# Patient Record
Sex: Female | Born: 1955 | Race: White | Hispanic: No | Marital: Married | Smoking: Never smoker
Health system: Southern US, Community
[De-identification: ages and names within clinical notes are randomized; demographics above are authoritative.]

## PROBLEM LIST (undated history)

## (undated) DIAGNOSIS — Z9109 Other allergy status, other than to drugs and biological substances: Secondary | ICD-10-CM

## (undated) DIAGNOSIS — L853 Xerosis cutis: Secondary | ICD-10-CM

## (undated) DIAGNOSIS — J9621 Acute and chronic respiratory failure with hypoxia: Secondary | ICD-10-CM

## (undated) DIAGNOSIS — R3915 Urgency of urination: Secondary | ICD-10-CM

## (undated) DIAGNOSIS — K219 Gastro-esophageal reflux disease without esophagitis: Secondary | ICD-10-CM

## (undated) DIAGNOSIS — Z9889 Other specified postprocedural states: Secondary | ICD-10-CM

## (undated) DIAGNOSIS — E162 Hypoglycemia, unspecified: Secondary | ICD-10-CM

## (undated) DIAGNOSIS — R000 Tachycardia, unspecified: Secondary | ICD-10-CM

## (undated) DIAGNOSIS — I10 Essential (primary) hypertension: Secondary | ICD-10-CM

## (undated) DIAGNOSIS — M1990 Unspecified osteoarthritis, unspecified site: Secondary | ICD-10-CM

## (undated) DIAGNOSIS — U071 COVID-19: Secondary | ICD-10-CM

## (undated) DIAGNOSIS — J40 Bronchitis, not specified as acute or chronic: Secondary | ICD-10-CM

## (undated) DIAGNOSIS — R0602 Shortness of breath: Secondary | ICD-10-CM

## (undated) DIAGNOSIS — F32A Depression, unspecified: Secondary | ICD-10-CM

## (undated) DIAGNOSIS — Z86711 Personal history of pulmonary embolism: Secondary | ICD-10-CM

## (undated) DIAGNOSIS — E039 Hypothyroidism, unspecified: Secondary | ICD-10-CM

## (undated) DIAGNOSIS — A419 Sepsis, unspecified organism: Secondary | ICD-10-CM

## (undated) DIAGNOSIS — IMO0002 Reserved for concepts with insufficient information to code with codable children: Secondary | ICD-10-CM

## (undated) DIAGNOSIS — F329 Major depressive disorder, single episode, unspecified: Secondary | ICD-10-CM

## (undated) DIAGNOSIS — F410 Panic disorder [episodic paroxysmal anxiety] without agoraphobia: Secondary | ICD-10-CM

## (undated) DIAGNOSIS — J1282 Pneumonia due to coronavirus disease 2019: Secondary | ICD-10-CM

## (undated) DIAGNOSIS — J302 Other seasonal allergic rhinitis: Secondary | ICD-10-CM

## (undated) DIAGNOSIS — R6520 Severe sepsis without septic shock: Secondary | ICD-10-CM

## (undated) DIAGNOSIS — J189 Pneumonia, unspecified organism: Secondary | ICD-10-CM

## (undated) DIAGNOSIS — R112 Nausea with vomiting, unspecified: Secondary | ICD-10-CM

## (undated) HISTORY — DX: Panic disorder (episodic paroxysmal anxiety): F41.0

## (undated) HISTORY — DX: Reserved for concepts with insufficient information to code with codable children: IMO0002

## (undated) HISTORY — DX: Essential (primary) hypertension: I10

## (undated) HISTORY — PX: APPENDECTOMY: SHX54

## (undated) HISTORY — PX: VAGINAL HYSTERECTOMY: SUR661

## (undated) HISTORY — DX: Unspecified osteoarthritis, unspecified site: M19.90

## (undated) HISTORY — PX: HERNIA REPAIR: SHX51

## (undated) HISTORY — PX: CHOLECYSTECTOMY: SHX55

---

## 2007-08-02 ENCOUNTER — Ambulatory Visit (HOSPITAL_COMMUNITY): Admission: RE | Admit: 2007-08-02 | Discharge: 2007-08-02 | Payer: Self-pay | Admitting: General Practice

## 2009-09-03 ENCOUNTER — Ambulatory Visit: Payer: Self-pay | Admitting: Critical Care Medicine

## 2009-09-03 DIAGNOSIS — E785 Hyperlipidemia, unspecified: Secondary | ICD-10-CM | POA: Insufficient documentation

## 2009-09-03 DIAGNOSIS — R918 Other nonspecific abnormal finding of lung field: Secondary | ICD-10-CM | POA: Insufficient documentation

## 2009-09-03 DIAGNOSIS — IMO0002 Reserved for concepts with insufficient information to code with codable children: Secondary | ICD-10-CM | POA: Insufficient documentation

## 2009-09-03 DIAGNOSIS — I10 Essential (primary) hypertension: Secondary | ICD-10-CM | POA: Insufficient documentation

## 2009-09-03 DIAGNOSIS — F410 Panic disorder [episodic paroxysmal anxiety] without agoraphobia: Secondary | ICD-10-CM | POA: Insufficient documentation

## 2009-09-19 ENCOUNTER — Encounter: Payer: Self-pay | Admitting: Critical Care Medicine

## 2009-12-09 ENCOUNTER — Encounter: Admission: RE | Admit: 2009-12-09 | Discharge: 2009-12-09 | Payer: Self-pay | Admitting: Urology

## 2010-10-25 HISTORY — PX: OTHER SURGICAL HISTORY: SHX169

## 2010-12-22 ENCOUNTER — Telehealth (INDEPENDENT_AMBULATORY_CARE_PROVIDER_SITE_OTHER): Payer: Self-pay | Admitting: *Deleted

## 2010-12-31 NOTE — Progress Notes (Signed)
Summary: NEEDS LAST OV NOTE FAXED TO [phone]  Phone Note From Other Clinic   Caller: AMY Call For: WRIGHT Caller: AMY WITH DR Evelene Croon Call For: WRIGHT Summary of Call: AMY WITH DR Evelene Croon PHONED AND WOULD LIKE [facility] LAST OFFICE VISIT NOTES FAXED TO [phone] Initial call taken by: Vedia Coffer,  December 22, 2010 3:22 PM  Follow-up for Phone Call        Faxed note.//Juanita Follow-up by: Darletta Moll,  December 23, 2010 8:25 AM

## 2011-09-30 ENCOUNTER — Other Ambulatory Visit (HOSPITAL_COMMUNITY): Payer: Self-pay | Admitting: Family Medicine

## 2011-09-30 DIAGNOSIS — J984 Other disorders of lung: Secondary | ICD-10-CM

## 2011-10-07 ENCOUNTER — Encounter: Payer: Self-pay | Admitting: Pulmonary Disease

## 2011-10-07 ENCOUNTER — Ambulatory Visit (INDEPENDENT_AMBULATORY_CARE_PROVIDER_SITE_OTHER): Payer: Medicare Other | Admitting: Pulmonary Disease

## 2011-10-07 ENCOUNTER — Telehealth: Payer: Self-pay | Admitting: Critical Care Medicine

## 2011-10-07 VITALS — BP 130/82 | HR 83 | Temp 97.9°F | Ht 67.0 in | Wt 205.0 lb

## 2011-10-07 DIAGNOSIS — J984 Other disorders of lung: Secondary | ICD-10-CM

## 2011-10-07 NOTE — Telephone Encounter (Signed)
Error.Marie Chavez ° °

## 2011-10-07 NOTE — Progress Notes (Deleted)
Subjective:     Patient ID: Marie Chavez, female   DOB: 1956-06-18, 55 y.o.   MRN: [id number]  HPI   Review of Systems  Psychiatric/Behavioral: Positive for dysphoric mood. The patient is nervous/anxious.   Anxiety/Depression     Objective:   Physical Exam     Assessment:     ***    Plan:     ***

## 2011-10-07 NOTE — Patient Instructions (Signed)
Will get disk with PET scan imaging and call with results Will schedule breathing test (PFT) Follow up in 3 months

## 2011-10-07 NOTE — Assessment & Plan Note (Addendum)
She had left upper lobe mass first noted on chest imaging studies in 2008.  She has PET scan from 2008, 2010, and 2012 which are negative for malignant uptake.  There is no significant history of smoking.  I have asked her to get a copy of the disk for her most recent PET scan from earlier this month so that I can compare the images directly with prior studies.  Will also arrange for pulmonary function tests.  Explained that if there is still concern for possible malignancy then she would either need to have bronchoscopy with biopsy versus evaluation by thoracic surgery.  If in comparison to prior studies her most recent PET scan is equivocal regarding potential for malignancy, then she may need to radiographic monitoring.

## 2011-10-07 NOTE — Progress Notes (Signed)
Chief Complaint  Patient presents with  . Advice Only    Pt last seen PW 2010. Pt had CT done last week and her nodule was bigger.     History of Present Illness:  Marie Chavez is a 55 y.o. female for evaluation of abnormal CT chest.  She had an episode of bronchitis recently.  As a result she had a chest xray, and was noted to have a mass in the left upper lung.  She then had a CT chest.  This was compared to CT chest in 2010, and showed slight increase in size raising concern for slow growing malignancy.  As a result she had PET scan on 10/04/11 which is reported to not show malignant uptake with maximum SUV of 1.86 FDG.  The lesion was noted to be 2.5 x 2 cm.  She was evaluated in 2010 by Dr. Shan Chavez for same lesion.  At that time she had fallen and hurt her chest.  She had a chest xray which showed left lung mass.  PET scan from 08/28/09 showed 2.3 cm lobulated mass without malignant uptake.  This was compared to PET scan from 08/02/07.  This was also negative for malignant uptake.  There was concern that lesion could still represent slow growing malignancy, and she was advised to have re-evaluation by pulmonary medicine.  She currently denies any respiratory symptoms of cough, wheeze, sputum, chest pain, or hemoptysis.  She has not had recent fever, sweats, or weight loss.  She denies headaches, change in vision, or change in voice.  She used to work in housekeeping.  She is currently disabled.  There is no history of pneumonia or tuberculosis.  She is from [place], and denies any recent travel history.  She has a Emergency planning/management officer, but no other animal exposures.  She denies any recent sick exposures.  She tried smoking briefly as a teenager.  Past Medical History  Diagnosis Date  . Hypertension   . Panic disorder   . Herniated disc   . DJD (degenerative joint disease)     Past Surgical History  Procedure Date  . Cholecystectomy   . Vaginal hysterectomy   . Hernia repair       No current outpatient prescriptions on file prior to visit.    Allergies  Allergen Reactions  . Celecoxib     REACTION: heart rate increases, itching  . Prednisone     REACTION: Heart rate increases, itching    family history includes Breast cancer in her sister.   reports that she has never smoked. She does not have any smokeless tobacco history on file.  Review of Systems - 12 point ROS negative except above.  Blood pressure 130/82, pulse 83, temperature 97.9 F (36.6 C), temperature source Oral, height 5\' 7"  (1.702 m), weight 205 lb (92.987 kg), SpO2 91.00%. Body mass index is 32.11 kg/(m^2).  Physical Exam:  General - Obese HEENT - PERRLA, EOMI, no sinus tenderness, no oral exudate, no LAN, no thyromegaly Cardiac - s1s2 regular, no murmur Chest - good air entry, normal respiratory excursion, no wheeze/rales Abdomen - soft, non tender, no organomegaly Extremities - no e/c/c Neurologic - normal strength, CN intact Skin - no rashes Psychiatric - normal mood, behavior  Assessment/Plan:    Outpatient Encounter Prescriptions as of 10/07/2011  Medication Sig Dispense Refill  . atenolol (TENORMIN) 50 MG tablet Take 50 mg by mouth daily.        . clonazePAM (KLONOPIN) 1 MG tablet  Take 1 mg by mouth 2 (two) times daily as needed.        Marland Kitchen esomeprazole (NEXIUM) 40 MG capsule Take 40 mg by mouth daily before breakfast.        . estradiol (VIVELLE-DOT) 0.05 MG/24HR Place 1 patch onto the skin once a week.        Marland Kitchen imipramine (TOFRANIL) 50 MG tablet Take 50 mg by mouth 2 (two) times daily.        Marland Kitchen levothyroxine (SYNTHROID, LEVOTHROID) 175 MCG tablet Take 175 mcg by mouth daily.          Marie Chavez Pager:  [phone] 10/07/2011, 3:45 PM

## 2011-10-22 ENCOUNTER — Telehealth: Payer: Self-pay | Admitting: Pulmonary Disease

## 2011-10-22 NOTE — Telephone Encounter (Signed)
PET scan from [facility] reviewed.  No malignant uptake in left upper lobe lesion, and lesion measures about same size compared to PET scan from 2008.  Results d/w patient.  Will continue with plan for repeat CT chest in 3 months, but advised that biopsy is not needed at this time.

## 2012-01-10 ENCOUNTER — Ambulatory Visit: Payer: Medicare Other | Admitting: Pulmonary Disease

## 2012-01-18 ENCOUNTER — Ambulatory Visit: Payer: Medicare Other | Admitting: Pulmonary Disease

## 2012-02-08 ENCOUNTER — Ambulatory Visit: Payer: Medicare Other | Admitting: Pulmonary Disease

## 2013-10-15 ENCOUNTER — Telehealth: Payer: Self-pay | Admitting: Pulmonary Disease

## 2013-10-15 NOTE — Telephone Encounter (Signed)
Pt has been scheduled to see TP on 10/24/13 at 11:15am.

## 2013-10-24 ENCOUNTER — Ambulatory Visit (INDEPENDENT_AMBULATORY_CARE_PROVIDER_SITE_OTHER): Payer: Medicare Other | Admitting: Adult Health

## 2013-10-24 ENCOUNTER — Encounter (INDEPENDENT_AMBULATORY_CARE_PROVIDER_SITE_OTHER): Payer: Self-pay

## 2013-10-24 ENCOUNTER — Encounter: Payer: Self-pay | Admitting: Adult Health

## 2013-10-24 VITALS — BP 122/70 | HR 64 | Temp 97.3°F | Ht 65.0 in | Wt 211.2 lb

## 2013-10-24 DIAGNOSIS — J984 Other disorders of lung: Secondary | ICD-10-CM

## 2013-10-24 NOTE — Patient Instructions (Addendum)
Finish antibiotics.  Mucinex DM Twice daily  As needed  Cough, congestion  Fluids and rest  follow up for  CT chest next week.  follow up Dr. Craige Cotta  After CT  Please contact office for sooner follow up if symptoms do not improve or worsen or seek emergency care

## 2013-10-24 NOTE — Progress Notes (Signed)
Subjective:    Patient ID: Marie Chavez, female    DOB: Aug 25, 1956, 57 y.o.   MRN: [id number]  HPI 57 yo female   10/24/2013 ER follow up  Marie Chavez is a 57 y.o. female for evaluation of abnormal xray  10/24/2013 ER follow up  Was seen in ER  Last week for bronchitis , started on abx . CXR showed enlarging mass. Last CT chest was 06/2012 with slightly increased mass . Pt was seen last in our office 10/06/13 for abn CT scan  CT chest in 2010, and showed slight increase in size raising concern for slow growing malignancy. As a result she had PET scan on 10/04/11 which is reported to not show malignant uptake with maximum SUV of 1.86 FDG. The lesion was noted to be 2.5 x 2 cm.  She was evaluated in 2010 by Dr. Shan Levans for same lesion. At that time she had fallen and hurt her chest. She had a chest xray which showed left lung mass. PET scan from 08/28/09 showed 2.3 cm lobulated mass without malignant uptake. This was compared to PET scan from 08/02/07. This was also negative for malignant uptake.   She has CT set up for next week on 10/29/12 . No weight  Loss, hemopytisis , chest pain or orthopnea.  She is never smoker.     Review of Systems Constitutional:   No  weight loss, night sweats,  Fevers, chills, fatigue, or  lassitude.  HEENT:   No headaches,  Difficulty swallowing,  Tooth/dental problems, or  Sore throat,                No sneezing, itching, ear ache, nasal congestion, post nasal drip,   CV:  No chest pain,  Orthopnea, PND, swelling in lower extremities, anasarca, dizziness, palpitations, syncope.   GI  No heartburn, indigestion, abdominal pain, nausea, vomiting, diarrhea, change in bowel habits, loss of appetite, bloody stools.   Resp: No shortness of breath with exertion or at rest.  No excess mucus, no productive cough,  No non-productive cough,  No coughing up of blood.  No change in color of mucus.  No wheezing.  No chest wall deformity  Skin: no rash or  lesions.  GU: no dysuria, change in color of urine, no urgency or frequency.  No flank pain, no hematuria   MS:  No joint pain or swelling.  No decreased range of motion.  No back pain.  Psych:  No change in mood or affect. No depression or anxiety.  No memory loss.         Objective:   Physical Exam  GEN: A/Ox3; pleasant , NAD, well nourished   HEENT:  Klein/AT,  EACs-clear, TMs-wnl, NOSE-clear, THROAT-clear, no lesions, no postnasal drip or exudate noted.   NECK:  Supple w/ fair ROM; no JVD; normal carotid impulses w/o bruits; no thyromegaly or nodules palpated; no lymphadenopathy.  RESP  Clear  P & A; w/o, wheezes/ rales/ or rhonchi.no accessory muscle use, no dullness to percussion  CARD:  RRR, no m/r/g  , no peripheral edema, pulses intact, no cyanosis or clubbing.  GI:   Soft & nt; nml bowel sounds; no organomegaly or masses detected.  Musco: Warm bil, no deformities or joint swelling noted.   Neuro: alert, no focal deficits noted.    Skin: Warm, no lesions or rashes  06/2012 CT CHEST Central left upper lobe nodule today measures 2.7 x 2.1<BR>cm. Previously this measured 2.6 x 2.0  cm. It has slightly<BR>enlarged. Lungs are otherwise clear.<BR>      Assessment & Plan:

## 2013-10-24 NOTE — Assessment & Plan Note (Signed)
Will follow left lung mass on CT chest planned for next week.  After CT decide on next step with Dr. Craige Cotta    Plan   Finish antibiotics.  Mucinex DM Twice daily  As needed  Cough, congestion  Fluids and rest  follow up for  CT chest next week.  follow up Dr. Craige Cotta  After CT  Please contact office for sooner follow up if symptoms do not improve or worsen or seek emergency care

## 2013-10-28 NOTE — Progress Notes (Signed)
Reviewed and agree with assessment/plan. 

## 2013-10-29 DIAGNOSIS — J479 Bronchiectasis, uncomplicated: Secondary | ICD-10-CM | POA: Diagnosis not present

## 2013-10-29 DIAGNOSIS — R911 Solitary pulmonary nodule: Secondary | ICD-10-CM | POA: Diagnosis not present

## 2013-10-30 ENCOUNTER — Encounter: Payer: Self-pay | Admitting: Pulmonary Disease

## 2013-10-30 ENCOUNTER — Ambulatory Visit (INDEPENDENT_AMBULATORY_CARE_PROVIDER_SITE_OTHER): Payer: Medicare Other | Admitting: Pulmonary Disease

## 2013-10-30 VITALS — BP 122/82 | HR 74 | Temp 98.5°F | Ht 65.0 in | Wt 211.0 lb

## 2013-10-30 DIAGNOSIS — T465X5A Adverse effect of other antihypertensive drugs, initial encounter: Secondary | ICD-10-CM

## 2013-10-30 DIAGNOSIS — R058 Other specified cough: Secondary | ICD-10-CM

## 2013-10-30 DIAGNOSIS — T464X5A Adverse effect of angiotensin-converting-enzyme inhibitors, initial encounter: Secondary | ICD-10-CM

## 2013-10-30 DIAGNOSIS — R918 Other nonspecific abnormal finding of lung field: Secondary | ICD-10-CM

## 2013-10-30 DIAGNOSIS — R059 Cough, unspecified: Secondary | ICD-10-CM

## 2013-10-30 DIAGNOSIS — R05 Cough: Secondary | ICD-10-CM | POA: Diagnosis not present

## 2013-10-30 DIAGNOSIS — R222 Localized swelling, mass and lump, trunk: Secondary | ICD-10-CM | POA: Diagnosis not present

## 2013-10-30 NOTE — Progress Notes (Signed)
Chief Complaint  Patient presents with  . Pulmonary Nodule    Breathing is unchanged. Reports becoming short of breath when she bends over. Cough has been present for 4 weeks, productive of yellow/clear mucus.    History of Present Illness: Marie Chavez is a 58 y.o. female never smoker with lung mass.  I last saw Marie Chavez in December 2012 for lung mass.  She was mostly recently seen by Rexene Edison in December 2014.  This was after she had ER evaluation and CXR showed increased size of lung mass.  She has noticed a cough for the past several weeks.  She sometimes brings up clear to yellow sputum.  She denies fever, wheeze, or hemoptysis.  She feels sore in her ribs from coughing.  She reports that she was started on lisinopril one month ago, and it was shortly after that when she started to develop a cough.  She had CT chest from 10/29/13 which showed some increase in size of Lt perihilar mass.  TESTS: CT chest 06/19/07 >> 1.6 cm Lt upper lobe nodule PET scan 08/02/07 >> no malignant uptake PET scan 08/28/09 >> 2.3 cm Lt perihilar mass w/o malignant uptake PET scan 10/04/11 >> 2.5 x 2 cm Lt perihilar mass w/o malignant uptake CT chest 10/29/13 >> 2.6 cm Lt perihilar smooth mass, LUL BTX with plugging and ATX, 3 mm RLL nodule  Marie Chavez  has a past medical history of Hypertension; Panic disorder; Herniated disc; and DJD (degenerative joint disease).  Marie Chavez  has past surgical history that includes Cholecystectomy; Vaginal hysterectomy; and Hernia repair.  Prior to Admission medications   Medication Sig Start Date End Date Taking? Authorizing Provider  atenolol (TENORMIN) 50 MG tablet Take 50 mg by mouth daily.     Yes Historical Provider, MD  clonazePAM (KLONOPIN) 1 MG tablet Take 1 mg by mouth 2 (two) times daily as needed.     Yes Historical Provider, MD  EPIPEN 2-PAK 0.3 MG/0.3ML SOAJ injection As needed 10/11/13  Yes Historical Provider, MD  escitalopram (LEXAPRO)  10 MG tablet Once daily 10/02/13  Yes Historical Provider, MD  esomeprazole (NEXIUM) 40 MG capsule Take 40 mg by mouth daily before breakfast.     Yes Historical Provider, MD  estradiol (VIVELLE-DOT) 0.05 MG/24HR Place 1 patch onto the skin once a week.     Yes Historical Provider, MD  fluticasone Asencion Islam) 50 MCG/ACT nasal spray As needed 10/13/13  Yes Historical Provider, MD  gabapentin (NEURONTIN) 300 MG capsule One at bedtime 10/02/13  Yes Historical Provider, MD  imipramine (TOFRANIL) 50 MG tablet Take 50 mg by mouth 2 (two) times daily.     Yes Historical Provider, MD  levothyroxine (SYNTHROID, LEVOTHROID) 175 MCG tablet Take 175 mcg by mouth daily.     Yes Historical Provider, MD  lisinopril (PRINIVIL,ZESTRIL) 5 MG tablet Once daily 09/06/13  Yes Historical Provider, MD  nabumetone (RELAFEN) 750 MG tablet As needed 10/10/13  Yes Historical Provider, MD    Allergies  Allergen Reactions  . Celecoxib     REACTION: heart rate increases, itching  . Prednisone     REACTION: Heart rate increases, itching     Physical Exam:  General - No distress ENT - No sinus tenderness, no oral exudate, no LAN Cardiac - s1s2 regular, no murmur Chest - No wheeze/rales/dullness Back - No focal tenderness Abd - Soft, non-tender Ext - No edema Neuro - Normal strength Skin - No rashes Psych - normal  mood, and behavior   Assessment/Plan:  Chesley Mires, MD [place] [facility] Pager:  [phone]

## 2013-10-30 NOTE — Patient Instructions (Signed)
Talk to your primary doctor about stopping lisinopril with concern for cough Will schedule PET scan and call with results Follow up in 6 weeks

## 2013-11-06 DIAGNOSIS — M5030 Other cervical disc degeneration, unspecified cervical region: Secondary | ICD-10-CM | POA: Diagnosis not present

## 2013-11-06 DIAGNOSIS — M9981 Other biomechanical lesions of cervical region: Secondary | ICD-10-CM | POA: Diagnosis not present

## 2013-11-06 DIAGNOSIS — M999 Biomechanical lesion, unspecified: Secondary | ICD-10-CM | POA: Diagnosis not present

## 2013-11-06 DIAGNOSIS — M5137 Other intervertebral disc degeneration, lumbosacral region: Secondary | ICD-10-CM | POA: Diagnosis not present

## 2013-11-07 DIAGNOSIS — R05 Cough: Secondary | ICD-10-CM | POA: Insufficient documentation

## 2013-11-07 DIAGNOSIS — T464X5A Adverse effect of angiotensin-converting-enzyme inhibitors, initial encounter: Secondary | ICD-10-CM

## 2013-11-07 NOTE — Assessment & Plan Note (Signed)
She has developed persistent, dry cough since being started on lisinopril.  I explained to her how ACE inhibitors can be cause of her cough, and advised her to d/w her PCP about alternative anti-hypertensive medications.

## 2013-11-07 NOTE — Assessment & Plan Note (Signed)
She has left perihilar lesion first noticed on CT chest from 2008 >> 1.6 cm at that time.  Most recent CT chest imaging shows increase in size to 2.6 cm.  Will repeat PET scan.  Explained she will need tissue sampling at a minimal, and possible need thoracic surgery evaluation for resection of lesion.  Will eventually need PFT's to further assess lung function prior to thoracic surgery evaluation.  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

## 2013-11-08 ENCOUNTER — Telehealth: Payer: Self-pay | Admitting: Pulmonary Disease

## 2013-11-08 ENCOUNTER — Ambulatory Visit (HOSPITAL_COMMUNITY)
Admission: RE | Admit: 2013-11-08 | Discharge: 2013-11-08 | Disposition: A | Discharge status: Home / Self Care | Payer: Medicare Other | Source: Ambulatory Visit | Attending: Pulmonary Disease | Admitting: Pulmonary Disease

## 2013-11-08 DIAGNOSIS — R222 Localized swelling, mass and lump, trunk: Secondary | ICD-10-CM | POA: Insufficient documentation

## 2013-11-08 DIAGNOSIS — R918 Other nonspecific abnormal finding of lung field: Secondary | ICD-10-CM

## 2013-11-08 DIAGNOSIS — R911 Solitary pulmonary nodule: Secondary | ICD-10-CM | POA: Diagnosis not present

## 2013-11-08 LAB — GLUCOSE, CAPILLARY: Glucose-Capillary: 89 mg/dL (ref 70–99)

## 2013-11-08 MED ORDER — FLUDEOXYGLUCOSE F - 18 (FDG) INJECTION: 17.3000 | Freq: Once | INTRAVENOUS | Status: AC | PRN

## 2013-11-08 NOTE — Telephone Encounter (Signed)
11/08/2013    CLINICAL DATA:  Initial treatment strategy for lung mass.   EXAM: NUCLEAR MEDICINE PET SKULL BASE TO THIGH   FASTING BLOOD GLUCOSE:  Value: 89mg /dl   TECHNIQUE: 17.3 mCi F-18 FDG was injected intravenously. CT data was obtained and used for attenuation correction and anatomic localization only. (This was not acquired as a diagnostic CT examination.) Additional exam technical data entered on technologist worksheet. COMPARISON:  NM PET IMAGE SKULL BASE TO THIGH dated 08/02/2007; CT CHEST W/O CM dated 10/29/2013; CT CHEST W/O CM dated 07/11/2012; NM PET TUM IMG SKULL BASE T - THIGH dated 10/04/2011   FINDINGS:  NECK   No hypermetabolic lymph nodes in the neck.   CHEST   Rounded smoothly marginated left upper lobe nodule demonstrates very low metabolic activity SUV max 2.5. This nodule measures 22 x 20 mm (image 78, series 2) increased from 15 x 15 mm on PET-CT of 08/02/2007. Compared to the most recent CT scans the lesion actually measures smaller which may be due to differing technique. This recent CT scan measured lesion at 26 x 21 mm (11/08/2013). Small peripheral nodule in the left upper lobe measures 6 mm compared to 11 mm on most recent CT scan. This does not have measurable metabolic activity.  There are no hypermetabolic mediastinal lymph nodes.   ABDOMEN/PELVIS   No abnormal hypermetabolic activity within the liver, pancreas, adrenal glands, or spleen. No hypermetabolic lymph nodes in the abdomen or pelvis.   SKELETON   No focal hypermetabolic activity to suggest skeletal metastasis.   IMPRESSION:  1. Very low metabolic activity associated with the enlarging left upper lobe rounded nodule. 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  2. Small peripheral nodule in the left upper lobe has no metabolic activity and is new from the remote PET scan.  3. No mediastinal hypermetabolic lymph nodes or distant metastasis.    Electronically Signed   By: Suzy Bouchard M.D.   On:  11/08/2013 11:47    Results d/w pt over phone.  Will arrange for PFT and referral to thoracic surgery.

## 2013-11-13 DIAGNOSIS — R05 Cough: Secondary | ICD-10-CM | POA: Diagnosis not present

## 2013-11-13 DIAGNOSIS — T8160XA Unspecified acute reaction to foreign substance accidentally left during a procedure, initial encounter: Secondary | ICD-10-CM | POA: Diagnosis not present

## 2013-11-13 DIAGNOSIS — I10 Essential (primary) hypertension: Secondary | ICD-10-CM | POA: Diagnosis not present

## 2013-11-13 DIAGNOSIS — R059 Cough, unspecified: Secondary | ICD-10-CM | POA: Diagnosis not present

## 2013-11-14 ENCOUNTER — Ambulatory Visit (INDEPENDENT_AMBULATORY_CARE_PROVIDER_SITE_OTHER): Payer: Medicare Other | Admitting: Cardiothoracic Surgery

## 2013-11-14 ENCOUNTER — Encounter: Payer: Self-pay | Admitting: Cardiothoracic Surgery

## 2013-11-14 VITALS — BP 139/79 | HR 66 | Resp 18 | Ht 65.0 in | Wt 210.0 lb

## 2013-11-14 DIAGNOSIS — R911 Solitary pulmonary nodule: Secondary | ICD-10-CM

## 2013-11-14 DIAGNOSIS — J984 Other disorders of lung: Secondary | ICD-10-CM

## 2013-11-14 NOTE — Progress Notes (Signed)
[id number]       , [id number]             [phone]                    Amneet M Harkey [place] Medical Record #[id number] Date of Birth: 21-Mar-1956  Referring: Chesley Mires, MD Primary Care: Christ Kick, MD  Chief Complaint:    Chief Complaint  Patient presents with  . Lung Lesion    eval and treat.Marland KitchenMarland KitchenPET 10/29/13    History of Present Illness:    Marie Chavez 58 y.o. female is seen in the office  today for a slowly enlarging left upper lobe central lung mass at the request of Dr. Llana Aliment. The patient notes a recent upper respiratory infection and at the time of evaluation a chest x-ray was done in the left upper lobe lung mass was noted patient was referred for further evaluation. With the upper respiratory infection 3-4 weeks ago she had some blood tinged sputum, she described as more streaks this is since cleared. She's had no weight loss, no fever chills. She's a nonsmoker.  The lesion in question has been present since 2008. At that time she fell in the tub at home suffering chest injury and a 1.5 cm left upper lobe lung mass was noted. She was referred to Dr. Lyda Jester ,  no biopsy was obtained and the patient was lost to follow up.  The repeat CT scans were performed in 2013 and 2012. A PET scan was performed in 2008.      Current Activity/ Functional Status:  Patient is independent with mobility/ambulation, transfers, ADL's, IADL's. patient is disabled because of chronic back pain  Zubrod Score: At the time of surgery this patient's most appropriate activity status/level should be described as: []  Normal activity, no symptoms [x]  Symptoms, fully ambulatory []  Symptoms, in bed less than or equal to 50% of the time []  Symptoms, in bed greater than 50% of the time but less than 100% []  Bedridden []  Moribund   Past Medical History  Diagnosis Date  . Hypertension   . Panic disorder   . Herniated disc   . DJD (degenerative  joint disease)     Past Surgical History  Procedure Laterality Date  . Cholecystectomy    . Vaginal hysterectomy    . Hernia repair      Family History  Problem Relation Age of Onset  . Breast cancer Sister    father died at age 56 with diabetes and renal failure, mother is alive at age 50 and "healthy". Patient has 6 sisters, one with history of breast cancer 2 with thyroid disease one with ovarian cancer one with brain aneurysm  History   Social History  . Marital Status: Married    Spouse Name: N/A    Number of Children: N/A  . Years of Education: N/A   Occupational History  . disabled    Social History Main Topics  . Smoking status: Never Smoker   . Smokeless tobacco: Not on file  . Alcohol Use: Not on file  . Drug Use: Not on file  . Sexual Activity: Not on file      History  Smoking status  . Never Smoker   Smokeless tobacco  . Not on file    History  Alcohol Use:  none      Allergies  Allergen Reactions  . Celecoxib  REACTION: heart rate increases, itching  . Prednisone     REACTION: Heart rate increases, itching    Current Outpatient Prescriptions  Medication Sig Dispense Refill  . atenolol (TENORMIN) 50 MG tablet Take 50 mg by mouth daily.        . clonazePAM (KLONOPIN) 1 MG tablet Take 1 mg by mouth 2 (two) times daily as needed.        Marland Kitchen EPIPEN 2-PAK 0.3 MG/0.3ML SOAJ injection As needed      . escitalopram (LEXAPRO) 10 MG tablet Once daily      . esomeprazole (NEXIUM) 40 MG capsule Take 40 mg by mouth daily before breakfast.        . estradiol (VIVELLE-DOT) 0.05 MG/24HR Place 1 patch onto the skin once a week.        . fluticasone (FLONASE) 50 MCG/ACT nasal spray As needed      . gabapentin (NEURONTIN) 300 MG capsule One at bedtime      . levothyroxine (SYNTHROID, LEVOTHROID) 175 MCG tablet Take 175 mcg by mouth daily.        Marland Kitchen lisinopril (PRINIVIL,ZESTRIL) 5 MG tablet Once daily      . nabumetone (RELAFEN) 750 MG tablet As needed         No current facility-administered medications for this visit.       Review of Systems:     Cardiac Review of Systems: Y or N  Chest Pain [ n   ]  Resting SOB [ n  ] Exertional SOB  [ y ]  Vertell Limber Florencio.Farrier  ]   Pedal Edema [ n  ]    Palpitations [n  ] Syncope  [n  ]   Presyncope [  n ]  General Review of Systems: [Y] = yes [  ]=no Constitional: recent weight change [n  ];  Wt loss over the last 3 months [  0 ] anorexia [  ]; fatigue [  ]; nausea [  ]; night sweats [ n ]; fever [ n ]; or chills [n  ];          Dental: poor dentition[ n ]; Last Dentist visit:   Eye : blurred vision [  ]; diplopia [   ]; vision changes [  ];  Amaurosis fugax[  ]; Resp: cough Blue.Reese  ];  wheezing[y  ];  hemoptysis[ y ]; shortness of breath[  ]; paroxysmal nocturnal dyspnea[  ]; dyspnea on exertion[ y ]; or orthopnea[  ];  GI:  gallstones[  ], vomiting[  ];  dysphagia[  ]; melena[  ];  hematochezia [  ]; heartburn[  ];   Hx of  Colonoscopy[y  ]; GU: kidney stones [  ]; hematuria[  ];   dysuria [  ];  nocturia[  ];  history of     obstruction [  ]; urinary frequency [  ]             Skin: rash, swelling[  ];, hair loss[  ];  peripheral edema[  ];  or itching[  ]; Musculosketetal: myalgias[  ];  joint swelling[ y ];  joint erythema[ y ];  joint pain[  ];  back pain[  ];  Heme/Lymph: bruising[  ];  bleeding[  ];  anemia[  ];  Neuro: TIA[  ];  headaches[  ];  stroke[  ];  vertigo[  ];  seizures[ n ];   paresthesias[n];  difficulty walking[n  ];  Psych:depression[  ]; anxiety[yes  ];  Endocrine:  diabetes[ n ];  thyroid dysfunction[y  ];  Immunizations: Flu up to date [ y ]; Pneumococcal up to date Blue.Reese  ];  Other:  Physical Exam: BP 139/79  Pulse 66  Resp 18  Ht 5\' 5"  (1.651 m)  Wt 210 lb (95.255 kg)  BMI 34.95 kg/m2  SpO2 97%    General appearance: alert, cooperative, appears stated age and no distress Neurologic: intact Heart: regular rate and rhythm, S1, S2 normal, no murmur, click, rub or gallop Lungs:  clear to auscultation bilaterally Abdomen: soft, non-tender; bowel sounds normal; no masses,  no organomegaly Extremities: extremities normal, atraumatic, no cyanosis or edema and Homans sign is negative, no sign of DVT Patient has no cervical or supraclavicular adenopathy, she has no carotid bruits, she has full DP and PT pulses bilaterally   Diagnostic Studies & Laboratory data:     Recent Radiology Findings:  On serial measurements by me CT scan 2008 August showed left upper lobe mass 16 mm in size, 2012 the mass and increased to 26 mm, 2013 was measured at 27.90mm, most recent scan in 2015 27 millimeters The PET scan in 2008 and 2015 show very low metabolic activity. She also has a new lesion peripherally left upper lobe. CT 2015: CT CHEST WITHOUT CONTRAST  TECHNIQUE: Multidetector CT imaging of the chest was performed following the standard protocol without IV contrast.  COMPARISON: DG CHEST 2V dated 10/13/2013; CT CHEST W/O CM dated 07/11/2012; CT CHEST W/ CM dated 09/27/2011; CT CHEST W/ CM dated 06/19/2007; NM PET IMAGE SKULL BASE TO THIGH dated 08/02/2007  FINDINGS: Left smoothly marginated suprahilar pulmonary nodule, 2.1 x 2.6 cm on image 20 of series 4, formerly 2.0 x 2.6 cm on the 2012 examination.  Tubular structure extending distally from this nodule probably represents with a plugged bronchiectatic airway and appears to branch with nodularity in the peripheral lung. The degree of bronchiectasis and the peripheral lobular branching head increased since the prior examination from 07/11/2012, and the posterior peripheral branching terminates in a 1.2 cm nodule (image 14 of series 4).  No pathologic thoracic adenopathy. 3 mm right lower lobe nodule, image 34 of series 4, no change from 09/27/2011, considered benign.  Old healed left rib fractures.  IMPRESSION: 1. Slow growing sharply marginated central left upper lobe pulmonary nodule has increased from 1.6 cm in  diameter in 2008, to 2.6 cm in diameter today. 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 There is worsening bronchiectasis of distal airways with new nodular enlargement of the terminal portions of these bronchiectatic airways. These should typically be resected - assuming histologic consultation by bronchoscopic biopsy, thoracic surgical consultation recommended.   Electronically Signed By: Sherryl Barters M.D. On: 10/29/2013 13:22     Nm Pet Image Initial (pi) Skull Base To Thigh  11/08/2013   CLINICAL DATA:  Initial treatment strategy for lung mass.  EXAM: NUCLEAR MEDICINE PET SKULL BASE TO THIGH  FASTING BLOOD GLUCOSE:  Value: 89mg /dl  TECHNIQUE: 17.3 mCi F-18 FDG was injected intravenously. CT data was obtained and used for attenuation correction and anatomic localization only. (This was not acquired as a diagnostic CT examination.) Additional exam technical data entered on technologist worksheet.  COMPARISON:  NM PET IMAGE SKULL BASE TO THIGH dated 08/02/2007; CT CHEST W/O CM dated 10/29/2013; CT CHEST W/O CM dated 07/11/2012; NM PET TUM IMG SKULL  BASE T - THIGH dated 10/04/2011  FINDINGS: NECK  No hypermetabolic lymph nodes in the neck.  CHEST  Rounded smoothly marginated left upper lobe nodule demonstrates very low metabolic activity SUV max 2.5. This nodule measures 22 x 20 mm (image 78, series 2) increased from 15 x 15 mm on PET-CT of 08/02/2007. Compared to the most recent CT scans the lesion actually measures smaller which may be due to differing technique. This recent CT scan measured lesion at 26 x 21 mm (11/08/2013). Small peripheral nodule in the left upper lobe measures 6 mm compared to 11 mm on most recent CT scan. This does not have measurable metabolic activity.  There are no hypermetabolic mediastinal lymph nodes.   ABDOMEN/PELVIS  No abnormal hypermetabolic activity within the liver, pancreas, adrenal glands, or spleen. No hypermetabolic lymph nodes in the abdomen or pelvis.  SKELETON  No focal hypermetabolic activity to suggest skeletal metastasis.  IMPRESSION: 1. Very low metabolic activity associated with the enlarging left upper lobe rounded nodule. 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 2. Small peripheral nodule in the left upper lobe has no metabolic activity and is new from the remote PET scan. 3. No mediastinal hypermetabolic lymph nodes or distant metastasis.   Electronically Signed   By: Suzy Bouchard M.D.   On: 11/08/2013 11:47    CT 2008: EXAM: Chest CT Date of exam: 06/19/2007 12:06:00 PM Reason for exam: CHEST LUMP, chest x-ray abnormality (not at this institution) Comparison exam: None Contrast enhancement: 100cc Optiray 350 IV.  16 mm soft tissue nodule seen adjacent to the left upper lobe bronchus. No other significant  pulmonary nodules/masses or adenopathy is identified. No acute pulmonary infiltrate. No pleural effusion. No mediastinal adenopathy. Normal cardiac size.  IMPRESSION: 16 mm soft tissue nodule seen in the left perihilar immediately adjacent to the left upper lobe  bronchus. This is of uncertain etiology and may represent prominent lymph node, however other  etiologies cannot be completely ruled out. Two-month follow-up CT recommended if no additional evaluation is clinically warranted. REPORTED/SIGNED BY: Irving Copas MD  SIGN DATE/TIME: 06/19/07 1529  CT 2012 CT CHEST WITH CONTRAST  Technique: Multidetector CT imaging of the chest was performed following the standard protocol during bolus administration of intravenous contrast.  Contrast: 50 ml Isovue 370.  Comparison: 06/19/2007.  Findings: No pathologically enlarged mediastinal, hilar or axillary lymph nodes. Heart size normal. No pericardial effusion.  Left upper lobe nodule has  enlarged, now measuring 2.6 x 2.0 cm (previously 1.7 x 1.3 cm). No new nodules. Lungs are otherwise clear. No pleural fluid. Airway is unremarkable.  Incidental imaging of the upper abdomen shows somewhat decreased attenuation throughout the visualized portion of the liver, with some areas of peripheral sparing. No worrisome lytic or sclerotic lesions.  IMPRESSION:  1. Slowly enlarging left upper lobe nodule, highly worrisome for indolent primary bronchogenic carcinoma. If this is a non-small cell lung cancer, imaging findings favor T1bN0M0 or stage IA disease. These results will be called to the ordering clinician or representative by the Radiologist Assistant, and communication documented in the PACS Dashboard. 2. Question hepatic steatosis.  CT 2013: CT CHEST WITHOUT CONTRAST  Technique: Multidetector CT imaging of the chest was performed following the standard protocol without IV contrast.  Comparison: 09/27/2011  Findings: Central left upper lobe nodule today measures 2.7 x 2.1 cm. Previously this measured 2.6 x 2.0 cm. It has slightly enlarged. Lungs are otherwise clear.  No obvious abnormal adenopathy. No pericardial effusion.  Mild atherosclerotic changes of the aorta.  Post cholecystectomy.  No pneumothorax and no pleural effusion.  Chronic left-sided rib deformities.  IMPRESSION: Left upper lobe nodule has slightly enlarged compared to a CT from December 2012. Slow-growing neoplasm is not excluded. Consider tissue diagnosis.   Original Report Authenticated By: Jamas Lav, M.D.     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 I discussed the left upper lobe lung mass that has been slowly increasing in size since 2008.  It is slow-growing and not hypermetabolic on PET scan. Likely diagnosis of carcinoid but to date no tissue diagnosis has been obtained. I've discussed with the patient the option of surgical resection both for treatment and diagnosis. She has had no formal pulmonary function studies performed, I will order these today and see her back in one to 2 weeks to further discuss the surgical treatment of her left upper lobe lung lesion.     I spent 50 minutes counseling the patient face to face. The total time spent in the appointment was 80 minutes.  Grace Isaac MD      [address] [place] [address] Office [phone]   Beeper [phone]  11/14/2013 5:49 PM

## 2013-11-15 ENCOUNTER — Other Ambulatory Visit: Payer: Self-pay | Admitting: *Deleted

## 2013-11-15 DIAGNOSIS — M999 Biomechanical lesion, unspecified: Secondary | ICD-10-CM | POA: Diagnosis not present

## 2013-11-15 DIAGNOSIS — M5030 Other cervical disc degeneration, unspecified cervical region: Secondary | ICD-10-CM | POA: Diagnosis not present

## 2013-11-15 DIAGNOSIS — M9981 Other biomechanical lesions of cervical region: Secondary | ICD-10-CM | POA: Diagnosis not present

## 2013-11-15 DIAGNOSIS — R918 Other nonspecific abnormal finding of lung field: Secondary | ICD-10-CM

## 2013-11-15 DIAGNOSIS — M5137 Other intervertebral disc degeneration, lumbosacral region: Secondary | ICD-10-CM | POA: Diagnosis not present

## 2013-11-20 ENCOUNTER — Ambulatory Visit (HOSPITAL_COMMUNITY)
Admission: RE | Admit: 2013-11-20 | Discharge: 2013-11-20 | Disposition: A | Discharge status: Home / Self Care | Payer: Medicare Other | Source: Ambulatory Visit | Attending: Pulmonary Disease | Admitting: Pulmonary Disease

## 2013-11-20 DIAGNOSIS — R222 Localized swelling, mass and lump, trunk: Secondary | ICD-10-CM | POA: Diagnosis not present

## 2013-11-20 DIAGNOSIS — R0609 Other forms of dyspnea: Secondary | ICD-10-CM | POA: Diagnosis not present

## 2013-11-20 DIAGNOSIS — R0989 Other specified symptoms and signs involving the circulatory and respiratory systems: Secondary | ICD-10-CM | POA: Diagnosis not present

## 2013-11-20 DIAGNOSIS — R918 Other nonspecific abnormal finding of lung field: Secondary | ICD-10-CM

## 2013-11-20 LAB — PULMONARY FUNCTION TEST
DL/VA % pred: 138 %
DL/VA: 6.83 ml/min/mmHg/L
DLCO cor % pred: 92 %
DLCO cor: 23.77 ml/min/mmHg
DLCO unc % pred: 92 %
DLCO unc: 23.77 ml/min/mmHg
FEF 25-75 Post: 1.69 L/sec
FEF 25-75 Pre: 2.41 L/sec
FEF2575-%Change-Post: -29 %
FEF2575-%Pred-Post: 65 %
FEF2575-%Pred-Pre: 94 %
FEV1-%Change-Post: -7 %
FEV1-%Pred-Post: 67 %
FEV1-%Pred-Pre: 72 %
FEV1-Post: 1.86 L
FEV1-Pre: 2 L
FEV1FVC-%Change-Post: -4 %
FEV1FVC-%Pred-Pre: 107 %
FEV6-%Change-Post: -3 %
FEV6-%Pred-Post: 66 %
FEV6-%Pred-Pre: 69 %
FEV6-Post: 2.28 L
FEV6-Pre: 2.36 L
FEV6FVC-%Pred-Post: 103 %
FEV6FVC-%Pred-Pre: 103 %
FVC-%Change-Post: -3 %
FVC-%Pred-Post: 64 %
FVC-%Pred-Pre: 66 %
FVC-Post: 2.28 L
FVC-Pre: 2.36 L
Post FEV1/FVC ratio: 81 %
Post FEV6/FVC ratio: 100 %
Pre FEV1/FVC ratio: 85 %
Pre FEV6/FVC Ratio: 100 %
RV % pred: 76 %
RV: 1.5 L
TLC % pred: 77 %
TLC: 4.02 L

## 2013-11-20 MED ORDER — ALBUTEROL SULFATE (2.5 MG/3ML) 0.083% IN NEBU
2.5000 mg | INHALATION_SOLUTION | Freq: Once | RESPIRATORY_TRACT | Status: AC
  Administered 2013-11-20: 2.5 mg via RESPIRATORY_TRACT

## 2013-11-22 ENCOUNTER — Encounter (HOSPITAL_COMMUNITY): Payer: Self-pay | Admitting: Pharmacy Technician

## 2013-11-22 ENCOUNTER — Encounter: Payer: Self-pay | Admitting: Cardiothoracic Surgery

## 2013-11-22 ENCOUNTER — Encounter (HOSPITAL_COMMUNITY): Payer: Medicare Other

## 2013-11-22 ENCOUNTER — Other Ambulatory Visit: Payer: Self-pay | Admitting: *Deleted

## 2013-11-22 ENCOUNTER — Ambulatory Visit (INDEPENDENT_AMBULATORY_CARE_PROVIDER_SITE_OTHER): Payer: Medicare Other | Admitting: Cardiothoracic Surgery

## 2013-11-22 VITALS — BP 146/86 | HR 63 | Resp 16 | Ht 65.0 in | Wt 210.0 lb

## 2013-11-22 DIAGNOSIS — R911 Solitary pulmonary nodule: Secondary | ICD-10-CM

## 2013-11-22 DIAGNOSIS — R918 Other nonspecific abnormal finding of lung field: Secondary | ICD-10-CM

## 2013-11-22 DIAGNOSIS — J984 Other disorders of lung: Secondary | ICD-10-CM | POA: Diagnosis not present

## 2013-11-22 NOTE — Progress Notes (Signed)
[address]       [place], [address]             [phone]                        Kaily M Quillin [place] Medical Record #[id number] Date of Birth: 1956/09/16  Referring: Chesley Mires, MD Primary Care: Christ Kick, MD  Chief Complaint:   Left lung mass   History of Present Illness:    Marie Chavez 58 y.o. female is seen in the office  today for a slowly enlarging left upper lobe central lung mass at the request of Dr. Llana Aliment. The patient notes a recent upper respiratory infection and at the time of evaluation a chest x-ray was done in the left upper lobe lung mass was noted patient was referred for further evaluation. With the upper respiratory infection 3-4 weeks ago she had some blood tinged sputum, she described as more streaks this is since cleared. She's had no weight loss, no fever chills. She's a nonsmoker.  The lesion in question has been present since 2008. At that time she fell in the tub at home suffering chest injury and a 1.5 cm left upper lobe lung mass was noted. She was referred to Dr. Lyda Jester ,  no biopsy was obtained and the patient was lost to follow up.  The repeat CT scans were performed in 2013 and 2012. A PET scan was performed in 2008.   Patient returns to the office today to further discuss treatment options and to review her pulmonary function studies which were obtained last week.  Current Activity/ Functional Status:  Patient is independent with mobility/ambulation, transfers, ADL's, IADL's. patient is disabled because of chronic back pain  Zubrod Score: At the time of surgery this patient's most appropriate activity status/level should be described as: []  Normal activity, no symptoms [x]  Symptoms, fully ambulatory []  Symptoms, in bed less than or equal to 50% of the time []  Symptoms, in bed greater than 50% of the time but less than 100% []  Bedridden []  Moribund   Past Medical History  Diagnosis Date    . Hypertension   . Panic disorder   . Herniated disc   . DJD (degenerative joint disease)     Past Surgical History  Procedure Laterality Date  . Cholecystectomy    . Vaginal hysterectomy    . Hernia repair      Family History  Problem Relation Age of Onset  . Breast cancer Sister    father died at age 24 with diabetes and renal failure, mother is alive at age 40 and "healthy". Patient has 6 sisters, one with history of breast cancer 2 with thyroid disease one with ovarian cancer one with brain aneurysm  History   Social History  . Marital Status: Married    Spouse Name: N/A    Number of Children: N/A  . Years of Education: N/A   Occupational History  . disabled    Social History Main Topics  . Smoking status: Never Smoker   . Smokeless tobacco: Not on file  . Alcohol Use: Not on file  . Drug Use: Not on file  . Sexual Activity: Not on file      History  Smoking status  . Never Smoker   Smokeless tobacco  . Not on file    History  Alcohol Use:  none  Allergies  Allergen Reactions  . Celebrex [Celecoxib]     REACTION: heart rate increases, itching  . Mobic [Meloxicam] Itching    HEART RACES  . Prednisone     REACTION: Heart rate increases, itching    Current Outpatient Prescriptions  Medication Sig Dispense Refill  . atenolol (TENORMIN) 50 MG tablet Take 50 mg by mouth daily.        . clonazePAM (KLONOPIN) 1 MG tablet Take 1 mg by mouth 2 (two) times daily as needed.        Marland Kitchen EPIPEN 2-PAK 0.3 MG/0.3ML SOAJ injection As needed      . escitalopram (LEXAPRO) 10 MG tablet Once daily      . esomeprazole (NEXIUM) 40 MG capsule Take 40 mg by mouth daily before breakfast.        . estradiol (VIVELLE-DOT) 0.05 MG/24HR Place 1 patch onto the skin once a week.        . fluticasone (FLONASE) 50 MCG/ACT nasal spray As needed      . gabapentin (NEURONTIN) 300 MG capsule One at bedtime      . levothyroxine (SYNTHROID, LEVOTHROID) 175 MCG tablet Take 175 mcg  by mouth daily.        Marland Kitchen lisinopril (PRINIVIL,ZESTRIL) 5 MG tablet Once daily      . nabumetone (RELAFEN) 750 MG tablet As needed       No current facility-administered medications for this visit.       Review of Systems:     Cardiac Review of Systems: Y or N  Chest Pain [ n   ]  Resting SOB [ n  ] Exertional SOB  [ y ]  Vertell Limber Florencio.Farrier  ]   Pedal Edema [ n  ]    Palpitations [n  ] Syncope  [n  ]   Presyncope [  n ]  General Review of Systems: [Y] = yes [  ]=no Constitional: recent weight change [n  ];  Wt loss over the last 3 months [  0 ] anorexia [  ]; fatigue [  ]; nausea [  ]; night sweats [ n ]; fever [ n ]; or chills [n  ];          Dental: poor dentition[ n ]; Last Dentist visit:   Eye : blurred vision [  ]; diplopia [   ]; vision changes [  ];  Amaurosis fugax[  ]; Resp: cough Blue.Reese  ];  wheezing[y  ];  hemoptysis[ y ]; shortness of breath[  ]; paroxysmal nocturnal dyspnea[  ]; dyspnea on exertion[ y ]; or orthopnea[  ];  GI:  gallstones[  ], vomiting[  ];  dysphagia[  ]; melena[  ];  hematochezia [  ]; heartburn[  ];   Hx of  Colonoscopy[y  ]; GU: kidney stones [  ]; hematuria[  ];   dysuria [  ];  nocturia[  ];  history of     obstruction [  ]; urinary frequency [  ]             Skin: rash, swelling[  ];, hair loss[  ];  peripheral edema[  ];  or itching[  ]; Musculosketetal: myalgias[  ];  joint swelling[ y ];  joint erythema[ y ];  joint pain[  ];  back pain[  ];  Heme/Lymph: bruising[  ];  bleeding[  ];  anemia[  ];  Neuro: TIA[  ];  headaches[  ];  stroke[  ];  vertigo[  ];  seizures[ n ];   paresthesias[n];  difficulty walking[n  ];  Psych:depression[  ]; anxiety[yes  ];  Endocrine: diabetes[ n ];  thyroid dysfunction[y  ];  Immunizations: Flu up to date [ y ]; Pneumococcal up to date Blue.Reese  ];  Other:  Physical Exam: BP 146/86  Pulse 63  Resp 16  Ht 5\' 5"  (1.651 m)  Wt 210 lb (95.255 kg)  BMI 34.95 kg/m2  SpO2 97%    General appearance: alert, cooperative, appears  stated age and no distress Neurologic: intact Heart: regular rate and rhythm, S1, S2 normal, no murmur, click, rub or gallop Lungs: clear to auscultation bilaterally Abdomen: soft, non-tender; bowel sounds normal; no masses,  no organomegaly Extremities: extremities normal, atraumatic, no cyanosis or edema and Homans sign is negative, no sign of DVT Patient has no cervical or supraclavicular adenopathy, she has no carotid bruits, she has full DP and PT pulses bilaterally   Diagnostic Studies & Laboratory data:     Recent Radiology Findings:  On serial measurements by me CT scan 2008 August showed left upper lobe mass 16 mm in size, 2012 the mass and increased to 26 mm, 2013 was measured at 27.48mm, most recent scan in 2015 27 millimeters The PET scan in 2008 and 2015 show very low metabolic activity. She also has a new lesion peripherally left upper lobe. CT 2015: CT CHEST WITHOUT CONTRAST  TECHNIQUE: Multidetector CT imaging of the chest was performed following the standard protocol without IV contrast.  COMPARISON: DG CHEST 2V dated 10/13/2013; CT CHEST W/O CM dated 07/11/2012; CT CHEST W/ CM dated 09/27/2011; CT CHEST W/ CM dated 06/19/2007; NM PET IMAGE SKULL BASE TO THIGH dated 08/02/2007  FINDINGS: Left smoothly marginated suprahilar pulmonary nodule, 2.1 x 2.6 cm on image 20 of series 4, formerly 2.0 x 2.6 cm on the 2012 examination.  Tubular structure extending distally from this nodule probably represents with a plugged bronchiectatic airway and appears to branch with nodularity in the peripheral lung. The degree of bronchiectasis and the peripheral lobular branching head increased since the prior examination from 07/11/2012, and the posterior peripheral branching terminates in a 1.2 cm nodule (image 14 of series 4).  No pathologic thoracic adenopathy. 3 mm right lower lobe nodule, image 34 of series 4, no change from 09/27/2011, considered benign.  Old healed left rib  fractures.  IMPRESSION: 1. Slow growing sharply marginated central left upper lobe pulmonary nodule has increased from 1.6 cm in diameter in 2008, to 2.6 cm in diameter today. 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 There is worsening bronchiectasis of distal airways with new nodular enlargement of the terminal portions of these bronchiectatic airways. These should typically be resected - assuming histologic consultation by bronchoscopic biopsy, thoracic surgical consultation recommended.   Electronically Signed By: Sherryl Barters M.D. On: 10/29/2013 13:22     Nm Pet Image Initial (pi) Skull Base To Thigh  11/08/2013   CLINICAL DATA:  Initial treatment strategy for lung mass.  EXAM: NUCLEAR MEDICINE PET SKULL BASE TO THIGH  FASTING BLOOD GLUCOSE:  Value: 89mg /dl  TECHNIQUE: 17.3 mCi F-18 FDG was injected intravenously. CT data was obtained and used for attenuation correction and anatomic localization only. (This was not acquired as a diagnostic CT examination.) Additional exam technical data entered on technologist worksheet.  COMPARISON:  NM PET IMAGE SKULL BASE TO  THIGH dated 08/02/2007; CT CHEST W/O CM dated 10/29/2013; CT CHEST W/O CM dated 07/11/2012; NM PET TUM IMG SKULL BASE T - THIGH dated 10/04/2011  FINDINGS: NECK  No hypermetabolic lymph nodes in the neck.  CHEST  Rounded smoothly marginated left upper lobe nodule demonstrates very low metabolic activity SUV max 2.5. This nodule measures 22 x 20 mm (image 78, series 2) increased from 15 x 15 mm on PET-CT of 08/02/2007. Compared to the most recent CT scans the lesion actually measures smaller which may be due to differing technique. This recent CT scan measured lesion at 26 x 21 mm (11/08/2013). Small peripheral nodule in the left upper lobe measures 6 mm compared to 11  mm on most recent CT scan. This does not have measurable metabolic activity.  There are no hypermetabolic mediastinal lymph nodes.  ABDOMEN/PELVIS  No abnormal hypermetabolic activity within the liver, pancreas, adrenal glands, or spleen. No hypermetabolic lymph nodes in the abdomen or pelvis.  SKELETON  No focal hypermetabolic activity to suggest skeletal metastasis.  IMPRESSION: 1. Very low metabolic activity associated with the enlarging left upper lobe rounded nodule. 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 2. Small peripheral nodule in the left upper lobe has no metabolic activity and is new from the remote PET scan. 3. No mediastinal hypermetabolic lymph nodes or distant metastasis.   Electronically Signed   By: Suzy Bouchard M.D.   On: 11/08/2013 11:47    CT 2008: EXAM: Chest CT Date of exam: 06/19/2007 12:06:00 PM Reason for exam: CHEST LUMP, chest x-ray abnormality (not at this institution) Comparison exam: None Contrast enhancement: 100cc Optiray 350 IV.  16 mm soft tissue nodule seen adjacent to the left upper lobe bronchus. No other significant  pulmonary nodules/masses or adenopathy is identified. No acute pulmonary infiltrate. No pleural effusion. No mediastinal adenopathy. Normal cardiac size.  IMPRESSION: 16 mm soft tissue nodule seen in the left perihilar immediately adjacent to the left upper lobe  bronchus. This is of uncertain etiology and may represent prominent lymph node, however other  etiologies cannot be completely ruled out. Two-month follow-up CT recommended if no additional evaluation is clinically warranted. REPORTED/SIGNED BY: Irving Copas MD  SIGN DATE/TIME: 06/19/07 1529  CT 2012 CT CHEST WITH CONTRAST  Technique: Multidetector CT imaging of the chest was performed following the standard protocol during bolus administration of intravenous contrast.  Contrast: 50 ml Isovue 370.  Comparison: 06/19/2007.  Findings: No pathologically  enlarged mediastinal, hilar or axillary lymph nodes. Heart size normal. No pericardial effusion.  Left upper lobe nodule has enlarged, now measuring 2.6 x 2.0 cm (previously 1.7 x 1.3 cm). No new nodules. Lungs are otherwise clear. No pleural fluid. Airway is unremarkable.  Incidental imaging of the upper abdomen shows somewhat decreased attenuation throughout the visualized portion of the liver, with some areas of peripheral sparing. No worrisome lytic or sclerotic lesions.  IMPRESSION:  1. Slowly enlarging left upper lobe nodule, highly worrisome for indolent primary bronchogenic carcinoma. If this is a non-small cell lung cancer, imaging findings favor T1bN0M0 or stage IA disease. These results will be called to the ordering clinician or representative by the Radiologist Assistant, and communication documented in the PACS Dashboard. 2. Question hepatic steatosis.  CT 2013: CT CHEST WITHOUT CONTRAST  Technique: Multidetector CT imaging of the chest was performed following the standard protocol without IV contrast.  Comparison: 09/27/2011  Findings: Central left upper lobe nodule today measures 2.7 x 2.1 cm. Previously this measured  2.6 x 2.0 cm. It has slightly enlarged. Lungs are otherwise clear.  No obvious abnormal adenopathy. No pericardial effusion.  Mild atherosclerotic changes of the aorta.  Post cholecystectomy.  No pneumothorax and no pleural effusion.  Chronic left-sided rib deformities.  IMPRESSION: Left upper lobe nodule has slightly enlarged compared to a CT from December 2012. Slow-growing neoplasm is not excluded. Consider tissue diagnosis.   Original Report Authenticated By: Jamas Lav, M.D.     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 I discussed the left upper lobe lung mass that has been slowly increasing in size since 2008. It is slow-growing and not hypermetabolic on PET scan. Likely diagnosis of carcinoid but to date no tissue diagnosis has been obtained. The patient has adequate pulmonary function studies for resection. I recommended to her today that we proceed with bronchoscopy left video assisted thoracoscopy with lung resection probable lobectomy for a slowly enlarging left lung mass. Risks and options of surgery were discussed in detail. She is willing to proceed. We'll tentatively plan for Wednesday, February 4  Grace Isaac MD      [address] [place] ,[place] [address] Office [phone]   Beeper [phone]  11/22/2013 3:11 PM

## 2013-11-22 NOTE — Patient Instructions (Signed)
No lisinopril for 36 hours preop Lung Resection A lung resection is surgery to remove a lung. When an entire lung is removed, the procedure is called a pneumonectomy. When only part of a lung is removed, the procedure is called a lobectomy. A lung resection is typically done to get rid of a tumor or cancer. This surgery can help relieve some or all of your symptoms. The surgery can also help keep the problem from getting worse. It may provide the best chance for curing your disease. However, surgery may not necessarily cure lung cancer, if that is the problem. Most people need to stay in the hospital for several days after this procedure.  LET YOUR CAREGIVER KNOW ABOUT:  Allergies to food or medicine.  Medicines taken, including vitamins, herbs, eyedrops, over-the-counter medicines, and creams.  Use of steroids (by mouth or creams).  Previous problems with anesthetics or numbing medicines.  History of bleeding problems or blood clots.  Previous surgery.  Other health problems, including diabetes and kidney problems.  Possibility of pregnancy, if this applies. RISKS AND COMPLICATIONS  Lung resections have been done for many years with good results and few complications. However, all surgery is associated with possible risks. Some of these risks are:  Excessive bleeding.  Infection.  Inability to breath without a ventilator.  Persistent shortness of breath.  Heart problems, including abnormal rhythms and a risk of heart attack or heart failure.  Blood clots.  Injury to a blood vessel.  Injury to a nerve.  Failure to heal properly.  Stroke.  Bronchopleural fistula. This is a small hole between one of the main breathing tubes and the lining of the lungs. BEFORE THE PROCEDURE  In order to prepare for surgery, your caregiver may ask for several tests to be done. These may include:  Blood tests.  Urine tests.  X-rays.  Imaging tests, such as CT scans, MRI scans, and PET  scans. These tests are done to find the exact size and location of the tumor that will be removed.  Pulmonary function tests (PFTs). These are breathing tests to assess the function of your lungs before surgery and to decide how to best help your breathing after surgery.  Heart testing. This is done to make sure your heart is strong enough for the procedure.  Bronchoscopy. This is a technique that allows your caregiver to look at the inside of your airways. This is done using a soft, flexible tube (bronchoscope). Along with imaging tests, this can help your caregiver know the exact location and size of the area that will be removed during surgery.  Lymph node sampling. This may need to be done to see if the tumor has spread. It may be done as a separate surgery or right before your lung resection procedure. PROCEDURE  An intravenous line (IV) will be placed in your arm. You will be given medicine that makes you sleep (general anesthetic).  Once you are asleep, a breathing tube is placed into your windpipe. You may also get pain medicine through a thin, flexible tube (catheter) in your back. The catheter is put through your skin and next to your spinal cord, where it releases anesthetic medicine.  Next, you will be turned onto your side. This makes it easier for your surgeon to reach the area of your ribcage where the surgical cut (incision) will be made. This area is washed with a disinfectant solution and might also be shaved. A catheter will be put into your bladder to  collect urine. Another tube will be carefully passed through your throat and into your stomach.  The surgeon will make an incision on your side, which will start between two of your ribs and go around to your back. Your ribs will be spread and held open. Part of one rib may be removed to make it easier for the surgeon to reach your lung.  Your surgeon will carefully cut the veins, arteries, and bronchus leading to the lung. After  being cut, each of these pieces will be sewn or stapled closed. Then, the lung or part of the lung will be removed.  Your surgeon will check inside your chest to make sure there is no bleeding in or around the lungs. Lymph nodes near the lung may also be removed for later tests. This is done to check if your problems have spread to the lymph nodes.  Depending on your situation, your surgeon may put tubes into your chest to drain extra fluid and air from the chest cavity after surgery. After the tubes are in, your ribcage will be closed with stitches. The stitches help your ribcage heal and keep it from moving. After this, the layers of tissue under the skin are closed with more stitches, which will dissolve inside your body over time. Finally, your skin is closed with stitches or staples and covered with a bandage. AFTER THE PROCEDURE   After surgery, you will be taken to the recovery area where a nurse will monitor your progress. You may still have a breathing tube, spinal catheter, bladder catheter, stomach tube, and possibly chest tubes inside your body. These will be removed during your recovery. You may be put on a respirator following surgery if some assistance is needed to help your breathing. When you are awake, stable, and without complications, you will likely continue recovery in the intensive care unit (ICU).  As you wake up, you might feel some aches and pains in your chest and throat. Sometimes during recovery, patients may shiver or feel nauseous. Both of these symptoms are temporary and may be caused by the anesthesia. Your caregivers can give you medicine to help these problems go away.  The breathing tube will be taken out as soon as your caregivers feel you can breathe on your own. For most people, this happens on the same day as the surgery.  If your surgery and time in the ICU go well, most of the tubes and equipment will be taken out within the first 1 to 2 days after surgery. This  is about how long most people stay in the ICU. You may need to stay longer, depending on how you are doing.  You should also start respiratory therapy in the ICU. This therapy uses breathing exercises to help your other lung stay healthy and get stronger.  As you improve, you will be moved to a regular hospital room for continued respiratory therapy, help with your bladder and bowels, and to continue medicines. Most people stay in the hospital for 5 to 7 days. However, your stay may be longer, depending on how your surgery went and how well you are doing.  After your lung or part of your lung is taken out, there will be a space inside your chest. This space will often fill up with fluid over time. The amount of time this takes is different for each person. Because your chest needs to fill with fluid, your surgeon may or may not put a drainage tube in your  chest. If there is a chest tube, it will most likely be removed within 24 hours after the surgery.  You will receive care until you are doing well and your caregiver feels it is safe for you to go home or to transfer to an extended care facility. Document Released: 01/01/2003 Document Revised: 01/03/2012 Document Reviewed: 06/10/2011 [facility] Patient Information 2014 [place], [place]. Pulmonary Nodule A pulmonary nodule is a small, round growth of tissue in the lung. Pulmonary nodules can range in size from less than 1/5 inch (4 mm) to a little bigger than an inch (25 mm). Most pulmonary nodules are detected when imaging tests of the lung are being performed for a different problem. Pulmonary nodules are usually not cancerous (benign). However, some pulmonary nodules are cancerous (malignant). Follow-up treatment or testing is based on the size of the pulmonary nodule and your risk of getting lung cancer.  CAUSES Benign pulmonary nodules can be caused by various things. Some of the causes include:   Bacterial, fungal, or viral infections. This is  usually an old infection that is no longer active, but it can sometimes be a current, active infection.  A benign mass of tissue.  Inflammation from conditions such as rheumatoid arthritis.   Abnormal blood vessels in the lungs. Malignant pulmonary nodules can result from lung cancer or from cancers that spread to the lung from other places in the body. SIGNS AND SYMPTOMS Pulmonary nodules usually do not cause symptoms. DIAGNOSIS Most often, pulmonary nodules are found incidentally when an X-ray or CT scan is performed to look for some other problem in the lung area. To help determine whether a pulmonary nodule is benign or malignant, your health care provider will take a medical history and order a variety of tests. Tests done may include:   Blood tests.  A skin test called a tuberculin test. This test is used to determine if you have been exposed to the germ that causes tuberculosis.   Chest X-rays. If possible, a new X-ray may be compared with X-rays you have had in the past.   CT scan. This test shows smaller pulmonary nodules more clearly than an X-ray.   Positron emission tomography (PET) scan. In this test, a safe amount of a radioactive substance is injected into the bloodstream. Then, the scan takes a picture of the pulmonary nodule. The radioactive substance is eliminated from your body in your urine.   Biopsy. A tiny piece of the pulmonary nodule is removed so it can be checked under a microscope. TREATMENT  Pulmonary nodules that are benign normally do not require any treatment because they usually do not cause symptoms or breathing problems. Your health care provider may want to monitor the pulmonary nodule through follow-up CT scans. The frequency of these CT scans will vary based on the size of the nodule and the risk factors for lung cancer. For example, CT scans will need to be done more frequently if the pulmonary nodule is larger and if you have a history of smoking  and a family history of cancer. Further testing or biopsies may be done if any follow-up CT scan shows that the size of the pulmonary nodule has increased. HOME CARE INSTRUCTIONS  Only take over-the-counter or prescription medicines as directed by your health care provider.  Keep all follow-up appointments with your health care provider. SEEK MEDICAL CARE IF:  You have trouble breathing when you are active.   You feel sick or unusually tired.   You do  not feel like eating.   You lose weight without trying to.   You develop chills or night sweats.  SEEK IMMEDIATE MEDICAL CARE IF:  You cannot catch your breath, or you begin wheezing.   You cannot stop coughing.   You cough up blood.   You become dizzy or feel like you are going to pass out.   You have sudden chest pain.   You have a fever or persistent symptoms for more than 2 3 days.   You have a fever and your symptoms suddenly get worse. MAKE SURE YOU:  Understand these instructions.  Will watch your condition.  Will get help right away if you are not doing well or get worse. Document Released: 08/08/2009 Document Revised: 06/13/2013 Document Reviewed: 04/02/2013 [facility] Patient Information 2014 [place].

## 2013-11-27 ENCOUNTER — Ambulatory Visit (HOSPITAL_COMMUNITY)
Admission: RE | Admit: 2013-11-27 | Discharge: 2013-11-27 | Disposition: A | Discharge status: Home / Self Care | Payer: Medicare Other | Source: Ambulatory Visit | Attending: Cardiothoracic Surgery | Admitting: Cardiothoracic Surgery

## 2013-11-27 ENCOUNTER — Encounter (HOSPITAL_COMMUNITY): Payer: Self-pay

## 2013-11-27 ENCOUNTER — Encounter (HOSPITAL_COMMUNITY)
Admission: RE | Admit: 2013-11-27 | Discharge: 2013-11-27 | Disposition: A | Discharge status: Home / Self Care | Payer: Medicare Other | Source: Ambulatory Visit | Attending: Cardiothoracic Surgery | Admitting: Cardiothoracic Surgery

## 2013-11-27 VITALS — BP 150/52 | HR 77 | Temp 98.0°F | Resp 16 | Ht 65.0 in | Wt 212.3 lb

## 2013-11-27 DIAGNOSIS — R911 Solitary pulmonary nodule: Secondary | ICD-10-CM | POA: Diagnosis not present

## 2013-11-27 DIAGNOSIS — F410 Panic disorder [episodic paroxysmal anxiety] without agoraphobia: Secondary | ICD-10-CM | POA: Diagnosis not present

## 2013-11-27 DIAGNOSIS — D3A090 Benign carcinoid tumor of the bronchus and lung: Secondary | ICD-10-CM | POA: Diagnosis not present

## 2013-11-27 DIAGNOSIS — R222 Localized swelling, mass and lump, trunk: Secondary | ICD-10-CM | POA: Diagnosis not present

## 2013-11-27 DIAGNOSIS — R918 Other nonspecific abnormal finding of lung field: Secondary | ICD-10-CM

## 2013-11-27 DIAGNOSIS — Z01811 Encounter for preprocedural respiratory examination: Secondary | ICD-10-CM | POA: Diagnosis not present

## 2013-11-27 DIAGNOSIS — E039 Hypothyroidism, unspecified: Secondary | ICD-10-CM | POA: Diagnosis not present

## 2013-11-27 HISTORY — DX: Hypothyroidism, unspecified: E03.9

## 2013-11-27 HISTORY — DX: Pneumonia, unspecified organism: J18.9

## 2013-11-27 HISTORY — DX: Urgency of urination: R39.15

## 2013-11-27 HISTORY — DX: Gastro-esophageal reflux disease without esophagitis: K21.9

## 2013-11-27 HISTORY — DX: Shortness of breath: R06.02

## 2013-11-27 HISTORY — DX: Bronchitis, not specified as acute or chronic: J40

## 2013-11-27 HISTORY — DX: Other specified postprocedural states: Z98.890

## 2013-11-27 HISTORY — DX: Other specified postprocedural states: R11.2

## 2013-11-27 HISTORY — DX: Other seasonal allergic rhinitis: J30.2

## 2013-11-27 HISTORY — DX: Depression, unspecified: F32.A

## 2013-11-27 HISTORY — DX: Other allergy status, other than to drugs and biological substances: Z91.09

## 2013-11-27 HISTORY — DX: Major depressive disorder, single episode, unspecified: F32.9

## 2013-11-27 HISTORY — DX: Xerosis cutis: L85.3

## 2013-11-27 HISTORY — DX: Hypoglycemia, unspecified: E16.2

## 2013-11-27 HISTORY — DX: Tachycardia, unspecified: R00.0

## 2013-11-27 LAB — BLOOD GAS, ARTERIAL
Acid-Base Excess: 0.2 mmol/L (ref 0.0–2.0)
Bicarbonate: 24 mEq/L (ref 20.0–24.0)
Drawn by: 206361
FIO2: 0.21 %
O2 Saturation: 98.3 %
Patient temperature: 98.6
TCO2: 25.1 mmol/L (ref 0–100)
pCO2 arterial: 36.5 mmHg (ref 35.0–45.0)
pH, Arterial: 7.433 (ref 7.350–7.450)
pO2, Arterial: 97.8 mmHg (ref 80.0–100.0)

## 2013-11-27 LAB — CBC
HCT: 38.3 % (ref 36.0–46.0)
Hemoglobin: 13.6 g/dL (ref 12.0–15.0)
MCH: 32.9 pg (ref 26.0–34.0)
MCHC: 35.5 g/dL (ref 30.0–36.0)
MCV: 92.5 fL (ref 78.0–100.0)
Platelets: 198 10*3/uL (ref 150–400)
RBC: 4.14 MIL/uL (ref 3.87–5.11)
RDW: 14.3 % (ref 11.5–15.5)
WBC: 7.2 10*3/uL (ref 4.0–10.5)

## 2013-11-27 LAB — COMPREHENSIVE METABOLIC PANEL
ALT: 36 U/L — ABNORMAL HIGH (ref 0–35)
AST: 29 U/L (ref 0–37)
Albumin: 4.1 g/dL (ref 3.5–5.2)
Alkaline Phosphatase: 69 U/L (ref 39–117)
BUN: 13 mg/dL (ref 6–23)
CO2: 22 mEq/L (ref 19–32)
Calcium: 9.7 mg/dL (ref 8.4–10.5)
Chloride: 102 mEq/L (ref 96–112)
Creatinine, Ser: 0.6 mg/dL (ref 0.50–1.10)
GFR calc Af Amer: >90 mL/min (ref >90)
GFR calc non Af Amer: >90 mL/min (ref >90)
Glucose, Bld: 82 mg/dL (ref 70–99)
Potassium: 3.9 mEq/L (ref 3.7–5.3)
Sodium: 141 mEq/L (ref 137–147)
Total Bilirubin: 0.3 mg/dL (ref 0.3–1.2)
Total Protein: 7.8 g/dL (ref 6.0–8.3)

## 2013-11-27 LAB — URINALYSIS, ROUTINE W REFLEX MICROSCOPIC
Bilirubin Urine: NEGATIVE
Glucose, UA: NEGATIVE mg/dL
Hgb urine dipstick: NEGATIVE
Ketones, ur: NEGATIVE mg/dL
Leukocytes, UA: NEGATIVE
Nitrite: NEGATIVE
Protein, ur: NEGATIVE mg/dL
Specific Gravity, Urine: 1.01 (ref 1.005–1.030)
Urobilinogen, UA: 0.2 mg/dL (ref 0.0–1.0)
pH: 6.5 (ref 5.0–8.0)

## 2013-11-27 LAB — PROTIME-INR
INR: 0.98 (ref 0.00–1.49)
Prothrombin Time: 12.8 seconds (ref 11.6–15.2)

## 2013-11-27 LAB — ABO/RH: ABO/RH(D): A POS

## 2013-11-27 LAB — TYPE AND SCREEN
ABO/RH(D): A POS
Antibody Screen: NEGATIVE

## 2013-11-27 LAB — SURGICAL PCR SCREEN
MRSA, PCR: NEGATIVE
Staphylococcus aureus: NEGATIVE

## 2013-11-27 LAB — APTT: aPTT: 29 seconds (ref 24–37)

## 2013-11-27 MED ORDER — DEXTROSE 5 % IV SOLN
1.5000 g | INTRAVENOUS | Status: AC
  Administered 2013-11-28: 1.5 g via INTRAVENOUS
  Filled 2013-11-27: qty 1.5

## 2013-11-27 NOTE — Progress Notes (Signed)
11/27/13 1340  OBSTRUCTIVE SLEEP APNEA  Have you ever been diagnosed with sleep apnea through a sleep study? No  Do you snore loudly (loud enough to be heard through closed doors)?  1  Do you often feel tired, fatigued, or sleepy during the daytime? 0  Has anyone observed you stop breathing during your sleep? 1  Do you have, or are you being treated for high blood pressure? 1  BMI more than 35 kg/m2? 1  Age over 57 years old? 1  Neck circumference greater than 40 cm/18 inches? 0  Gender: 0  Obstructive Sleep Apnea Score 5  Score 4 or greater  Results sent to PCP   This patient has screened at risk for sleep apnea using the STOP Bang tool during a pre-surgical visit. A score of 4 or greater is at risk for sleep apnea.

## 2013-11-27 NOTE — Progress Notes (Signed)
PCP is Dr. Toy Care in [place] was this year. Patient informed Nurse that she had a stress test > 5 years ago, but denied having a cardiac cath or sleep study. Sleep apnea Results sent to PCP.

## 2013-11-28 ENCOUNTER — Inpatient Hospital Stay (HOSPITAL_COMMUNITY): Payer: Medicare Other | Admitting: Anesthesiology

## 2013-11-28 ENCOUNTER — Encounter (HOSPITAL_COMMUNITY): Payer: Self-pay | Admitting: *Deleted

## 2013-11-28 ENCOUNTER — Encounter (HOSPITAL_COMMUNITY)
Admission: RE | Disposition: A | Discharge status: Home / Self Care | Payer: Self-pay | Source: Ambulatory Visit | Attending: Cardiothoracic Surgery

## 2013-11-28 ENCOUNTER — Inpatient Hospital Stay (HOSPITAL_COMMUNITY): Payer: Medicare Other

## 2013-11-28 ENCOUNTER — Inpatient Hospital Stay (HOSPITAL_COMMUNITY)
Admission: RE | Admit: 2013-11-28 | Discharge: 2013-12-03 | DRG: 165 | Disposition: A | Discharge status: Home / Self Care | Payer: Medicare Other | Source: Ambulatory Visit | Attending: Cardiothoracic Surgery | Admitting: Cardiothoracic Surgery

## 2013-11-28 ENCOUNTER — Encounter (HOSPITAL_COMMUNITY): Payer: Medicare Other | Admitting: Anesthesiology

## 2013-11-28 DIAGNOSIS — F329 Major depressive disorder, single episode, unspecified: Secondary | ICD-10-CM | POA: Diagnosis present

## 2013-11-28 DIAGNOSIS — I10 Essential (primary) hypertension: Secondary | ICD-10-CM | POA: Diagnosis present

## 2013-11-28 DIAGNOSIS — Z833 Family history of diabetes mellitus: Secondary | ICD-10-CM | POA: Diagnosis not present

## 2013-11-28 DIAGNOSIS — F3289 Other specified depressive episodes: Secondary | ICD-10-CM | POA: Diagnosis present

## 2013-11-28 DIAGNOSIS — C3490 Malignant neoplasm of unspecified part of unspecified bronchus or lung: Secondary | ICD-10-CM | POA: Diagnosis present

## 2013-11-28 DIAGNOSIS — Z4682 Encounter for fitting and adjustment of non-vascular catheter: Secondary | ICD-10-CM | POA: Diagnosis not present

## 2013-11-28 DIAGNOSIS — D3A090 Benign carcinoid tumor of the bronchus and lung: Principal | ICD-10-CM | POA: Diagnosis present

## 2013-11-28 DIAGNOSIS — Z8041 Family history of malignant neoplasm of ovary: Secondary | ICD-10-CM

## 2013-11-28 DIAGNOSIS — F410 Panic disorder [episodic paroxysmal anxiety] without agoraphobia: Secondary | ICD-10-CM | POA: Diagnosis not present

## 2013-11-28 DIAGNOSIS — Z803 Family history of malignant neoplasm of breast: Secondary | ICD-10-CM

## 2013-11-28 DIAGNOSIS — E039 Hypothyroidism, unspecified: Secondary | ICD-10-CM | POA: Diagnosis not present

## 2013-11-28 DIAGNOSIS — J9819 Other pulmonary collapse: Secondary | ICD-10-CM | POA: Diagnosis not present

## 2013-11-28 DIAGNOSIS — J9383 Other pneumothorax: Secondary | ICD-10-CM | POA: Diagnosis not present

## 2013-11-28 DIAGNOSIS — W19XXXA Unspecified fall, initial encounter: Secondary | ICD-10-CM | POA: Diagnosis present

## 2013-11-28 DIAGNOSIS — R222 Localized swelling, mass and lump, trunk: Secondary | ICD-10-CM | POA: Diagnosis present

## 2013-11-28 DIAGNOSIS — S298XXA Other specified injuries of thorax, initial encounter: Secondary | ICD-10-CM | POA: Diagnosis present

## 2013-11-28 DIAGNOSIS — M1990 Unspecified osteoarthritis, unspecified site: Secondary | ICD-10-CM | POA: Diagnosis present

## 2013-11-28 DIAGNOSIS — Y92009 Unspecified place in unspecified non-institutional (private) residence as the place of occurrence of the external cause: Secondary | ICD-10-CM

## 2013-11-28 DIAGNOSIS — R918 Other nonspecific abnormal finding of lung field: Secondary | ICD-10-CM | POA: Diagnosis present

## 2013-11-28 DIAGNOSIS — C3410 Malignant neoplasm of upper lobe, unspecified bronchus or lung: Secondary | ICD-10-CM

## 2013-11-28 DIAGNOSIS — K219 Gastro-esophageal reflux disease without esophagitis: Secondary | ICD-10-CM | POA: Diagnosis present

## 2013-11-28 DIAGNOSIS — D1430 Benign neoplasm of unspecified bronchus and lung: Secondary | ICD-10-CM | POA: Diagnosis not present

## 2013-11-28 HISTORY — PX: VIDEO ASSISTED THORACOSCOPY (VATS)/WEDGE RESECTION: SHX6174

## 2013-11-28 HISTORY — PX: VIDEO BRONCHOSCOPY: SHX5072

## 2013-11-28 SURGERY — BRONCHOSCOPY, VIDEO-ASSISTED
Anesthesia: General | Site: Chest

## 2013-11-28 MED ORDER — FENTANYL CITRATE 0.05 MG/ML IJ SOLN
INTRAMUSCULAR | Status: AC
  Filled 2013-11-28: qty 5

## 2013-11-28 MED ORDER — STERILE WATER FOR INJECTION IJ SOLN
INTRAMUSCULAR | Status: AC
  Filled 2013-11-28: qty 10

## 2013-11-28 MED ORDER — TRAMADOL HCL 50 MG PO TABS
50.0000 mg | ORAL_TABLET | Freq: Four times a day (QID) | ORAL | Status: DC | PRN
  Administered 2013-11-29 – 2013-12-02 (×4): 100 mg via ORAL
  Filled 2013-11-28 (×4): qty 2

## 2013-11-28 MED ORDER — ONDANSETRON HCL 4 MG/2ML IJ SOLN
4.0000 mg | Freq: Four times a day (QID) | INTRAMUSCULAR | Status: DC | PRN
  Administered 2013-11-29 – 2013-12-01 (×4): 4 mg via INTRAVENOUS
  Filled 2013-11-28 (×3): qty 2

## 2013-11-28 MED ORDER — OXYCODONE-ACETAMINOPHEN 5-325 MG PO TABS
1.0000 | ORAL_TABLET | ORAL | Status: DC | PRN
  Administered 2013-11-28: 1 via ORAL
  Administered 2013-11-29 – 2013-12-01 (×6): 2 via ORAL
  Filled 2013-11-28 (×4): qty 2
  Filled 2013-11-28: qty 1
  Filled 2013-11-28 (×3): qty 2

## 2013-11-28 MED ORDER — LACTATED RINGERS IV SOLN
INTRAVENOUS | Status: DC | PRN
  Administered 2013-11-28 (×2): via INTRAVENOUS

## 2013-11-28 MED ORDER — ROCURONIUM BROMIDE 50 MG/5ML IV SOLN
INTRAVENOUS | Status: AC
  Filled 2013-11-28: qty 1

## 2013-11-28 MED ORDER — SODIUM CHLORIDE 0.9 % IJ SOLN: 9.0000 mL | INTRAMUSCULAR | Status: DC | PRN

## 2013-11-28 MED ORDER — MIDAZOLAM HCL 2 MG/2ML IJ SOLN
INTRAMUSCULAR | Status: AC
  Filled 2013-11-28: qty 2

## 2013-11-28 MED ORDER — SENNOSIDES-DOCUSATE SODIUM 8.6-50 MG PO TABS
1.0000 | ORAL_TABLET | Freq: Every evening | ORAL | Status: DC | PRN
  Administered 2013-11-30: 1 via ORAL
  Filled 2013-11-28 (×2): qty 1

## 2013-11-28 MED ORDER — BUPIVACAINE 0.5 % ON-Q PUMP SINGLE CATH 400 ML
INJECTION | Status: DC | PRN
  Administered 2013-11-28: 400 mL

## 2013-11-28 MED ORDER — ATENOLOL 50 MG PO TABS
50.0000 mg | ORAL_TABLET | Freq: Every day | ORAL | Status: DC
Start: 2013-11-29 — End: 2013-12-03
  Administered 2013-11-29 – 2013-12-03 (×5): 50 mg via ORAL
  Filled 2013-11-28 (×5): qty 1

## 2013-11-28 MED ORDER — KCL IN DEXTROSE-NACL 20-5-0.45 MEQ/L-%-% IV SOLN
INTRAVENOUS | Status: DC
  Administered 2013-11-29 – 2013-11-30 (×2): via INTRAVENOUS
  Filled 2013-11-28 (×9): qty 1000

## 2013-11-28 MED ORDER — LIDOCAINE HCL (CARDIAC) 20 MG/ML IV SOLN
INTRAVENOUS | Status: AC
  Filled 2013-11-28: qty 5

## 2013-11-28 MED ORDER — CLONAZEPAM 1 MG PO TABS
1.0000 mg | ORAL_TABLET | Freq: Two times a day (BID) | ORAL | Status: DC | PRN
  Administered 2013-11-28 – 2013-12-02 (×4): 1 mg via ORAL
  Filled 2013-11-28 (×4): qty 1

## 2013-11-28 MED ORDER — BISACODYL 5 MG PO TBEC
10.0000 mg | DELAYED_RELEASE_TABLET | Freq: Every day | ORAL | Status: DC
  Administered 2013-11-29 – 2013-12-03 (×5): 10 mg via ORAL
  Filled 2013-11-28 (×5): qty 2

## 2013-11-28 MED ORDER — MORPHINE SULFATE 10 MG/ML IJ SOLN
INTRAMUSCULAR | Status: AC
  Filled 2013-11-28: qty 1

## 2013-11-28 MED ORDER — NALOXONE HCL 0.4 MG/ML IJ SOLN
0.4000 mg | INTRAMUSCULAR | Status: DC | PRN
  Filled 2013-11-28: qty 1

## 2013-11-28 MED ORDER — PANTOPRAZOLE SODIUM 40 MG PO TBEC
80.0000 mg | DELAYED_RELEASE_TABLET | Freq: Every day | ORAL | Status: DC
  Administered 2013-11-29 – 2013-12-03 (×5): 80 mg via ORAL
  Filled 2013-11-28 (×5): qty 2

## 2013-11-28 MED ORDER — VECURONIUM BROMIDE 10 MG IV SOLR
INTRAVENOUS | Status: AC
  Filled 2013-11-28: qty 10

## 2013-11-28 MED ORDER — ACETAMINOPHEN 500 MG PO TABS
1000.0000 mg | ORAL_TABLET | Freq: Four times a day (QID) | ORAL | Status: AC
  Administered 2013-11-28 – 2013-11-29 (×4): 1000 mg via ORAL
  Filled 2013-11-28 (×4): qty 2

## 2013-11-28 MED ORDER — OXYCODONE HCL 5 MG PO TABS: 5.0000 mg | ORAL_TABLET | Freq: Once | ORAL | Status: DC | PRN

## 2013-11-28 MED ORDER — PROPOFOL 10 MG/ML IV BOLUS
INTRAVENOUS | Status: AC
  Filled 2013-11-28: qty 20

## 2013-11-28 MED ORDER — FENTANYL 10 MCG/ML IV SOLN
INTRAVENOUS | Status: DC
  Administered 2013-11-28: 40 ug via INTRAVENOUS
  Administered 2013-11-28: 14:00:00 via INTRAVENOUS
  Administered 2013-11-29: 130 ug via INTRAVENOUS
  Administered 2013-11-29: 190 ug via INTRAVENOUS
  Filled 2013-11-28 (×2): qty 50

## 2013-11-28 MED ORDER — ONDANSETRON HCL 4 MG/2ML IJ SOLN
4.0000 mg | Freq: Four times a day (QID) | INTRAMUSCULAR | Status: DC | PRN
  Administered 2013-11-29: 4 mg via INTRAVENOUS
  Filled 2013-11-28 (×3): qty 2

## 2013-11-28 MED ORDER — POTASSIUM CHLORIDE 10 MEQ/50ML IV SOLN: 10.0000 meq | Freq: Every day | INTRAVENOUS | Status: DC | PRN

## 2013-11-28 MED ORDER — VECURONIUM BROMIDE 10 MG IV SOLR
INTRAVENOUS | Status: DC | PRN
  Administered 2013-11-28 (×2): 2 mg via INTRAVENOUS
  Administered 2013-11-28: 3 mg via INTRAVENOUS
  Administered 2013-11-28: 5 mg via INTRAVENOUS

## 2013-11-28 MED ORDER — HYDROMORPHONE HCL PF 1 MG/ML IJ SOLN
INTRAMUSCULAR | Status: AC
  Filled 2013-11-28: qty 1

## 2013-11-28 MED ORDER — OXYCODONE HCL 5 MG/5ML PO SOLN: 5.0000 mg | Freq: Once | ORAL | Status: DC | PRN

## 2013-11-28 MED ORDER — SODIUM CHLORIDE 0.9 % IJ SOLN
INTRAMUSCULAR | Status: AC
  Filled 2013-11-28: qty 10

## 2013-11-28 MED ORDER — ONDANSETRON HCL 4 MG/2ML IJ SOLN
INTRAMUSCULAR | Status: DC | PRN
  Administered 2013-11-28: 4 mg via INTRAVENOUS

## 2013-11-28 MED ORDER — PROPOFOL 10 MG/ML IV BOLUS
INTRAVENOUS | Status: DC | PRN
  Administered 2013-11-28: 200 mg via INTRAVENOUS
  Administered 2013-11-28: 30 mg via INTRAVENOUS
  Administered 2013-11-28: 20 mg via INTRAVENOUS
  Administered 2013-11-28: 50 mg via INTRAVENOUS
  Administered 2013-11-28: 30 mg via INTRAVENOUS
  Administered 2013-11-28: 50 mg via INTRAVENOUS

## 2013-11-28 MED ORDER — 0.9 % SODIUM CHLORIDE (POUR BTL) OPTIME
TOPICAL | Status: DC | PRN
  Administered 2013-11-28: 2000 mL

## 2013-11-28 MED ORDER — HYDROMORPHONE HCL PF 1 MG/ML IJ SOLN
0.2500 mg | INTRAMUSCULAR | Status: DC | PRN
  Administered 2013-11-28: 0.5 mg via INTRAVENOUS
  Administered 2013-11-28: 1 mg via INTRAVENOUS

## 2013-11-28 MED ORDER — OXYCODONE HCL 5 MG PO TABS
5.0000 mg | ORAL_TABLET | ORAL | Status: AC | PRN
  Administered 2013-11-29: 10 mg via ORAL
  Administered 2013-11-29: 5 mg via ORAL
  Filled 2013-11-28: qty 2
  Filled 2013-11-28 (×2): qty 1

## 2013-11-28 MED ORDER — ROCURONIUM BROMIDE 100 MG/10ML IV SOLN
INTRAVENOUS | Status: DC | PRN
  Administered 2013-11-28: 50 mg via INTRAVENOUS

## 2013-11-28 MED ORDER — LEVOTHYROXINE SODIUM 150 MCG PO TABS
150.0000 ug | ORAL_TABLET | Freq: Every day | ORAL | Status: DC
  Administered 2013-11-29 – 2013-12-03 (×5): 150 ug via ORAL
  Filled 2013-11-28 (×7): qty 1

## 2013-11-28 MED ORDER — BUPIVACAINE HCL (PF) 0.5 % IJ SOLN
INTRAMUSCULAR | Status: AC
  Filled 2013-11-28: qty 10

## 2013-11-28 MED ORDER — ALBUTEROL SULFATE (2.5 MG/3ML) 0.083% IN NEBU
2.5000 mg | INHALATION_SOLUTION | RESPIRATORY_TRACT | Status: DC
  Administered 2013-11-28 (×2): 2.5 mg via RESPIRATORY_TRACT
  Filled 2013-11-28 (×2): qty 3

## 2013-11-28 MED ORDER — PROMETHAZINE HCL 25 MG/ML IJ SOLN: 6.2500 mg | INTRAMUSCULAR | Status: DC | PRN

## 2013-11-28 MED ORDER — DIPHENHYDRAMINE HCL 12.5 MG/5ML PO ELIX
12.5000 mg | ORAL_SOLUTION | Freq: Four times a day (QID) | ORAL | Status: DC | PRN
  Filled 2013-11-28: qty 5

## 2013-11-28 MED ORDER — EPHEDRINE SULFATE 50 MG/ML IJ SOLN
INTRAMUSCULAR | Status: AC
  Filled 2013-11-28: qty 1

## 2013-11-28 MED ORDER — GLYCOPYRROLATE 0.2 MG/ML IJ SOLN
INTRAMUSCULAR | Status: AC
  Filled 2013-11-28: qty 2

## 2013-11-28 MED ORDER — HEMOSTATIC AGENTS (NO CHARGE) OPTIME
TOPICAL | Status: DC | PRN
  Administered 2013-11-28: 1 via TOPICAL

## 2013-11-28 MED ORDER — FENTANYL CITRATE 0.05 MG/ML IJ SOLN
INTRAMUSCULAR | Status: DC | PRN
  Administered 2013-11-28: 100 ug via INTRAVENOUS
  Administered 2013-11-28 (×5): 50 ug via INTRAVENOUS
  Administered 2013-11-28: 100 ug via INTRAVENOUS
  Administered 2013-11-28: 50 ug via INTRAVENOUS
  Administered 2013-11-28: 100 ug via INTRAVENOUS
  Administered 2013-11-28: 50 ug via INTRAVENOUS
  Administered 2013-11-28: 100 ug via INTRAVENOUS

## 2013-11-28 MED ORDER — MORPHINE SULFATE 10 MG/ML IJ SOLN
INTRAMUSCULAR | Status: DC | PRN
  Administered 2013-11-28 (×2): 5 mg via INTRAVENOUS

## 2013-11-28 MED ORDER — KCL IN DEXTROSE-NACL 20-5-0.45 MEQ/L-%-% IV SOLN
INTRAVENOUS | Status: AC
  Administered 2013-11-28: 1000 mL
  Filled 2013-11-28: qty 1000

## 2013-11-28 MED ORDER — EPHEDRINE SULFATE 50 MG/ML IJ SOLN
INTRAMUSCULAR | Status: DC | PRN
Start: 2013-11-28 — End: 2013-11-28
  Administered 2013-11-28 (×2): 5 mg via INTRAVENOUS

## 2013-11-28 MED ORDER — DIPHENHYDRAMINE HCL 50 MG/ML IJ SOLN
12.5000 mg | Freq: Four times a day (QID) | INTRAMUSCULAR | Status: DC | PRN
  Filled 2013-11-28: qty 0.25

## 2013-11-28 MED ORDER — MIDAZOLAM HCL 5 MG/5ML IJ SOLN
INTRAMUSCULAR | Status: DC | PRN
  Administered 2013-11-28 (×2): 2 mg via INTRAVENOUS

## 2013-11-28 MED ORDER — PHENYLEPHRINE HCL 10 MG/ML IJ SOLN
INTRAMUSCULAR | Status: DC | PRN
  Administered 2013-11-28: 80 ug via INTRAVENOUS
  Administered 2013-11-28: 40 ug via INTRAVENOUS
  Administered 2013-11-28: 80 ug via INTRAVENOUS

## 2013-11-28 MED ORDER — LIDOCAINE HCL (CARDIAC) 20 MG/ML IV SOLN
INTRAVENOUS | Status: DC | PRN
  Administered 2013-11-28: 80 mg via INTRAVENOUS

## 2013-11-28 MED ORDER — ACETAMINOPHEN 160 MG/5ML PO SOLN: 1000.0000 mg | Freq: Four times a day (QID) | ORAL | Status: AC

## 2013-11-28 MED ORDER — NEOSTIGMINE METHYLSULFATE 1 MG/ML IJ SOLN
INTRAMUSCULAR | Status: DC | PRN
  Administered 2013-11-28: 3 mg via INTRAVENOUS

## 2013-11-28 MED ORDER — BUPIVACAINE 0.5 % ON-Q PUMP SINGLE CATH 400 ML
400.0000 mL | INJECTION | Status: DC
  Filled 2013-11-28: qty 400

## 2013-11-28 MED ORDER — GLYCOPYRROLATE 0.2 MG/ML IJ SOLN
INTRAMUSCULAR | Status: DC | PRN
  Administered 2013-11-28: 0.4 mg via INTRAVENOUS

## 2013-11-28 MED ORDER — ONDANSETRON HCL 4 MG/2ML IJ SOLN
INTRAMUSCULAR | Status: AC
  Filled 2013-11-28: qty 2

## 2013-11-28 MED ORDER — SUCCINYLCHOLINE CHLORIDE 20 MG/ML IJ SOLN
INTRAMUSCULAR | Status: AC
  Filled 2013-11-28: qty 1

## 2013-11-28 MED ORDER — NEOSTIGMINE METHYLSULFATE 1 MG/ML IJ SOLN
INTRAMUSCULAR | Status: AC
  Filled 2013-11-28: qty 10

## 2013-11-28 MED ORDER — PHENYLEPHRINE 40 MCG/ML (10ML) SYRINGE FOR IV PUSH (FOR BLOOD PRESSURE SUPPORT)
PREFILLED_SYRINGE | INTRAVENOUS | Status: AC
  Filled 2013-11-28: qty 10

## 2013-11-28 MED ORDER — SODIUM CHLORIDE 0.9 % IJ SOLN
INTRAMUSCULAR | Status: AC
  Filled 2013-11-28: qty 20

## 2013-11-28 MED ORDER — ESCITALOPRAM OXALATE 10 MG PO TABS
10.0000 mg | ORAL_TABLET | Freq: Every day | ORAL | Status: DC
  Administered 2013-11-28 – 2013-12-02 (×5): 10 mg via ORAL
  Filled 2013-11-28 (×7): qty 1

## 2013-11-28 SURGICAL SUPPLY — 95 items
APPLICATOR TIP COSEAL (VASCULAR PRODUCTS) IMPLANT
APPLICATOR TIP EXT COSEAL (VASCULAR PRODUCTS) ×3 IMPLANT
BLADE SURG 11 STRL SS (BLADE) ×3 IMPLANT
BRUSH CYTOL CELLEBRITY 1.5X140 (MISCELLANEOUS) IMPLANT
CANISTER SUCTION 2500CC (MISCELLANEOUS) ×3 IMPLANT
CATH KIT ON Q 5IN SLV (PAIN MANAGEMENT) IMPLANT
CATH KIT ON Q 7.5IN SLV (PAIN MANAGEMENT) ×3 IMPLANT
CATH THORACIC 28FR (CATHETERS) ×3 IMPLANT
CATH THORACIC 36FR (CATHETERS) IMPLANT
CATH THORACIC 36FR RT ANG (CATHETERS) IMPLANT
CLIP TI MEDIUM 6 (CLIP) ×3 IMPLANT
CLOSURE STERI-STRIP 1/4X4 (GAUZE/BANDAGES/DRESSINGS) ×3 IMPLANT
CONN ST 1/4X3/8  BEN (MISCELLANEOUS) ×1
CONN ST 1/4X3/8 BEN (MISCELLANEOUS) ×2 IMPLANT
CONN Y 3/8X3/8X3/8  BEN (MISCELLANEOUS) ×1
CONN Y 3/8X3/8X3/8 BEN (MISCELLANEOUS) ×2 IMPLANT
CONT SPEC 4OZ CLIKSEAL STRL BL (MISCELLANEOUS) ×6 IMPLANT
COVER TABLE BACK 60X90 (DRAPES) ×3 IMPLANT
DRAIN CHANNEL 28F RND 3/8 FF (WOUND CARE) ×3 IMPLANT
DRAIN CHANNEL 32F RND 10.7 FF (WOUND CARE) IMPLANT
DRAPE LAPAROSCOPIC ABDOMINAL (DRAPES) ×3 IMPLANT
DRAPE WARM FLUID 44X44 (DRAPE) ×3 IMPLANT
DRILL BIT 7/64X5 (BIT) IMPLANT
DRSG AQUACEL AG ADV 3.5X10 (GAUZE/BANDAGES/DRESSINGS) ×3 IMPLANT
DRSG TEGADERM 4X4.75 (GAUZE/BANDAGES/DRESSINGS) ×3 IMPLANT
ELECT BLADE 4.0 EZ CLEAN MEGAD (MISCELLANEOUS) ×6
ELECT REM PT RETURN 9FT ADLT (ELECTROSURGICAL) ×3
ELECTRODE BLDE 4.0 EZ CLN MEGD (MISCELLANEOUS) ×4 IMPLANT
ELECTRODE REM PT RTRN 9FT ADLT (ELECTROSURGICAL) ×2 IMPLANT
FORCEPS BIOP RJ4 1.8 (CUTTING FORCEPS) ×3 IMPLANT
GEL ULTRASOUND 20GR AQUASONIC (MISCELLANEOUS) ×3 IMPLANT
GLOVE BIO SURGEON STRL SZ 6 (GLOVE) ×3 IMPLANT
GLOVE BIO SURGEON STRL SZ 6.5 (GLOVE) ×9 IMPLANT
GLOVE BIOGEL PI IND STRL 6 (GLOVE) ×8 IMPLANT
GLOVE BIOGEL PI IND STRL 7.0 (GLOVE) ×2 IMPLANT
GLOVE BIOGEL PI INDICATOR 6 (GLOVE) ×4
GLOVE BIOGEL PI INDICATOR 7.0 (GLOVE) ×1
GOWN STRL NON-REIN LRG LVL3 (GOWN DISPOSABLE) ×18 IMPLANT
HANDLE STAPLE ENDO GIA SHORT (STAPLE) ×2
KIT BASIN OR (CUSTOM PROCEDURE TRAY) ×3 IMPLANT
KIT ROOM TURNOVER OR (KITS) ×3 IMPLANT
KIT SUCTION CATH 14FR (SUCTIONS) ×3 IMPLANT
MARKER SKIN DUAL TIP RULER LAB (MISCELLANEOUS) ×3 IMPLANT
NEEDLE BIOPSY TRANSBRONCH 21G (NEEDLE) IMPLANT
NS IRRIG 1000ML POUR BTL (IV SOLUTION) ×9 IMPLANT
OIL SILICONE PENTAX (PARTS (SERVICE/REPAIRS)) ×3 IMPLANT
PACK CHEST (CUSTOM PROCEDURE TRAY) ×3 IMPLANT
PAD ARMBOARD 7.5X6 YLW CONV (MISCELLANEOUS) ×9 IMPLANT
PASSER SUT SWANSON 36MM LOOP (INSTRUMENTS) ×3 IMPLANT
RELOAD EGIA 45 TAN VASC (STAPLE) ×3 IMPLANT
RELOAD EGIA BLACK ROTIC 45MM (STAPLE) ×3 IMPLANT
RELOAD EGIA TRIS TAN 45 CVD (STAPLE) ×12 IMPLANT
SCISSORS LAP 5X35 DISP (ENDOMECHANICALS) IMPLANT
SEALANT PROGEL (MISCELLANEOUS) IMPLANT
SEALANT SURG COSEAL 4ML (VASCULAR PRODUCTS) IMPLANT
SEALANT SURG COSEAL 8ML (VASCULAR PRODUCTS) ×3 IMPLANT
SOLUTION ANTI FOG 6CC (MISCELLANEOUS) ×3 IMPLANT
SPONGE GAUZE 4X4 12PLY (GAUZE/BANDAGES/DRESSINGS) ×3 IMPLANT
SPONGE GAUZE 4X4 12PLY STER LF (GAUZE/BANDAGES/DRESSINGS) ×3 IMPLANT
SPONGE INTESTINAL PEANUT (DISPOSABLE) ×3 IMPLANT
STAPLER ENDO GIA 12MM SHORT (STAPLE) ×4 IMPLANT
SUT PROLENE 3 0 SH DA (SUTURE) IMPLANT
SUT PROLENE 4 0 RB 1 (SUTURE)
SUT PROLENE 4-0 RB1 .5 CRCL 36 (SUTURE) IMPLANT
SUT SILK  1 MH (SUTURE) ×2
SUT SILK 1 MH (SUTURE) ×4 IMPLANT
SUT SILK 2 0 SH (SUTURE) IMPLANT
SUT SILK 2 0SH CR/8 30 (SUTURE) IMPLANT
SUT SILK 3 0 SH CR/8 (SUTURE) ×3 IMPLANT
SUT SILK 3 0SH CR/8 30 (SUTURE) IMPLANT
SUT STEEL 1 (SUTURE) IMPLANT
SUT VIC AB 1 CTX 18 (SUTURE) IMPLANT
SUT VIC AB 1 CTX 36 (SUTURE)
SUT VIC AB 1 CTX36XBRD ANBCTR (SUTURE) IMPLANT
SUT VIC AB 2-0 CTX 36 (SUTURE) IMPLANT
SUT VIC AB 2-0 UR6 27 (SUTURE) IMPLANT
SUT VIC AB 3-0 SH 8-18 (SUTURE) IMPLANT
SUT VIC AB 3-0 X1 27 (SUTURE) IMPLANT
SUT VICRYL 2 TP 1 (SUTURE) IMPLANT
SWAB COLLECTION DEVICE MRSA (MISCELLANEOUS) IMPLANT
SYR 20ML ECCENTRIC (SYRINGE) ×3 IMPLANT
SYSTEM SAHARA CHEST DRAIN ATS (WOUND CARE) ×3 IMPLANT
TAPE CLOTH SURG 4X10 WHT LF (GAUZE/BANDAGES/DRESSINGS) ×3 IMPLANT
TAPE UMBILICAL COTTON 1/8X30 (MISCELLANEOUS) ×3 IMPLANT
TIP APPLICATOR SPRAY EXTEND 16 (VASCULAR PRODUCTS) IMPLANT
TOWEL OR 17X24 6PK STRL BLUE (TOWEL DISPOSABLE) ×6 IMPLANT
TOWEL OR 17X26 10 PK STRL BLUE (TOWEL DISPOSABLE) ×6 IMPLANT
TRAP SPECIMEN MUCOUS 40CC (MISCELLANEOUS) ×3 IMPLANT
TRAY FOLEY CATH 14FRSI W/METER (CATHETERS) ×3 IMPLANT
TROCAR XCEL 12X100 BLDLESS (ENDOMECHANICALS) ×3 IMPLANT
TROCAR XCEL NON-BLD 11X100MML (ENDOMECHANICALS) ×3 IMPLANT
TUBE ANAEROBIC SPECIMEN COL (MISCELLANEOUS) IMPLANT
TUBE CONNECTING 12X1/4 (SUCTIONS) ×6 IMPLANT
TUNNELER SHEATH ON-Q 11GX8 DSP (PAIN MANAGEMENT) ×3 IMPLANT
WATER STERILE IRR 1000ML POUR (IV SOLUTION) ×6 IMPLANT

## 2013-11-28 NOTE — Anesthesia Procedure Notes (Addendum)
Procedure Name: Intubation Date/Time: 11/28/2013 8:52 AM Performed by: Manuela Schwartz B Grade View: Grade I Endobronchial tube: Left, Double lumen EBT, EBT position confirmed by auscultation and EBT position confirmed by fiberoptic bronchoscope and 37 Fr Airway Equipment and Method: Video-laryngoscopy and Rigid stylet (ETT exchanged for DLT with glidescope over Cool Catheter without difficulty) Placement Confirmation: ETT inserted through vocal cords under direct vision,  positive ETCO2 and breath sounds checked- equal and bilateral Tube secured with: Tape Dental Injury: Teeth and Oropharynx as per pre-operative assessment     Procedure Name: Intubation Date/Time: 11/28/2013 8:31 AM Performed by: Manuela Schwartz B Pre-anesthesia Checklist: Patient identified, Emergency Drugs available, Suction available, Patient being monitored and Timeout performed Patient Re-evaluated:Patient Re-evaluated prior to inductionOxygen Delivery Method: Circle system utilized Preoxygenation: Pre-oxygenation with 100% oxygen Intubation Type: IV induction Ventilation: Mask ventilation without difficulty Laryngoscope Size: Mac and 3 Grade View: Grade III Tube type: Oral Tube size: 8.5 mm Number of attempts: 1 Airway Equipment and Method: Stylet Placement Confirmation: ETT inserted through vocal cords under direct vision,  positive ETCO2 and breath sounds checked- equal and bilateral Secured at: 21 cm Tube secured with: Tape Dental Injury: Teeth and Oropharynx as per pre-operative assessment     The patient was identified and consent obtained.  TO was performed, and full barrier precautions were used.  The skin was anesthetized with lidocaine.  Once the vein was located with the 22 ga., the wire was inserted into the vein. The insertion site was dilated and the CVL was carefully inserted and sutured in place. The patient tolerated the procedure well.  CXR was ordered for PACU.Ultrasound guidance: relevant anatomy  identified, needle position confirmed, vessel patent, wire seen inside the vessel.  Images printed for the medical record.  Start: 0805 End: 0815 J. Tedra Senegal, MD

## 2013-11-28 NOTE — Anesthesia Preprocedure Evaluation (Addendum)
Anesthesia Evaluation  Patient identified by MRN, date of birth, ID band Patient awake    Reviewed: Allergy & Precautions, H&P , Patient's Chart, lab work & pertinent test results, reviewed documented beta blocker date and time   History of Anesthesia Complications (+) PONV and history of anesthetic complications  Airway Mallampati: I TM Distance: >3 FB     Dental  (+) Teeth Intact and Dental Advisory Given   Pulmonary    Pulmonary exam normal       Cardiovascular hypertension, Pt. on medications and Pt. on home beta blockers     Neuro/Psych PSYCHIATRIC DISORDERS Anxiety Depression negative neurological ROS     GI/Hepatic Neg liver ROS, GERD-  Medicated,  Endo/Other  Hypothyroidism   Renal/GU negative Renal ROS     Musculoskeletal   Abdominal   Peds  Hematology   Anesthesia Other Findings   Reproductive/Obstetrics                         Anesthesia Physical Anesthesia Plan  ASA: III  Anesthesia Plan: General   Post-op Pain Management:    Induction: Intravenous  Airway Management Planned: Double Lumen EBT  Additional Equipment: Arterial line and CVP  Intra-op Plan:   Post-operative Plan: Possible Post-op intubation/ventilation  Informed Consent: I have reviewed the patients History and Physical, chart, labs and discussed the procedure including the risks, benefits and alternatives for the proposed anesthesia with the patient or authorized representative who has indicated his/her understanding and acceptance.   Dental advisory given  Plan Discussed with: Surgeon, Anesthesiologist and CRNA  Anesthesia Plan Comments:        Anesthesia Quick Evaluation

## 2013-11-28 NOTE — Brief Op Note (Addendum)
      [address]       [place] [address]             [phone]      11/28/2013  1:03 PM  PATIENT:  Marie Chavez  58 y.o. female  PRE-OPERATIVE DIAGNOSIS:  LEFT LUNG MASS  POST-OPERATIVE DIAGNOSIS:  Neuroendocrine tumor.   , LEFT UPPER LOBE  PROCEDURE:   VIDEO BRONCHOSCOPY LEFT VIDEO ASSISTED THORACOSCOPY/MINI-THORACOTOMY, LEFT UPPER LOBECTOMY, LYMPH NODE DISSECTION  SURGEON:  Surgeon(s): Grace Isaac, MD  ASSISTANT: Suzzanne Cloud, PA-C  ANESTHESIA:   general  SPECIMEN:  Source of Specimen:  Left upper lobe  DISPOSITION OF SPECIMEN:  Pathology  DRAINS: 28 Fr CT, 28 Blake drain  PATIENT CONDITION:  PACU - hemodynamically stable.

## 2013-11-28 NOTE — Progress Notes (Signed)
S/p left upper lobectomy  Extubated Lethargic but does respond to voice  BP 133/78  Pulse 85  Temp(Src) 97.4 F (36.3 C) (Oral)  Resp 13  SpO2 97%   Intake/Output Summary (Last 24 hours) at 11/28/13 1807 Last data filed at 11/28/13 1700  Gross per 24 hour  Intake   1970 ml  Output    792 ml  Net   1178 ml    EtCO2 high- observe closely  Pulmonary hygiene

## 2013-11-28 NOTE — Transfer of Care (Signed)
Immediate Anesthesia Transfer of Care Note  Patient: Marie Chavez  Procedure(s) Performed: Procedure(s): VIDEO BRONCHOSCOPY (N/A) VIDEO ASSISTED THORACOSCOPY (VATS)  Lobectomy (Left)  Patient Location: PACU  Anesthesia Type:General  Level of Consciousness: responds to stimulation  Airway & Oxygen Therapy: Patient Spontanous Breathing and Patient connected to face mask oxygen  Post-op Assessment: Report given to PACU RN and Post -op Vital signs reviewed and stable  Post vital signs: Reviewed and stable  Complications: No apparent anesthesia complications

## 2013-11-28 NOTE — H&P (Signed)
[address]       [place] [address]             [phone]                         Lajoyce M [facility] Medical Record #[id number] Date of Birth: May 07, 1956  Referring: Chesley Mires, MD Primary Care: Christ Kick, MD  Chief Complaint:   Left lung mass   History of Present Illness:    Marie Chavez 58 y.o. female is seen in the office  today for a slowly enlarging left upper lobe central lung mass at the request of Dr. Llana Aliment. The patient notes a recent upper respiratory infection and at the time of evaluation a chest x-ray was done in the left upper lobe lung mass was noted patient was referred for further evaluation. With the upper respiratory infection 3-4 weeks ago she had some blood tinged sputum, she described as more streaks this is since cleared. She's had no weight loss, no fever chills. She's a nonsmoker.  The lesion in question has been present since 2008. At that time she fell in the tub at home suffering chest injury and a 1.5 cm left upper lobe lung mass was noted. She was referred to Dr. Lyda Jester ,  no biopsy was obtained and the patient was lost to follow up.  The repeat CT scans were performed in 2013 and 2012. A PET scan was performed in 2008.   Patient returns to the office today to further discuss treatment options and to review her pulmonary function studies which were obtained last week.  Current Activity/ Functional Status:  Patient is independent with mobility/ambulation, transfers, ADL's, IADL's. patient is disabled because of chronic back pain  Zubrod Score: At the time of surgery this patient's most appropriate activity status/level should be described as: []  Normal activity, no symptoms [x]  Symptoms, fully ambulatory []  Symptoms, in bed less than or equal to 50% of the time []  Symptoms, in bed greater than 50% of the time but less than 100% []  Bedridden []  Moribund   Past Medical History  Diagnosis Date  .  Hypertension   . Panic disorder   . Herniated disc   . DJD (degenerative joint disease)   . PONV (postoperative nausea and vomiting)     Had a panic attack upon awakening from Hysterectomy   . Tachycardia     Takes Atenolol  . Bronchitis     Hx of  . Environmental allergies   . Seasonal allergies   . Pneumonia     Hx of "walking Pneumonia"   . Shortness of breath     when bending over or just sitting thinks it comes from lung mass  . Depression   . GERD (gastroesophageal reflux disease)   . Hypothyroidism   . Hypoglycemia   . Urgency of urination   . Dry skin     Past Surgical History  Procedure Laterality Date  . Cholecystectomy    . Vaginal hysterectomy    . Hernia repair      X 3  . Appendectomy    . Vaginal sling  2012    Family History  Problem Relation Age of Onset  . Breast cancer Sister    father died at age 22 with diabetes and renal failure, mother is alive at age 36 and "healthy". Patient has 6 sisters, one with history of breast cancer  2 with thyroid disease one with ovarian cancer one with brain aneurysm. One son died in house fire  History   Social History  . Marital Status: Married    Spouse Name: N/A    Number of Children: N/A  . Years of Education: N/A   Occupational History  . disabled    Social History Main Topics  . Smoking status: Never Smoker   . Smokeless tobacco: Not on file  . Alcohol Use: Not on file  . Drug Use: Not on file  . Sexual Activity: Not on file      History  Smoking status  . Never Smoker   Smokeless tobacco  . Not on file    History  Alcohol Use:  none      Allergies  Allergen Reactions  . Bee Venom Anaphylaxis and Other (See Comments)    Developed "blue rings" all over body after being stung  . Celebrex [Celecoxib]     REACTION: heart rate increases, itching  . Mobic [Meloxicam] Itching    HEART RACES  . Prednisone     REACTION: Heart rate increases, itching    Current Facility-Administered  Medications  Medication Dose Route Frequency Provider Last Rate Last Dose  . cefUROXime (ZINACEF) 1.5 g in dextrose 5 % 50 mL IVPB  1.5 g Intravenous 60 min Pre-Op Grace Isaac, MD           Review of Systems:     Cardiac Review of Systems: Y or N  Chest Pain [ n   ]  Resting SOB [ n  ] Exertional SOB  [ y ]  Vertell Limber Florencio.Farrier  ]   Pedal Edema [ n  ]    Palpitations [n  ] Syncope  Florencio.Farrier  ]   Presyncope [  n ]  General Review of Systems: [Y] = yes [  ]=no Constitional: recent weight change [n  ];  Wt loss over the last 3 months [  0 ] anorexia [  ]; fatigue [  ]; nausea [  ]; night sweats [ n ]; fever [ n ]; or chills [n  ];          Dental: poor dentition[ n ]; Last Dentist visit:   Eye : blurred vision [  ]; diplopia [   ]; vision changes [  ];  Amaurosis fugax[  ]; Resp: cough Blue.Reese  ];  wheezing[y  ];  hemoptysis[ y ]; shortness of breath[  ]; paroxysmal nocturnal dyspnea[  ]; dyspnea on exertion[ y ]; or orthopnea[  ];  GI:  gallstones[  ], vomiting[  ];  dysphagia[  ]; melena[  ];  hematochezia [  ]; heartburn[  ];   Hx of  Colonoscopy[y  ]; GU: kidney stones [  ]; hematuria[  ];   dysuria [  ];  nocturia[  ];  history of     obstruction [  ]; urinary frequency [  ]             Skin: rash, swelling[  ];, hair loss[  ];  peripheral edema[  ];  or itching[  ]; Musculosketetal: myalgias[  ];  joint swelling[ y ];  joint erythema[ y ];  joint pain[  ];  back pain[  ];  Heme/Lymph: bruising[  ];  bleeding[  ];  anemia[  ];  Neuro: TIA[  ];  headaches[  ];  stroke[  ];  vertigo[  ];  seizures[ n ];   paresthesias[n];  difficulty walking[n  ];  Psych:depression[  ]; anxiety[yes  ];  Endocrine: diabetes[ n ];  thyroid dysfunction[y  ];  Immunizations: Flu up to date [ y ]; Pneumococcal up to date Blue.Reese  ];  Other:  Physical Exam: BP 156/79  Pulse 67  Temp(Src) 97.7 F (36.5 C) (Oral)  Resp 16  SpO2 98%    General appearance: alert, cooperative, appears stated age and no  distress Neurologic: intact Heart: regular rate and rhythm, S1, S2 normal, no murmur, click, rub or gallop Lungs: clear to auscultation bilaterally Abdomen: soft, non-tender; bowel sounds normal; no masses,  no organomegaly Extremities: extremities normal, atraumatic, no cyanosis or edema and Homans sign is negative, no sign of DVT Patient has no cervical or supraclavicular adenopathy, she has no carotid bruits, she has full DP and PT pulses bilaterally   Diagnostic Studies & Laboratory data:     Recent Radiology Findings:  On serial measurements by me CT scan 2008 August showed left upper lobe mass 16 mm in size, 2012 the mass and increased to 26 mm, 2013 was measured at 27.78mm, most recent scan in 2015 27 millimeters The PET scan in 2008 and 2015 show very low metabolic activity. She also has a new lesion peripherally left upper lobe. CT 2015: CT CHEST WITHOUT CONTRAST  TECHNIQUE: Multidetector CT imaging of the chest was performed following the standard protocol without IV contrast.  COMPARISON: DG CHEST 2V dated 10/13/2013; CT CHEST W/O CM dated 07/11/2012; CT CHEST W/ CM dated 09/27/2011; CT CHEST W/ CM dated 06/19/2007; NM PET IMAGE SKULL BASE TO THIGH dated 08/02/2007  FINDINGS: Left smoothly marginated suprahilar pulmonary nodule, 2.1 x 2.6 cm on image 20 of series 4, formerly 2.0 x 2.6 cm on the 2012 examination.  Tubular structure extending distally from this nodule probably represents with a plugged bronchiectatic airway and appears to branch with nodularity in the peripheral lung. The degree of bronchiectasis and the peripheral lobular branching head increased since the prior examination from 07/11/2012, and the posterior peripheral branching terminates in a 1.2 cm nodule (image 14 of series 4).  No pathologic thoracic adenopathy. 3 mm right lower lobe nodule, image 34 of series 4, no change from 09/27/2011, considered benign.  Old healed left rib  fractures.  IMPRESSION: 1. Slow growing sharply marginated central left upper lobe pulmonary nodule has increased from 1.6 cm in diameter in 2008, to 2.6 cm in diameter today. 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 There is worsening bronchiectasis of distal airways with new nodular enlargement of the terminal portions of these bronchiectatic airways. These should typically be resected - assuming histologic consultation by bronchoscopic biopsy, thoracic surgical consultation recommended.   Electronically Signed By: Sherryl Barters M.D. On: 10/29/2013 13:22     Nm Pet Image Initial (pi) Skull Base To Thigh  11/08/2013   CLINICAL DATA:  Initial treatment strategy for lung mass.  EXAM: NUCLEAR MEDICINE PET SKULL BASE TO THIGH  FASTING BLOOD GLUCOSE:  Value: 89mg /dl  TECHNIQUE: 17.3 mCi F-18 FDG was injected intravenously. CT data was obtained and used for attenuation correction and anatomic localization only. (This was not acquired as a diagnostic CT examination.) Additional exam technical data entered on technologist worksheet.  COMPARISON:  NM PET IMAGE SKULL BASE TO THIGH dated 08/02/2007; CT CHEST W/O CM dated 10/29/2013; CT CHEST W/O CM dated 07/11/2012; NM  PET TUM IMG SKULL BASE T - THIGH dated 10/04/2011  FINDINGS: NECK  No hypermetabolic lymph nodes in the neck.  CHEST  Rounded smoothly marginated left upper lobe nodule demonstrates very low metabolic activity SUV max 2.5. This nodule measures 22 x 20 mm (image 78, series 2) increased from 15 x 15 mm on PET-CT of 08/02/2007. Compared to the most recent CT scans the lesion actually measures smaller which may be due to differing technique. This recent CT scan measured lesion at 26 x 21 mm (11/08/2013). Small peripheral nodule in the left upper lobe measures 6 mm compared to 11  mm on most recent CT scan. This does not have measurable metabolic activity.  There are no hypermetabolic mediastinal lymph nodes.  ABDOMEN/PELVIS  No abnormal hypermetabolic activity within the liver, pancreas, adrenal glands, or spleen. No hypermetabolic lymph nodes in the abdomen or pelvis.  SKELETON  No focal hypermetabolic activity to suggest skeletal metastasis.  IMPRESSION: 1. Very low metabolic activity associated with the enlarging left upper lobe rounded nodule. 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 2. Small peripheral nodule in the left upper lobe has no metabolic activity and is new from the remote PET scan. 3. No mediastinal hypermetabolic lymph nodes or distant metastasis.   Electronically Signed   By: Suzy Bouchard M.D.   On: 11/08/2013 11:47    CT 2008: EXAM: Chest CT Date of exam: 06/19/2007 12:06:00 PM Reason for exam: CHEST LUMP, chest x-ray abnormality (not at this institution) Comparison exam: None Contrast enhancement: 100cc Optiray 350 IV.  16 mm soft tissue nodule seen adjacent to the left upper lobe bronchus. No other significant  pulmonary nodules/masses or adenopathy is identified. No acute pulmonary infiltrate. No pleural effusion. No mediastinal adenopathy. Normal cardiac size.  IMPRESSION: 16 mm soft tissue nodule seen in the left perihilar immediately adjacent to the left upper lobe  bronchus. This is of uncertain etiology and may represent prominent lymph node, however other  etiologies cannot be completely ruled out. Two-month follow-up CT recommended if no additional evaluation is clinically warranted. REPORTED/SIGNED BY: Irving Copas MD  SIGN DATE/TIME: 06/19/07 1529  CT 2012 CT CHEST WITH CONTRAST  Technique: Multidetector CT imaging of the chest was performed following the standard protocol during bolus administration of intravenous contrast.  Contrast: 50 ml Isovue 370.  Comparison: 06/19/2007.  Findings: No pathologically  enlarged mediastinal, hilar or axillary lymph nodes. Heart size normal. No pericardial effusion.  Left upper lobe nodule has enlarged, now measuring 2.6 x 2.0 cm (previously 1.7 x 1.3 cm). No new nodules. Lungs are otherwise clear. No pleural fluid. Airway is unremarkable.  Incidental imaging of the upper abdomen shows somewhat decreased attenuation throughout the visualized portion of the liver, with some areas of peripheral sparing. No worrisome lytic or sclerotic lesions.  IMPRESSION:  1. Slowly enlarging left upper lobe nodule, highly worrisome for indolent primary bronchogenic carcinoma. If this is a non-small cell lung cancer, imaging findings favor T1bN0M0 or stage IA disease. These results will be called to the ordering clinician or representative by the Radiologist Assistant, and communication documented in the PACS Dashboard. 2. Question hepatic steatosis.  CT 2013: CT CHEST WITHOUT CONTRAST  Technique: Multidetector CT imaging of the chest was performed following the standard protocol without IV contrast.  Comparison: 09/27/2011  Findings: Central left upper lobe nodule today measures 2.7 x 2.1 cm. Previously this measured 2.6 x 2.0 cm. It has slightly enlarged. Lungs are otherwise clear.  No obvious abnormal  adenopathy. No pericardial effusion.  Mild atherosclerotic changes of the aorta.  Post cholecystectomy.  No pneumothorax and no pleural effusion.  Chronic left-sided rib deformities.  IMPRESSION: Left upper lobe nodule has slightly enlarged compared to a CT from December 2012. Slow-growing neoplasm is not excluded. Consider tissue diagnosis.   Original Report Authenticated By: Jamas Lav, M.D.     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 I discussed the left upper lobe lung mass that has been slowly increasing in size since 2008. It is slow-growing and not hypermetabolic on PET scan. Likely diagnosis of carcinoid but to date no tissue diagnosis has been obtained. The patient has adequate pulmonary function studies for resection. I recommended to her today that we proceed with bronchoscopy left video assisted thoracoscopy with lung resection probable lobectomy for a slowly enlarging left lung mass. Risks and options of surgery were discussed in detail. She is willing to proceed.  The goals risks and alternatives of the planned surgical procedure  Bronchoscopy & Left VATS Lung Resection  have been discussed with the patient in detail. The risks of the procedure including death, infection, stroke, myocardial infarction, bleeding, blood transfusion have all been discussed specifically.  I have quoted Marie Chavez a 3 % of perioperative mortality and a complication rate as high as 20 %. The patient's questions have been answered.Marie Chavez is willing  to proceed with the planned procedure.  Grace Isaac MD      [address] [place] [address] Office [phone]   Beeper [phone]  11/28/2013 8:16 AM

## 2013-11-29 ENCOUNTER — Inpatient Hospital Stay (HOSPITAL_COMMUNITY): Payer: Medicare Other

## 2013-11-29 DIAGNOSIS — J9819 Other pulmonary collapse: Secondary | ICD-10-CM | POA: Diagnosis not present

## 2013-11-29 LAB — CBC
HEMATOCRIT: 30.5 % — AB (ref 36.0–46.0)
Hemoglobin: 10 g/dL — ABNORMAL LOW (ref 12.0–15.0)
MCH: 31.9 pg (ref 26.0–34.0)
MCHC: 32.8 g/dL (ref 30.0–36.0)
MCV: 97.4 fL (ref 78.0–100.0)
Platelets: 155 10*3/uL (ref 150–400)
RBC: 3.13 MIL/uL — AB (ref 3.87–5.11)
RDW: 15.1 % (ref 11.5–15.5)
WBC: 9 10*3/uL (ref 4.0–10.5)

## 2013-11-29 LAB — POCT I-STAT 3, ART BLOOD GAS (G3+)
Acid-Base Excess: 3 mmol/L — ABNORMAL HIGH (ref 0.0–2.0)
Bicarbonate: 28.1 mEq/L — ABNORMAL HIGH (ref 20.0–24.0)
Bicarbonate: 29.9 mEq/L — ABNORMAL HIGH (ref 20.0–24.0)
O2 Saturation: 96 %
O2 Saturation: 99 %
Patient temperature: 98.6
Patient temperature: 98.5
TCO2: 30 mmol/L (ref 0–100)
TCO2: 32 mmol/L (ref 0–100)
pCO2 arterial: 63.4 mmHg (ref 35.0–45.0)
pCO2 arterial: 55 mmHg — ABNORMAL HIGH (ref 35.0–45.0)
pH, Arterial: 7.255 — ABNORMAL LOW (ref 7.350–7.450)
pH, Arterial: 7.344 — ABNORMAL LOW (ref 7.350–7.450)
pO2, Arterial: 97 mmHg (ref 80.0–100.0)
pO2, Arterial: 126 mmHg — ABNORMAL HIGH (ref 80.0–100.0)

## 2013-11-29 LAB — URINE MICROSCOPIC-ADD ON

## 2013-11-29 LAB — BASIC METABOLIC PANEL
BUN: 9 mg/dL (ref 6–23)
CO2: 23 meq/L (ref 19–32)
Calcium: 7.2 mg/dL — ABNORMAL LOW (ref 8.4–10.5)
Chloride: 105 mEq/L (ref 96–112)
Creatinine, Ser: 0.47 mg/dL — ABNORMAL LOW (ref 0.50–1.10)
GFR calc Af Amer: >90 mL/min (ref >90)
GFR calc non Af Amer: >90 mL/min (ref >90)
GLUCOSE: 122 mg/dL — AB (ref 70–99)
POTASSIUM: 3.8 meq/L (ref 3.7–5.3)
SODIUM: 139 meq/L (ref 137–147)

## 2013-11-29 LAB — URINALYSIS, ROUTINE W REFLEX MICROSCOPIC
Bilirubin Urine: NEGATIVE
Glucose, UA: NEGATIVE mg/dL
Ketones, ur: NEGATIVE mg/dL
Nitrite: NEGATIVE
Protein, ur: NEGATIVE mg/dL
Specific Gravity, Urine: 1.008 (ref 1.005–1.030)
Urobilinogen, UA: 0.2 mg/dL (ref 0.0–1.0)
pH: 6 (ref 5.0–8.0)

## 2013-11-29 LAB — GLUCOSE, CAPILLARY: Glucose-Capillary: 126 mg/dL — ABNORMAL HIGH (ref 70–99)

## 2013-11-29 MED ORDER — LEVALBUTEROL HCL 0.63 MG/3ML IN NEBU
0.6300 mg | INHALATION_SOLUTION | Freq: Three times a day (TID) | RESPIRATORY_TRACT | Status: DC
  Administered 2013-11-29 – 2013-12-03 (×11): 0.63 mg via RESPIRATORY_TRACT
  Filled 2013-11-29 (×17): qty 3

## 2013-11-29 MED ORDER — ALBUTEROL SULFATE (2.5 MG/3ML) 0.083% IN NEBU
2.5000 mg | INHALATION_SOLUTION | Freq: Four times a day (QID) | RESPIRATORY_TRACT | Status: DC
  Administered 2013-11-29 (×2): 2.5 mg via RESPIRATORY_TRACT
  Filled 2013-11-29 (×2): qty 3

## 2013-11-29 MED ORDER — FUROSEMIDE 10 MG/ML IJ SOLN
40.0000 mg | Freq: Once | INTRAMUSCULAR | Status: AC
  Administered 2013-11-29: 40 mg via INTRAVENOUS

## 2013-11-29 MED ORDER — ENOXAPARIN SODIUM 30 MG/0.3ML ~~LOC~~ SOLN
30.0000 mg | SUBCUTANEOUS | Status: DC
  Administered 2013-11-29 – 2013-12-02 (×4): 30 mg via SUBCUTANEOUS
  Filled 2013-11-29 (×6): qty 0.3

## 2013-11-29 NOTE — Progress Notes (Signed)
Patient ID: Marie Chavez, female   DOB: 1956/03/02, 59 y.o.   MRN: [id number]  SICU Evening Rounds:  Hemodynamically stable  Sats 100%  Excellent diuresis today    Up in chair.

## 2013-11-29 NOTE — Progress Notes (Signed)
Remaining 5 cc of Fentanyl PCA wasted in sink. Verified by Lilyan Gilford, RN and Everlene Other, RN.

## 2013-11-29 NOTE — Op Note (Signed)
Marie Chavez, Marie Chavez               ACCOUNT NO.:  [id number]  MEDICAL RECORD NO.:  [id number]  LOCATION:  2S02C                        FACILITY:  [facility]  PHYSICIAN:  Lanelle Bal, MD    DATE OF BIRTH:  05-18-1956  DATE OF PROCEDURE:  11/28/2013 DATE OF DISCHARGE:                              OPERATIVE REPORT   PREOPERATIVE DIAGNOSIS:  Slowly enlarging left upper lobe lung mass, question carcinoid.  POSTOPERATIVE DIAGNOSIS:  Slowly enlarging left upper lobe lung mass, question carcinoid.  SURGICAL PROCEDURE:  Bronchoscopy, left video-assisted thoracoscopy, minithoracotomy, left upper lobectomy with placement of On-Q device.  SURGEON:  Lanelle Bal, MD  FIRST ASSISTANT:  Suzzanne Cloud, P.A.  BRIEF HISTORY:  The patient is a 58 year old female who was initially seen by the Pulmonary Division in 2008, with a left upper lobe lung mass.  The mass was not hypermetabolic.  No further on PET scan.  No further workup was done and the patient appeared to have been lost to follow up.  She had fell several months ago in her tub at home and was seen in the emergency room.  A chest x-ray and subsequently a followup CT scan precipitated a referral back to Pulmonology.  She was then seen by Dr. Halford Chessman.  On serial CT scans over several ear period, the left upper lobe mass that was approximately 2-2.2 cm in size, had slowly enlarged. Again it was a followup PET scan, it was not hypermetabolic.  The patient had no hemoptysis, but because of the slowly enlarging size, a pulmonary neoplasm was concern specifically possible or low-grade malignancy.  Possibly carcinoid was discussed with the patient.  Because of the enlarging size and its positioned that any further enlargement would likely precipitate need for pneumonectomy rather than lobectomy to remove.  It was recommended to the patient that we proceed with left upper lobectomy.  Pulmonary function studies were adequate.  The patient agreed  and signed informed consent.  DESCRIPTION OF PROCEDURE:  After appropriate time-out, the patient underwent general endotracheal anesthesia without incident.  Through single-lumen fiberoptic bronchoscope, a 2-mm scope was passed.  The right tracheobronchial tree appeared clear of any lesions.  Careful examination of the left was carried out.  In the anterior segment of the left upper lobe bronchus, there appeared to be a smooth obstructing lesion and no attempt was made to biopsy this, but the appearance was that of a carcinoid.  There appeared to be plenty of bronchus to obtain an adequate resection.  The scope was then removed.  The patient was turned in lateral decubitus position with the left side up.  A small port incision was made in the fourth intercostal space midaxillary line and the scope was introduced.  The fissure was well developed.  Through the scope, mass itself could not be obviously seen.  A small thoracotomy incision was made through the port site.  In the small incision, we proceeded in dissecting out the hilum.  The superior pulmonary vein was encircled and divided with a gold tip vascular stapler.  We then dissected along the main pulmonary artery dividing the lingular branches of the pulmonary artery with vascular stapler and the superior branches, four total.  Upper lobe was easily identified.  A black stapler was placed across the bronchus.  Testing for filling of the lower lobe showed brisk refill of air in the lower lobe.  The bronchus was then divided.  The specimen was submitted to Pathology.  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  The bronchial stump was tested for without air leak.  A posterior Blake drain and anterior chest tube were placed through the port sites.  The incision was then closed with interrupted 0 Vicryl, running 3-0 Vicryl, running 2-0 Vicryl in the subcutaneous tissue, and  a 3-0 subcuticular stitch in the skin edges.  Dry dressings were applied. The patient tolerated the procedure without obvious complication. Sponge and needle count was reported as correct.  The patient was awakened and extubated in the operating room and transferred to the recovery room for postoperative observation.  Estimated blood loss was less than 100 mL.     Lanelle Bal, MD     EG/MEDQ  D:  11/28/2013  T:  11/29/2013  Job:  [id number]

## 2013-11-29 NOTE — Care Management Note (Addendum)
    Page 1 of 2   12/03/2013     12:12:43 PM   CARE MANAGEMENT NOTE 12/03/2013  Patient:  LAJOYCE, TAMURA   Account Number:  [id number]  Date Initiated:  11/29/2013  Documentation initiated by:  Elissa Hefty  Subjective/Objective Assessment:   adm w thoracotomy     Action/Plan:   lives w husband, pcp dr Jola Schmidt   Anticipated DC Date:  12/03/2013   Anticipated DC Plan:  [place]  CM consult      PAC Choice  DURABLE MEDICAL EQUIPMENT   Choice offered to / List presented to:  C-1 Patient   DME arranged  Vassie Moselle      DME agency  [place].        Status of service:  Completed, signed off Medicare Important Message given?  YES (If response is "NO", the following Medicare IM given date fields will be blank) Date Medicare IM given:  11/27/2013 Date Additional Medicare IM given:    Discharge Disposition:  HOME/SELF CARE  Per UR Regulation:  Reviewed for med. necessity/level of care/duration of stay  If discussed at [place] of Stay Meetings, dates discussed:    Comments:  12/03/13- 61- Marvetta Gibbons RN, BSN [phone] Referral received for home needs assessment- in to speak with pt at bedside- per conversation pt states that she lives with spouse and 15 yr old grandson- spouse will be able to assist at discharge- pt reports that she has been walking around the unit with the aide of a w/c- states that she sometimes feels unsteady- discussed using either a cane or a rw at home- pt feels like maybe a RW might be helpful- MD came in room to assess for d/c and agreed with RW for home- verbal order given.  Call made to [place] with [facility] for DME need - RW to be delivered to room prior to discharge. Pt reports that she will have a ride home and has been able to move about room unassisted- and no other d/c needs identified.

## 2013-11-29 NOTE — Anesthesia Postprocedure Evaluation (Signed)
Anesthesia Post Note  Patient: Marie Chavez  Procedure(s) Performed: Procedure(s) (LRB): VIDEO BRONCHOSCOPY (N/A) VIDEO ASSISTED THORACOSCOPY (VATS)  Lobectomy (Left)  Anesthesia type: general  Patient location: PACU  Post pain: Pain level controlled  Post assessment: Patient's Cardiovascular Status Stable  Post vital signs: Reviewed and stable  Level of consciousness: sedated  Complications: No apparent anesthesia complications

## 2013-11-29 NOTE — Progress Notes (Signed)
Pt c/o being acutely SOB- O2 sats are 100% on 4L. Auscultation revealed fine crackles in upper and lower lobes on the left. MD Servando Snare made aware. Orders for lasix received. This flagged an allergy warning in Epic- however upon a phone call to pharmacy- this was not accurate.   Orders for Xopenex also received  Lorre Munroe

## 2013-11-29 NOTE — Progress Notes (Signed)
ABG results as follows: pH 7.255, pCO2 63.4, pO2 97, HCO3 28.1, TCO2 30, O2 96. MD notified of pt's AM ABG results. Received orders to D/C PCA. We then stimulated the pt and got her up to the chair. Pt alert and oriented. Will continue to monitor.

## 2013-11-29 NOTE — Progress Notes (Signed)
Patient ID: Marie Chavez, female   DOB: 08-Jun-1956, 58 y.o.   MRN: [id number] TCTS DAILY ICU PROGRESS NOTE                   [address]            [place] [address]          [phone]   1 Day Post-Op Procedure(s) (LRB): VIDEO BRONCHOSCOPY (N/A) VIDEO ASSISTED THORACOSCOPY (VATS)  Lobectomy (Left)  Total Length of Stay:  LOS: 1 day   Subjective: Walked in unit , nausea last am  Objective: Vital signs in last 24 hours: Temp:  [97.4 F (36.3 C)-98.6 F (37 C)] 98.6 F (37 C) (02/05 0352) Pulse Rate:  [71-87] 76 (02/05 0700) Cardiac Rhythm:  [-] Normal sinus rhythm (02/05 0600) Resp:  [7-22] 10 (02/05 0700) BP: (96-133)/(57-88) 122/62 mmHg (02/05 0700) SpO2:  [92 %-100 %] 99 % (02/05 0700) Arterial Line BP: (115-135)/(56-65) 115/56 mmHg (02/04 1500) FiO2 (%):  [35 %-50 %] 35 % (02/04 2022) Weight:  [216 lb 0.8 oz (98 kg)] 216 lb 0.8 oz (98 kg) (02/05 0500)  Filed Weights   11/29/13 0500  Weight: 216 lb 0.8 oz (98 kg)    Weight change:    Hemodynamic parameters for last 24 hours:    Intake/Output from previous day: 02/04 0701 - 02/05 0700 In: 3306 [I.V.:3306] Out: 1570 [Urine:1060; Blood:100; Chest Tube:410]  Intake/Output this shift:    Current Meds: Scheduled Meds: . acetaminophen  1,000 mg Oral Q6H   Or  . acetaminophen (TYLENOL) oral liquid 160 mg/5 mL  1,000 mg Oral Q6H  . albuterol  2.5 mg Nebulization QID  . atenolol  50 mg Oral Daily  . bisacodyl  10 mg Oral Daily  . escitalopram  10 mg Oral QHS  . levothyroxine  150 mcg Oral QAC breakfast  . pantoprazole  80 mg Oral Q1200   Continuous Infusions: . dextrose 5 % and 0.45 % NaCl with KCl 20 mEq/L 100 mL/hr at 11/29/13 0600   PRN Meds:.clonazePAM, ondansetron (ZOFRAN) IV, oxyCODONE, oxyCODONE-acetaminophen, potassium chloride, senna-docusate, traMADol  General appearance: alert and no distress Neurologic: intact Heart: regular rate and rhythm, S1, S2 normal, no murmur,  click, rub or gallop Lungs: diminished breath sounds bibasilar Abdomen: soft, non-tender; bowel sounds normal; no masses,  no organomegaly Extremities: extremities normal, atraumatic, no cyanosis or edema and Homans sign is negative, no sign of DVT Wound: small air leak with cough  Lab Results: CBC: Recent Labs  11/27/13 1357 11/29/13 0415  WBC 7.2 9.0  HGB 13.6 10.0*  HCT 38.3 30.5*  PLT 198 155   BMET:  Recent Labs  11/27/13 1357 11/29/13 0415  NA 141 139  K 3.9 3.8  CL 102 105  CO2 22 23  GLUCOSE 82 122*  BUN 13 9  CREATININE 0.60 0.47*  CALCIUM 9.7 7.2*    PT/INR:  Recent Labs  11/27/13 1357  LABPROT 12.8  INR 0.98   Radiology: Dg Chest 2 View  11/27/2013   CLINICAL DATA:  Pre-admit for video Skopic bronchoscopy, cough  EXAM: CHEST  2 VIEW  COMPARISON:  NM PET IMAGE INITIAL (PI) SKULL BASE TO THIGH dated 11/08/2013; CT CHEST W/O CM dated 10/29/2013  FINDINGS: There is a 2 cm left upper lobe pulmonary nodule. There is no focal parenchymal opacity, pleural effusion, or pneumothorax. The heart and mediastinal contours are unremarkable.  The osseous structures are unremarkable.  IMPRESSION: No active cardiopulmonary  disease.  2 cm left upper lobe pulmonary nodule better characterized on recent PET-CT dated 11/08/2013.   Electronically Signed   By: Kathreen Devoid   On: 11/27/2013 15:53   Dg Chest Port 1 View  11/29/2013   CLINICAL DATA:  Lung mass post VAD TIS  EXAM: PORTABLE CHEST - 1 VIEW  COMPARISON:  Portable exam 1610 hr compared to 11/28/2013  Correlation: PET-CT 11/08/2013  FINDINGS: Right jugular central venous catheter with tip projecting over SVC.  Left thoracostomy tubes remain.  Enlargement of cardiac silhouette.  Prominence of the mediastinum unchanged.  Slight narrowing of the trachea with displacement right to left, though no a right thyroid mass or adenopathy identified on recent PET-CT, question related to slight head rotation.  Minimal right base atelectasis.  Left  lung grossly clear.  No pleural effusion or pneumothorax.  Improved pulmonary infiltrates since previous exam.  IMPRESSION: Improved pulmonary infiltrates.  Minimal right base atelectasis.  Enlargement of cardiac silhouette.   Electronically Signed   By: Lavonia Dana M.D.   On: 11/29/2013 08:11   Dg Chest Portable 1 View  11/28/2013   CLINICAL DATA:  Status post thoracotomy  EXAM: PORTABLE CHEST - 1 VIEW  COMPARISON:  11/27/2013  FINDINGS: Left-sided chest tubes are now seen. Postsurgical changes are noted on the left. No pneumothorax is seen. A right central venous line is noted at the cavoatrial junction. The cardiac shadow is accentuated due to the portable technique. Poor inspiratory effort is noted with crowding of the vascular markings in the right lung.  IMPRESSION: Postsurgical change on the left.  No acute abnormality is noted.   Electronically Signed   By: Inez Catalina M.D.   On: 11/28/2013 14:46     Assessment/Plan: S/P Procedure(s) (LRB): VIDEO BRONCHOSCOPY (N/A) VIDEO ASSISTED THORACOSCOPY (VATS)  Lobectomy (Left) Mobilize Diuresis Continue foley due to strict I&O     Marie Chavez B 11/29/2013 8:29 AM

## 2013-11-30 ENCOUNTER — Inpatient Hospital Stay (HOSPITAL_COMMUNITY): Payer: Medicare Other

## 2013-11-30 DIAGNOSIS — R918 Other nonspecific abnormal finding of lung field: Secondary | ICD-10-CM | POA: Diagnosis not present

## 2013-11-30 LAB — URINE CULTURE
Colony Count: NO GROWTH
Culture: NO GROWTH

## 2013-11-30 LAB — COMPREHENSIVE METABOLIC PANEL
ALBUMIN: 3.2 g/dL — AB (ref 3.5–5.2)
ALT: 31 U/L (ref 0–35)
AST: 21 U/L (ref 0–37)
Alkaline Phosphatase: 61 U/L (ref 39–117)
BUN: 6 mg/dL (ref 6–23)
CO2: 30 mEq/L (ref 19–32)
Calcium: 8.8 mg/dL (ref 8.4–10.5)
Chloride: 100 mEq/L (ref 96–112)
Creatinine, Ser: 0.58 mg/dL (ref 0.50–1.10)
GFR calc Af Amer: >90 mL/min (ref >90)
GFR calc non Af Amer: >90 mL/min (ref >90)
Glucose, Bld: 112 mg/dL — ABNORMAL HIGH (ref 70–99)
Potassium: 4.3 mEq/L (ref 3.7–5.3)
Sodium: 139 mEq/L (ref 137–147)
Total Bilirubin: 0.6 mg/dL (ref 0.3–1.2)
Total Protein: 6.6 g/dL (ref 6.0–8.3)

## 2013-11-30 LAB — CBC
HCT: 34 % — ABNORMAL LOW (ref 36.0–46.0)
Hemoglobin: 11.3 g/dL — ABNORMAL LOW (ref 12.0–15.0)
MCH: 32.6 pg (ref 26.0–34.0)
MCHC: 33.2 g/dL (ref 30.0–36.0)
MCV: 98 fL (ref 78.0–100.0)
Platelets: 157 10*3/uL (ref 150–400)
RBC: 3.47 MIL/uL — ABNORMAL LOW (ref 3.87–5.11)
RDW: 15 % (ref 11.5–15.5)
WBC: 9.5 10*3/uL (ref 4.0–10.5)

## 2013-11-30 MED ORDER — DIPHENHYDRAMINE HCL 25 MG PO CAPS
25.0000 mg | ORAL_CAPSULE | Freq: Three times a day (TID) | ORAL | Status: DC | PRN
  Administered 2013-11-30 – 2013-12-02 (×5): 25 mg via ORAL
  Filled 2013-11-30 (×5): qty 1

## 2013-12-01 ENCOUNTER — Inpatient Hospital Stay (HOSPITAL_COMMUNITY): Payer: Medicare Other

## 2013-12-01 DIAGNOSIS — R918 Other nonspecific abnormal finding of lung field: Secondary | ICD-10-CM | POA: Diagnosis not present

## 2013-12-01 DIAGNOSIS — Z4682 Encounter for fitting and adjustment of non-vascular catheter: Secondary | ICD-10-CM | POA: Diagnosis not present

## 2013-12-01 LAB — CBC
HCT: 33.4 % — ABNORMAL LOW (ref 36.0–46.0)
Hemoglobin: 11.1 g/dL — ABNORMAL LOW (ref 12.0–15.0)
MCH: 32.6 pg (ref 26.0–34.0)
MCHC: 33.2 g/dL (ref 30.0–36.0)
MCV: 97.9 fL (ref 78.0–100.0)
Platelets: 167 10*3/uL (ref 150–400)
RBC: 3.41 MIL/uL — ABNORMAL LOW (ref 3.87–5.11)
RDW: 14.6 % (ref 11.5–15.5)
WBC: 7 10*3/uL (ref 4.0–10.5)

## 2013-12-01 LAB — BASIC METABOLIC PANEL
BUN: 7 mg/dL (ref 6–23)
CO2: 31 mEq/L (ref 19–32)
Calcium: 8.7 mg/dL (ref 8.4–10.5)
Chloride: 99 mEq/L (ref 96–112)
Creatinine, Ser: 0.61 mg/dL (ref 0.50–1.10)
GFR calc Af Amer: >90 mL/min (ref >90)
GFR calc non Af Amer: >90 mL/min (ref >90)
Glucose, Bld: 109 mg/dL — ABNORMAL HIGH (ref 70–99)
Potassium: 4.1 mEq/L (ref 3.7–5.3)
Sodium: 140 mEq/L (ref 137–147)

## 2013-12-01 MED ORDER — HYDROCODONE-ACETAMINOPHEN 5-325 MG PO TABS
1.0000 | ORAL_TABLET | ORAL | Status: DC | PRN
  Administered 2013-12-01: 2 via ORAL
  Administered 2013-12-01: 1 via ORAL
  Administered 2013-12-02 – 2013-12-03 (×3): 2 via ORAL
  Filled 2013-12-01 (×3): qty 2
  Filled 2013-12-01: qty 1
  Filled 2013-12-01: qty 2

## 2013-12-01 NOTE — Progress Notes (Addendum)
[address]       [place] [address]             [phone]          3 Days Post-Op Procedure(s) (LRB): VIDEO BRONCHOSCOPY (N/A) VIDEO ASSISTED THORACOSCOPY (VATS)  Lobectomy (Left)  Subjective: Sore at CT site, otherwise feels well.   Objective: Vital signs in last 24 hours: Patient Vitals for the past 24 hrs:  BP Temp Temp src Pulse Resp SpO2  12/01/13 0807 - - - 87 12 96 %  12/01/13 0732 125/64 mmHg - - 72 15 90 %  12/01/13 0700 - 98.7 F (37.1 C) Oral - - -  12/01/13 0332 117/67 mmHg 97.5 F (36.4 C) Oral 73 14 94 %  11/30/13 2316 111/96 mmHg 98.2 F (36.8 C) Oral 69 13 94 %  11/30/13 2100 - - - - - 100 %  11/30/13 2020 145/70 mmHg - - 72 19 99 %  11/30/13 1940 145/70 mmHg 97.7 F (36.5 C) Oral 72 11 97 %  11/30/13 1845 - - - 74 19 96 %  11/30/13 1825 122/68 mmHg - - 73 12 98 %  11/30/13 1820 - - - 73 12 -  11/30/13 1800 - - - 73 19 98 %  11/30/13 1700 - - - 42 - 100 %  11/30/13 1605 110/56 mmHg - - 74 13 96 %  11/30/13 1600 - - - 72 10 96 %  11/30/13 1500 116/73 mmHg 98.7 F (37.1 C) Oral 72 15 98 %  11/30/13 1430 - - - 70 16 98 %  11/30/13 1400 123/80 mmHg - - 146 17 94 %  11/30/13 1300 119/73 mmHg - - 67 16 96 %  11/30/13 1200 - - - 66 11 97 %  11/30/13 1130 109/71 mmHg - - 68 8 -  11/30/13 1115 119/64 mmHg - - 70 20 -  11/30/13 1100 118/67 mmHg - - 70 16 99 %  11/30/13 1045 116/70 mmHg - - 73 13 98 %  11/30/13 1030 123/78 mmHg - - - 8 -  11/30/13 1025 126/70 mmHg - - - 12 -  11/30/13 1000 - - - 84 18 95 %  11/30/13 0900 118/62 mmHg - - 83 12 99 %   Current Weight  11/30/13 213 lb 3 oz (96.7 kg)     Intake/Output from previous day: 02/06 0701 - 02/07 0700 In: 1760 [P.O.:560; I.V.:1200] Out: 2835 [Urine:2605; Chest Tube:230]    PHYSICAL EXAM:  Heart: RRR Lungs: Diminished BS in L base Wound: Dressed and dry Chest tube: No air leak    Lab Results: CBC: Recent Labs  11/30/13 0410 12/01/13 0603  WBC 9.5  7.0  HGB 11.3* 11.1*  HCT 34.0* 33.4*  PLT 157 167   BMET:  Recent Labs  11/30/13 0410 12/01/13 0603  NA 139 140  K 4.3 4.1  CL 100 99  CO2 30 31  GLUCOSE 112* 109*  BUN 6 7  CREATININE 0.58 0.61  CALCIUM 8.8 8.7    PT/INR: No results found for this basename: LABPROT, INR,  in the last 72 hours  CXR: FINDINGS:  There is been interval removal of 1 of the left thoracostomy  catheters. The residual catheter is within normal limits. No  pneumothorax is seen. A right-sided central venous line is again noted. The right lung remains clear. The cardiac shadow is stable.  IMPRESSION:  No pneumothorax following removal of a left thoracostomy  catheter.   Assessment/Plan: S/P Procedure(s) (LRB): VIDEO BRONCHOSCOPY (N/A) VIDEO ASSISTED THORACOSCOPY (VATS)  Lobectomy (Left) CT output decreasing, no air leak.  CXR stable.  Possibly can d/c remaining CT soon. D/c On-Q, IV to saline lock. Ambulate, continue pulm toilet.   LOS: 3 days    COLLINS,GINA H 12/01/2013  I have seen and examined the patient and agree with the assessment and plan as outlined.  Possibly d/c last tube in am tomorrow if serous output decreased.  Concepcion Gillott H 12/01/2013 11:36 AM

## 2013-12-02 ENCOUNTER — Inpatient Hospital Stay (HOSPITAL_COMMUNITY): Payer: Medicare Other

## 2013-12-02 DIAGNOSIS — J9383 Other pneumothorax: Secondary | ICD-10-CM | POA: Diagnosis not present

## 2013-12-02 DIAGNOSIS — C3490 Malignant neoplasm of unspecified part of unspecified bronchus or lung: Secondary | ICD-10-CM | POA: Diagnosis not present

## 2013-12-02 NOTE — Discharge Summary (Signed)
[address]       [place] [address]             [phone]              Discharge Summary  Name: Marie Chavez DOB: [birth year] 58 y.o. 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 Hypertension   . Panic disorder   . Herniated disc   . DJD (degenerative joint disease)   . PONV (postoperative nausea and vomiting)     Had a panic attack upon awakening from Hysterectomy   . Tachycardia     Takes Atenolol  . Bronchitis     Hx of  . Environmental allergies   . Seasonal allergies   . Pneumonia     Hx of "walking Pneumonia"   . Shortness of breath     when bending over or just sitting thinks it comes from lung mass  . Depression   . GERD (gastroesophageal reflux disease)   . Hypothyroidism   . Hypoglycemia   . Urgency of urination   . Dry skin      Procedures: VIDEO BRONCHOSCOPY LEFT VIDEO ASSISTED THORACOSCOPY/MINI-THORACOTOMY, LEFT UPPER LOBECTOMY - 11/28/2013    HPI:  The patient is a 58 y.o. female who was referred to Dr. Servando Snare for evaluation of a left upper lobe mass.  The lesion in question has been present since 2008. At that time, she fell in the tub at home suffering a chest injury, and a 1.5 cm left upper lobe lung mass was noted on CT scan. She was referred to Dr. Lyda Jester, but at that time, a PET scan showed no metabolic activity, so no further workup was done and she was lost to follow up. She fell again several months ago, and a chest x-ray and subsequent CT scan in the ER again revealed a left upper lobe mass.  She was referred to Dr. Halford Chessman, and has had serial CTs since then, showing slow enlargement of the area.  She had a recent upper respiratory infection, and at the time of evaluation a chest x-ray was done, which showed  increase in the left upper lobe lung mass.  There was concern that this represented a neoplasm, and it was felt that she should undergo surgical resection at this time. All risks, benefits and alternatives of surgery were explained in detail, and the patient agreed to proceed.    Hospital Course:  The patient was admitted to [facility] on 11/28/2013. The patient was taken to the operating room and underwent the above procedure.    The postoperative course has generally been uneventful.  Her chest tubes have been removed in the standard fashion, and chest x-rays have been stable. Incisions are healing well.  She is ambulating in the halls and tolerating a diet.  Pathology revealed a well differentiated neuroendocrine tumor (Carcinoid).  She has remained afebrile and vital signs are stable.  She has been evaluated on today's date and is medically stable for discharge home.    Recent vital signs:  Filed Vitals:   12/02/13 0726  BP:   Pulse:   Temp: 97.4 F (36.3 C)  Resp:     Recent laboratory studies:  CBC: Recent Labs  11/30/13 0410 12/01/13 0603  WBC 9.5  7.0  HGB 11.3* 11.1*  HCT 34.0* 33.4*  PLT 157 167   BMET:  Recent Labs  11/30/13 0410 12/01/13 0603  NA 139 140  K 4.3 4.1  CL 100 99  CO2 30 31  GLUCOSE 112* 109*  BUN 6 7  CREATININE 0.58 0.61  CALCIUM 8.8 8.7    PT/INR: No results found for this basename: LABPROT, INR,  in the last 72 hours   Discharge Medications:     Medication List         atenolol 50 MG tablet  Commonly known as:  TENORMIN  Take 50 mg by mouth daily.     clonazePAM 1 MG tablet  Commonly known as:  KLONOPIN  Take 1 mg by mouth 2 (two) times daily as needed for anxiety.     EPIPEN 2-PAK 0.3 mg/0.3 mL Soaj injection  Generic drug:  EPINEPHrine  Inject 0.3 mg as directed once. As needed     escitalopram 10 MG tablet  Commonly known as:  LEXAPRO  Take 10 mg by mouth at bedtime.     esomeprazole 40 MG capsule  Commonly known as:   NEXIUM  Take 40 mg by mouth daily before breakfast.     estradiol 0.05 MG/24HR patch  Commonly known as:  VIVELLE-DOT  Place 1 patch onto the skin once a week. On Saturday     fluticasone 50 MCG/ACT nasal spray  Commonly known as:  FLONASE  Place 2 sprays into both nostrils daily as needed for allergies.     gabapentin 300 MG capsule  Commonly known as:  NEURONTIN  Take 300 mg by mouth at bedtime as needed.     HYDROcodone-acetaminophen 5-325 MG per tablet  Commonly known as:  NORCO/VICODIN  Take 1-2 tablets by mouth every 4 (four) hours as needed for moderate pain.     levothyroxine 150 MCG tablet  Commonly known as:  SYNTHROID, LEVOTHROID  Take 150 mcg by mouth daily before breakfast.     lisinopril 5 MG tablet  Commonly known as:  PRINIVIL,ZESTRIL  Take 5 mg by mouth daily.     mometasone 50 MCG/ACT nasal spray  Commonly known as:  NASONEX  Place 2 sprays into both nostrils daily.     nabumetone 750 MG tablet  Commonly known as:  RELAFEN  Take 750 mg by mouth at bedtime as needed for mild pain.     promethazine 25 MG tablet  Commonly known as:  PHENERGAN  Take 25 mg by mouth every 6 (six) hours as needed for nausea or vomiting.         Discharge Instructions:  The patient is to refrain from driving, heavy lifting or strenuous activity.  May shower daily and clean incisions with soap and water.  May resume regular diet.   Follow Up:       Future Appointments Provider Department Dept Phone   12/11/2013 9:30 AM Chesley Mires, MD [facility] [phone]     Follow-up Information   Follow up with GERHARDT,EDWARD B, MD In 2 weeks. (Office will contact you with an appointment)    Specialty:  Cardiothoracic Surgery   Contact information:   [address] [place] [address] [phone]       Follow up with Chesley Mires, MD. (As directed)    Specialty:  Pulmonary Disease   Contact information:   [address] [place]  [address] [phone]          COLLINS,GINA H  12/02/2013, 10:56 AM

## 2013-12-02 NOTE — Progress Notes (Addendum)
       [address]       [place] [address]             [phone]          4 Days Post-Op Procedure(s) (LRB): VIDEO BRONCHOSCOPY (N/A) VIDEO ASSISTED THORACOSCOPY (VATS)  Lobectomy (Left)  Subjective: 'I had a terrible night."  Slept poorly, had a lot of pain despite multiple pain meds.  Also c/o itching.   Objective: Vital signs in last 24 hours: Patient Vitals for the past 24 hrs:  BP Temp Temp src Pulse Resp SpO2  12/02/13 0726 - 97.4 F (36.3 C) Oral - - 91 %  12/02/13 0300 126/75 mmHg - - 75 20 94 %  12/02/13 0258 126/75 mmHg 97.9 F (36.6 C) Oral 74 18 93 %  12/02/13 0013 153/93 mmHg 98.1 F (36.7 C) Oral 72 14 100 %  12/01/13 1946 - - - - - 100 %  12/01/13 1923 111/66 mmHg 97.8 F (36.6 C) Oral 71 18 99 %  12/01/13 1700 114/61 mmHg 97.9 F (36.6 C) Oral 68 11 95 %  12/01/13 1359 - - - - - 96 %  12/01/13 1114 120/82 mmHg - - 73 14 91 %  12/01/13 1100 - 97.9 F (36.6 C) Oral - - -  12/01/13 1004 - - - - - 93 %   Current Weight  11/30/13 213 lb 3 oz (96.7 kg)     Intake/Output from previous day: 02/07 0701 - 02/08 0700 In: 720 [P.O.:720] Out: 2155 [Urine:1975; Chest Tube:180]    PHYSICAL EXAM:  Heart: RRR Lungs: Clear, slightly decreased in bases Wound: Dressed and dry Chest tube: No air leak    Lab Results: CBC: Recent Labs  11/30/13 0410 12/01/13 0603  WBC 9.5 7.0  HGB 11.3* 11.1*  HCT 34.0* 33.4*  PLT 157 167   BMET:  Recent Labs  11/30/13 0410 12/01/13 0603  NA 139 140  K 4.3 4.1  CL 100 99  CO2 30 31  GLUCOSE 112* 109*  BUN 6 7  CREATININE 0.58 0.61  CALCIUM 8.8 8.7    PT/INR: No results found for this basename: LABPROT, INR,  in the last 72 hours  CXR: FINDINGS:  A right-sided central venous line is again noted in the distal  superior vena cava. A left chest tube is again seen. A tiny left  apical pneumothorax is seen. Mild left basilar atelectasis is noted.  IMPRESSION:  Tiny left apical  pneumothorax.   Assessment/Plan: S/P Procedure(s) (LRB): VIDEO BRONCHOSCOPY (N/A) VIDEO ASSISTED THORACOSCOPY (VATS)  Lobectomy (Left) CT output 160, 20 ml over past 2 shifts.  CXR stable.  Hopefully can d/c CT today. Continue pulm toilet, ambulation. Pain control has been an issue, now she is back on Vicodin, which she takes at home.  Hopefully if her CT can come out, this will help.  Continue to monitor.   LOS: 4 days    COLLINS,GINA H 12/02/2013  I have seen and examined the patient and agree with the assessment and plan as outlined.  Will d/c chest tube.  Safire Gordin H 12/02/2013 11:25 AM

## 2013-12-02 NOTE — Progress Notes (Signed)
Pt CT d/c per MD order, at 1205, pt tol well, pt educated to call nursing if any changes from baseline ie SOB,CP, nursing will cont to follow

## 2013-12-03 ENCOUNTER — Encounter (HOSPITAL_COMMUNITY): Payer: Self-pay | Admitting: Cardiothoracic Surgery

## 2013-12-03 ENCOUNTER — Inpatient Hospital Stay (HOSPITAL_COMMUNITY): Payer: Medicare Other

## 2013-12-03 DIAGNOSIS — C3490 Malignant neoplasm of unspecified part of unspecified bronchus or lung: Secondary | ICD-10-CM | POA: Diagnosis not present

## 2013-12-03 MED ORDER — HYDROCODONE-ACETAMINOPHEN 5-325 MG PO TABS: 1.0000 | ORAL_TABLET | ORAL | Status: DC | PRN

## 2013-12-03 NOTE — Progress Notes (Addendum)
TCTS DAILY ICU PROGRESS NOTE                   [address]            [place] [address]          [phone]   5 Days Post-Op Procedure(s) (LRB): VIDEO BRONCHOSCOPY (N/A) VIDEO ASSISTED THORACOSCOPY (VATS)  Lobectomy (Left)  Total Length of Stay:  LOS: 5 days   Subjective: conts to feel better/stronger  Objective: Vital signs in last 24 hours: Temp:  [97.5 F (36.4 C)-98.6 F (37 C)] 98.4 F (36.9 C) (02/09 0754) Pulse Rate:  [63-69] 69 (02/09 0332) Cardiac Rhythm:  [-] Normal sinus rhythm (02/08 2332) Resp:  [12-18] 16 (02/09 0332) BP: (115-130)/(58-94) 130/68 mmHg (02/09 0332) SpO2:  [96 %-100 %] 96 % (02/09 0332)  Filed Weights   11/29/13 0500 11/30/13 0700  Weight: 216 lb 0.8 oz (98 kg) 213 lb 3 oz (96.7 kg)    Weight change:    Hemodynamic parameters for last 24 hours:    Intake/Output from previous day: 02/08 0701 - 02/09 0700 In: 1320 [P.O.:1320] Out: 720 [Urine:600; Chest Tube:120]  Intake/Output this shift:    Current Meds: Scheduled Meds: . atenolol  50 mg Oral Daily  . bisacodyl  10 mg Oral Daily  . enoxaparin (LOVENOX) injection  30 mg Subcutaneous Q24H  . escitalopram  10 mg Oral QHS  . levalbuterol  0.63 mg Nebulization Q8H  . levothyroxine  150 mcg Oral QAC breakfast  . pantoprazole  80 mg Oral Q1200   Continuous Infusions:  PRN Meds:.clonazePAM, diphenhydrAMINE, HYDROcodone-acetaminophen, ondansetron (ZOFRAN) IV, potassium chloride, senna-docusate, traMADol  General appearance: alert, cooperative and no distress Heart: regular rate and rhythm Lungs: clear to auscultation bilaterally Abdomen: benign Extremities: npo edema Wound: incis healing well  Lab Results: CBC: Recent Labs  12/01/13 0603  WBC 7.0  HGB 11.1*  HCT 33.4*  PLT 167   BMET:  Recent Labs  12/01/13 0603  NA 140  K 4.1  CL 99  CO2 31  GLUCOSE 109*  BUN 7  CREATININE 0.61  CALCIUM 8.7    PT/INR: No results found for this basename:  LABPROT, INR,  in the last 72 hours Radiology: Dg Chest 2 View  12/03/2013   CLINICAL DATA:  Lung cancer post VATS  EXAM: CHEST  2 VIEW  COMPARISON:  12/02/2013  FINDINGS: Right jugular line stable tip projecting over SVC.  Enlargement of cardiac silhouette.  Persistent atelectasis within left lung.  Persistent left hydropneumothorax unchanged.  Right lung remains clear.  No acute osseous findings.  IMPRESSION: Persistent left hydropneumothorax unchanged.   Electronically Signed   By: Lavonia Dana M.D.   On: 12/03/2013 07:46   Dg Chest 2 View  12/02/2013   CLINICAL DATA:  Status post VATS  EXAM: CHEST  2 VIEW  COMPARISON:  12/01/2013  FINDINGS: A right-sided central venous line is again noted in the distal superior vena cava. A left chest tube is again seen. A tiny left apical pneumothorax is seen. Mild left basilar atelectasis is noted.  IMPRESSION: Tiny left apical pneumothorax.   Electronically Signed   By: Inez Catalina M.D.   On: 12/02/2013 09:08   Dg Chest Port 1 View  12/02/2013   CLINICAL DATA:  Lung cancer.  Chest tube removal.  EXAM: PORTABLE CHEST - 1 VIEW  COMPARISON:  DG CHEST 2 VIEW dated 12/02/2013; DG CHEST 1V PORT dated 12/01/2013  FINDINGS: 1306 hr.  Left chest tube has been removed in the interval. Right IJ central venous catheter appears unchanged. There are lower lung volumes with a probable small left pleural effusion. 5% left apical pneumothorax is unchanged. There is no right-sided pneumothorax. Mild atelectasis is present at both lung bases. The heart size and mediastinal contours are stable.  IMPRESSION: Stable small left hydropneumothorax following chest removal. Worsened basilar aeration.   Electronically Signed   By: Camie Patience M.D.   On: 12/02/2013 13:43     Assessment/Plan: S/P Procedure(s) (LRB): VIDEO BRONCHOSCOPY (N/A) VIDEO ASSISTED THORACOSCOPY (VATS)  Lobectomy (Left) 1 Clinically conts to progress nicely 2 she is trying to work out some assistance at home- will ask care  manager to see if she qualifies for anything that may help 3 push rehab/pulm toilet as able  GOLD,WAYNE E 12/03/2013 8:08 AM  Patient walking around unit 300 feet without SOB, off o2 Chest xray stable withsmall apical space Plan home this afternoon I have seen and examined Marie Chavez and agree with the above assessment  and plan.  Grace Isaac MD [facility] [phone] Office [phone] 12/03/2013 10:21 AM

## 2013-12-03 NOTE — Progress Notes (Signed)
Utilization review completed.  

## 2013-12-10 DIAGNOSIS — F410 Panic disorder [episodic paroxysmal anxiety] without agoraphobia: Secondary | ICD-10-CM | POA: Diagnosis not present

## 2013-12-11 ENCOUNTER — Ambulatory Visit: Payer: Medicare Other | Admitting: Pulmonary Disease

## 2013-12-17 ENCOUNTER — Other Ambulatory Visit: Payer: Self-pay | Admitting: *Deleted

## 2013-12-17 DIAGNOSIS — R918 Other nonspecific abnormal finding of lung field: Secondary | ICD-10-CM

## 2013-12-20 ENCOUNTER — Ambulatory Visit: Payer: Medicare Other | Admitting: Pulmonary Disease

## 2013-12-20 ENCOUNTER — Ambulatory Visit: Payer: Medicare Other | Admitting: Cardiothoracic Surgery

## 2013-12-21 ENCOUNTER — Ambulatory Visit: Payer: Medicare Other | Admitting: Cardiothoracic Surgery

## 2013-12-21 NOTE — Progress Notes (Deleted)
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  SURGICAL PROCEDURE: Bronchoscopy, left video-assisted thoracoscopy,  minithoracotomy, left upper lobectomy with placement of On-Q device.  SURGEON: Lanelle Bal, MD  PATH:WELL DIFFERENTIATED, LOW GRADE NEUROENDOCRINE TUMOR (CARCINOID  History of Present Illness:             Zubrod Score: At the time of surgery this patient's most appropriate activity status/level should be described as: []     0    Normal activity, no symptoms []     1    Restricted in physical strenuous activity but ambulatory, able to do out light work []     2    Ambulatory and capable of self care, unable to do work activities, up and about                 >50 % of waking hours                                                                                   []     3    Only limited self care, in bed greater than 50% of waking hours []     4    Completely disabled, no self care, confined to bed or chair []     5    Moribund  History  Smoking status  . Never Smoker   Smokeless tobacco  . Not on file       Allergies  Allergen Reactions  . Bee Venom Anaphylaxis and Other (See Comments)    Developed "blue rings" all over body after being stung  . Celebrex [Celecoxib]     REACTION: heart rate increases, itching  . Mobic [Meloxicam] Itching    HEART RACES  . Prednisone     REACTION: Heart rate increases, itching    Current Outpatient Prescriptions  Medication Sig Dispense Refill    . atenolol (TENORMIN) 50 MG tablet Take 50 mg by mouth daily.        . clonazePAM (KLONOPIN) 1 MG tablet Take 1 mg by mouth 2 (two) times daily as needed for anxiety.       Marland Kitchen EPIPEN 2-PAK 0.3 MG/0.3ML SOAJ injection Inject 0.3 mg as directed once. As needed      . escitalopram (LEXAPRO) 10 MG tablet Take 10 mg by mouth at bedtime.       Marland Kitchen esomeprazole (NEXIUM) 40 MG capsule Take 40 mg by mouth daily before breakfast.        . estradiol (VIVELLE-DOT) 0.05 MG/24HR Place 1 patch onto the skin once a week. On Saturday      .  fluticasone (FLONASE) 50 MCG/ACT nasal spray Place 2 sprays into both nostrils daily as needed for allergies.       Marland Kitchen gabapentin (NEURONTIN) 300 MG capsule Take 300 mg by mouth at bedtime as needed.      Marland Kitchen HYDROcodone-acetaminophen (NORCO/VICODIN) 5-325 MG per tablet Take 1-2 tablets by mouth every 4 (four) hours as needed for moderate pain.  50 tablet  0  . levothyroxine (SYNTHROID, LEVOTHROID) 150 MCG tablet Take 150 mcg by mouth daily before breakfast.      . lisinopril (PRINIVIL,ZESTRIL) 5 MG tablet Take 5 mg by mouth daily.       . mometasone (NASONEX) 50 MCG/ACT nasal spray Place 2 sprays into both nostrils daily.      . nabumetone (RELAFEN) 750 MG tablet Take 750 mg by mouth at bedtime as needed for mild pain.       . promethazine (PHENERGAN) 25 MG tablet Take 25 mg by mouth every 6 (six) hours as needed for nausea or vomiting.       No current facility-administered medications for this visit.       Physical Exam: There were no vitals taken for this visit.  {Physical ASUO:1561537} Wounds:  Diagnostic Studies & Laboratory data:         Recent Radiology Findings: No results found.    Recent Labs: Lab Results  Component Value Date   WBC 7.0 12/01/2013   HGB 11.1* 12/01/2013   HCT 33.4* 12/01/2013   PLT 167 12/01/2013   GLUCOSE 109* 12/01/2013   ALT 31 11/30/2013   AST 21 11/30/2013   NA 140 12/01/2013   K 4.1 12/01/2013   CL 99 12/01/2013   CREATININE 0.61 12/01/2013    BUN 7 12/01/2013   CO2 31 12/01/2013   INR 0.98 11/27/2013      Assessment / Plan:            Kingsly Kloepfer B 12/21/2013 12:11 PM         This encounter was created in error - please disregard. This encounter was created in error - please disregard.

## 2013-12-24 ENCOUNTER — Ambulatory Visit: Payer: Medicare Other | Admitting: Cardiothoracic Surgery

## 2013-12-24 ENCOUNTER — Other Ambulatory Visit: Payer: Self-pay

## 2013-12-24 DIAGNOSIS — G8918 Other acute postprocedural pain: Secondary | ICD-10-CM

## 2013-12-24 MED ORDER — TRAMADOL HCL 50 MG PO TABS: 50.0000 mg | ORAL_TABLET | Freq: Four times a day (QID) | ORAL | Status: DC | PRN

## 2013-12-24 NOTE — Telephone Encounter (Signed)
RX for Tramadol 50 mg 1 tablet po every 6 hours prn/pain #40 no refills Faxed to [facility] [phone]

## 2013-12-25 ENCOUNTER — Other Ambulatory Visit: Payer: Self-pay | Admitting: *Deleted

## 2013-12-25 DIAGNOSIS — G8918 Other acute postprocedural pain: Secondary | ICD-10-CM

## 2013-12-25 MED ORDER — TRAMADOL HCL 50 MG PO TABS: 50.0000 mg | ORAL_TABLET | Freq: Four times a day (QID) | ORAL | Status: DC | PRN

## 2013-12-27 ENCOUNTER — Encounter: Payer: Self-pay | Admitting: Cardiothoracic Surgery

## 2013-12-27 ENCOUNTER — Ambulatory Visit
Admission: RE | Admit: 2013-12-27 | Discharge: 2013-12-27 | Disposition: A | Discharge status: Home / Self Care | Payer: Medicare Other | Source: Ambulatory Visit | Attending: Cardiothoracic Surgery | Admitting: Cardiothoracic Surgery

## 2013-12-27 ENCOUNTER — Ambulatory Visit (INDEPENDENT_AMBULATORY_CARE_PROVIDER_SITE_OTHER): Payer: Self-pay | Admitting: Cardiothoracic Surgery

## 2013-12-27 ENCOUNTER — Other Ambulatory Visit: Payer: Self-pay | Admitting: *Deleted

## 2013-12-27 VITALS — BP 131/72 | HR 61 | Resp 18 | Ht 65.0 in | Wt 213.0 lb

## 2013-12-27 DIAGNOSIS — G8918 Other acute postprocedural pain: Secondary | ICD-10-CM

## 2013-12-27 DIAGNOSIS — J984 Other disorders of lung: Secondary | ICD-10-CM

## 2013-12-27 DIAGNOSIS — D3A00 Benign carcinoid tumor of unspecified site: Secondary | ICD-10-CM

## 2013-12-27 DIAGNOSIS — Z9889 Other specified postprocedural states: Secondary | ICD-10-CM

## 2013-12-27 DIAGNOSIS — Z902 Acquired absence of lung [part of]: Secondary | ICD-10-CM

## 2013-12-27 DIAGNOSIS — R911 Solitary pulmonary nodule: Secondary | ICD-10-CM

## 2013-12-27 DIAGNOSIS — R918 Other nonspecific abnormal finding of lung field: Secondary | ICD-10-CM

## 2013-12-27 DIAGNOSIS — J9819 Other pulmonary collapse: Secondary | ICD-10-CM | POA: Diagnosis not present

## 2013-12-27 MED ORDER — HYDROCODONE-ACETAMINOPHEN 5-325 MG PO TABS: 1.0000 | ORAL_TABLET | ORAL | Status: DC | PRN

## 2013-12-27 NOTE — Progress Notes (Signed)
[address]       [place] [address]             [phone]      Marie Chavez [facility] Medical Record #[id number] Date of Birth: Mar 22, 1956  Referring: Chesley Mires, MD Primary Care: Christ Kick, MD  Chief Complaint:   POST OP FOLLOW UP DATE OF PROCEDURE: 11/28/2013  OPERATIVE REPORT  PREOPERATIVE DIAGNOSIS: Slowly enlarging left upper lobe lung mass,  question carcinoid.  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  SURGICAL PROCEDURE: Bronchoscopy, left video-assisted thoracoscopy,  minithoracotomy, left upper lobectomy with placement of On-Q device.  SURGEON: Lanelle Bal, MD  PATH:Lung, resection (segmental or lobe), Left upper lobe - WELL DIFFERENTIATED, LOW GRADE NEUROENDOCRINE TUMOR (CARCINOID). SEE COMMENT. - NO LYMPH-VASCULAR INVASION IDENTIFIED. - BRONCHIAL MARGIN, NEGATIVE FOR TUMOR.  History of Present Illness:     Patient has had some mild shortness of breath since discharge but this seems to be improving. She's had no fever chills. No evidence of wound infection. She does note she is now taking care of her 57-year-old grandson because of drug addiction problems with her daughter. She and her husband both note that this has caused great stress on her in the postoperative period.     Past Medical History  Diagnosis Date  . Hypertension   . Panic disorder   . Herniated disc   . DJD (degenerative joint disease)   . PONV (postoperative nausea and vomiting)     Had a panic attack upon awakening from Hysterectomy   . Tachycardia     Takes Atenolol  . Bronchitis     Hx of  . Environmental allergies   . Seasonal allergies   . Pneumonia     Hx of "walking Pneumonia"   . Shortness of breath     when bending over or just sitting thinks it comes from lung mass  . Depression   . GERD (gastroesophageal reflux disease)   . Hypothyroidism   . Hypoglycemia   . Urgency of  urination   . Dry skin      History  Smoking status  . Never Smoker   Smokeless tobacco  . Not on file    History  Alcohol Use No     Allergies  Allergen Reactions  . Bee Venom Anaphylaxis and Other (See Comments)    Developed "blue rings" all over body after being stung  . Celebrex [Celecoxib]     REACTION: heart rate increases, itching  . Mobic [Meloxicam] Itching    HEART RACES  . Prednisone     REACTION: Heart rate increases, itching    Current Outpatient Prescriptions  Medication Sig Dispense Refill  . atenolol (TENORMIN) 50 MG tablet Take 50 mg by mouth daily.        . clonazePAM (KLONOPIN) 1 MG tablet Take 1 mg by mouth 2 (two) times daily as needed for anxiety.       Marland Kitchen EPIPEN 2-PAK 0.3 MG/0.3ML SOAJ injection Inject 0.3 mg as directed once. As needed      . escitalopram (LEXAPRO) 10 MG tablet Take 10 mg by mouth at bedtime.       Marland Kitchen esomeprazole (NEXIUM) 40 MG capsule Take 40 mg by mouth daily before breakfast.        . estradiol (VIVELLE-DOT) 0.05 MG/24HR Place 1 patch onto the skin once a week. On Saturday      . fluticasone (  FLONASE) 50 MCG/ACT nasal spray Place 2 sprays into both nostrils daily as needed for allergies.       Marland Kitchen gabapentin (NEURONTIN) 300 MG capsule Take 300 mg by mouth at bedtime as needed.      Marland Kitchen levothyroxine (SYNTHROID, LEVOTHROID) 150 MCG tablet Take 150 mcg by mouth daily before breakfast.      . lisinopril (PRINIVIL,ZESTRIL) 5 MG tablet Take 5 mg by mouth daily.       . mometasone (NASONEX) 50 MCG/ACT nasal spray Place 2 sprays into both nostrils daily.      . nabumetone (RELAFEN) 750 MG tablet Take 750 mg by mouth at bedtime as needed for mild pain.       . promethazine (PHENERGAN) 25 MG tablet Take 25 mg by mouth every 6 (six) hours as needed for nausea or vomiting.      . traMADol (ULTRAM) 50 MG tablet Take 1 tablet (50 mg total) by mouth every 6 (six) hours as needed for moderate pain (one or two every 6 hrs as needed for pain).  40  tablet  0  . HYDROcodone-acetaminophen (NORCO/VICODIN) 5-325 MG per tablet Take 1-2 tablets by mouth every 4 (four) hours as needed for moderate pain.  50 tablet  0   No current facility-administered medications for this visit.       Physical Exam: BP 131/72  Pulse 61  Resp 18  Ht 5\' 5"  (1.651 m)  Wt 213 lb (96.616 kg)  BMI 35.44 kg/m2  SpO2 98%  General appearance: alert, cooperative and no distress Neurologic: intact Heart: regular rate and rhythm, S1, S2 normal, no murmur, click, rub or gallop Lungs: diminished breath sounds LLL Abdomen: soft, non-tender; bowel sounds normal; no masses,  no organomegaly Extremities: extremities normal, atraumatic, no cyanosis or edema and Homans sign is negative, no sign of DVT Wound: Incision and chest tube sites and port sites are all well-healed without evidence of infection   Diagnostic Studies & Laboratory data:     Recent Radiology Findings:   Dg Chest 2 View  12/27/2013   CLINICAL DATA:  Chest pain, left VATS November 28, 2013.  EXAM: CHEST  2 VIEW  COMPARISON:  DG CHEST 2 VIEW dated 12/03/2013  FINDINGS: Left apical pleural thickening with no discrete pneumothorax on today's examination. Mild residual left pleural thickening associated with left lateral rib fractures/thoracotomy. Strandy densities in left lung base are improved, favoring atelectasis. Right lung is clear.  Cardiac silhouette is upper limits of normal, mediastinal silhouette is nonsuspicious. Surgical clips in the abdomen may reflect cholecystectomy. Osseous structures are unchanged.  IMPRESSION: Left apical pleural thickening without pneumothorax. Evolving postoperative changes of the left lung with left lung base atelectasis.   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  She was given in additional prescription for postop pain medication, she was given hydrocodone with Tylenol as the Ultram was not effective for her pain relief    Grace Isaac MD      [address] ,[place] [address] Office [phone]   Beeper [phone]  12/27/2013 4:55 PM

## 2014-01-07 DIAGNOSIS — F410 Panic disorder [episodic paroxysmal anxiety] without agoraphobia: Secondary | ICD-10-CM | POA: Diagnosis not present

## 2014-01-28 DIAGNOSIS — I10 Essential (primary) hypertension: Secondary | ICD-10-CM | POA: Diagnosis not present

## 2014-01-28 DIAGNOSIS — R5383 Other fatigue: Secondary | ICD-10-CM | POA: Diagnosis not present

## 2014-01-28 DIAGNOSIS — J019 Acute sinusitis, unspecified: Secondary | ICD-10-CM | POA: Diagnosis not present

## 2014-01-28 DIAGNOSIS — R5381 Other malaise: Secondary | ICD-10-CM | POA: Diagnosis not present

## 2014-01-28 DIAGNOSIS — J309 Allergic rhinitis, unspecified: Secondary | ICD-10-CM | POA: Diagnosis not present

## 2014-02-08 DIAGNOSIS — R002 Palpitations: Secondary | ICD-10-CM | POA: Diagnosis not present

## 2014-02-08 DIAGNOSIS — R5383 Other fatigue: Secondary | ICD-10-CM | POA: Diagnosis not present

## 2014-02-08 DIAGNOSIS — K219 Gastro-esophageal reflux disease without esophagitis: Secondary | ICD-10-CM | POA: Diagnosis not present

## 2014-02-08 DIAGNOSIS — R350 Frequency of micturition: Secondary | ICD-10-CM | POA: Diagnosis not present

## 2014-02-08 DIAGNOSIS — R5381 Other malaise: Secondary | ICD-10-CM | POA: Diagnosis not present

## 2014-02-08 DIAGNOSIS — B379 Candidiasis, unspecified: Secondary | ICD-10-CM | POA: Diagnosis not present

## 2014-02-28 DIAGNOSIS — J309 Allergic rhinitis, unspecified: Secondary | ICD-10-CM | POA: Diagnosis not present

## 2014-02-28 DIAGNOSIS — I10 Essential (primary) hypertension: Secondary | ICD-10-CM | POA: Diagnosis not present

## 2014-02-28 DIAGNOSIS — K219 Gastro-esophageal reflux disease without esophagitis: Secondary | ICD-10-CM | POA: Diagnosis not present

## 2014-02-28 DIAGNOSIS — E039 Hypothyroidism, unspecified: Secondary | ICD-10-CM | POA: Diagnosis not present

## 2014-02-28 DIAGNOSIS — T7840XA Allergy, unspecified, initial encounter: Secondary | ICD-10-CM | POA: Diagnosis not present

## 2014-02-28 DIAGNOSIS — R5381 Other malaise: Secondary | ICD-10-CM | POA: Diagnosis not present

## 2014-02-28 DIAGNOSIS — R5383 Other fatigue: Secondary | ICD-10-CM | POA: Diagnosis not present

## 2014-03-04 DIAGNOSIS — F410 Panic disorder [episodic paroxysmal anxiety] without agoraphobia: Secondary | ICD-10-CM | POA: Diagnosis not present

## 2014-03-06 DIAGNOSIS — M9981 Other biomechanical lesions of cervical region: Secondary | ICD-10-CM | POA: Diagnosis not present

## 2014-03-06 DIAGNOSIS — M5137 Other intervertebral disc degeneration, lumbosacral region: Secondary | ICD-10-CM | POA: Diagnosis not present

## 2014-03-06 DIAGNOSIS — M5030 Other cervical disc degeneration, unspecified cervical region: Secondary | ICD-10-CM | POA: Diagnosis not present

## 2014-03-06 DIAGNOSIS — M999 Biomechanical lesion, unspecified: Secondary | ICD-10-CM | POA: Diagnosis not present

## 2014-03-09 DIAGNOSIS — Z79899 Other long term (current) drug therapy: Secondary | ICD-10-CM | POA: Diagnosis not present

## 2014-03-09 DIAGNOSIS — K219 Gastro-esophageal reflux disease without esophagitis: Secondary | ICD-10-CM | POA: Diagnosis not present

## 2014-03-09 DIAGNOSIS — E039 Hypothyroidism, unspecified: Secondary | ICD-10-CM | POA: Diagnosis not present

## 2014-03-09 DIAGNOSIS — M19079 Primary osteoarthritis, unspecified ankle and foot: Secondary | ICD-10-CM | POA: Diagnosis not present

## 2014-03-09 DIAGNOSIS — M25579 Pain in unspecified ankle and joints of unspecified foot: Secondary | ICD-10-CM | POA: Diagnosis not present

## 2014-03-09 DIAGNOSIS — I10 Essential (primary) hypertension: Secondary | ICD-10-CM | POA: Diagnosis not present

## 2014-03-13 DIAGNOSIS — J309 Allergic rhinitis, unspecified: Secondary | ICD-10-CM | POA: Diagnosis not present

## 2014-03-13 DIAGNOSIS — E039 Hypothyroidism, unspecified: Secondary | ICD-10-CM | POA: Diagnosis not present

## 2014-03-13 DIAGNOSIS — M25473 Effusion, unspecified ankle: Secondary | ICD-10-CM | POA: Diagnosis not present

## 2014-03-13 DIAGNOSIS — E559 Vitamin D deficiency, unspecified: Secondary | ICD-10-CM | POA: Diagnosis not present

## 2014-03-13 DIAGNOSIS — R5383 Other fatigue: Secondary | ICD-10-CM | POA: Diagnosis not present

## 2014-03-13 DIAGNOSIS — Z91018 Allergy to other foods: Secondary | ICD-10-CM | POA: Diagnosis not present

## 2014-03-13 DIAGNOSIS — R5381 Other malaise: Secondary | ICD-10-CM | POA: Diagnosis not present

## 2014-03-13 DIAGNOSIS — M25476 Effusion, unspecified foot: Secondary | ICD-10-CM | POA: Diagnosis not present

## 2014-03-18 DIAGNOSIS — I10 Essential (primary) hypertension: Secondary | ICD-10-CM | POA: Diagnosis not present

## 2014-03-18 DIAGNOSIS — K219 Gastro-esophageal reflux disease without esophagitis: Secondary | ICD-10-CM | POA: Diagnosis not present

## 2014-03-18 DIAGNOSIS — Z79899 Other long term (current) drug therapy: Secondary | ICD-10-CM | POA: Diagnosis not present

## 2014-03-18 DIAGNOSIS — M542 Cervicalgia: Secondary | ICD-10-CM | POA: Diagnosis not present

## 2014-03-18 DIAGNOSIS — M549 Dorsalgia, unspecified: Secondary | ICD-10-CM | POA: Diagnosis not present

## 2014-03-18 DIAGNOSIS — M545 Low back pain, unspecified: Secondary | ICD-10-CM | POA: Diagnosis not present

## 2014-03-18 DIAGNOSIS — S79919A Unspecified injury of unspecified hip, initial encounter: Secondary | ICD-10-CM | POA: Diagnosis not present

## 2014-03-18 DIAGNOSIS — IMO0002 Reserved for concepts with insufficient information to code with codable children: Secondary | ICD-10-CM | POA: Diagnosis not present

## 2014-03-18 DIAGNOSIS — M25559 Pain in unspecified hip: Secondary | ICD-10-CM | POA: Diagnosis not present

## 2014-03-18 DIAGNOSIS — S199XXA Unspecified injury of neck, initial encounter: Secondary | ICD-10-CM | POA: Diagnosis not present

## 2014-03-18 DIAGNOSIS — S0993XA Unspecified injury of face, initial encounter: Secondary | ICD-10-CM | POA: Diagnosis not present

## 2014-03-18 DIAGNOSIS — W010XXA Fall on same level from slipping, tripping and stumbling without subsequent striking against object, initial encounter: Secondary | ICD-10-CM | POA: Diagnosis not present

## 2014-03-18 DIAGNOSIS — S8000XA Contusion of unspecified knee, initial encounter: Secondary | ICD-10-CM | POA: Diagnosis not present

## 2014-03-18 DIAGNOSIS — E039 Hypothyroidism, unspecified: Secondary | ICD-10-CM | POA: Diagnosis not present

## 2014-03-20 DIAGNOSIS — M9981 Other biomechanical lesions of cervical region: Secondary | ICD-10-CM | POA: Diagnosis not present

## 2014-03-20 DIAGNOSIS — M999 Biomechanical lesion, unspecified: Secondary | ICD-10-CM | POA: Diagnosis not present

## 2014-03-20 DIAGNOSIS — J309 Allergic rhinitis, unspecified: Secondary | ICD-10-CM | POA: Diagnosis not present

## 2014-03-20 DIAGNOSIS — E559 Vitamin D deficiency, unspecified: Secondary | ICD-10-CM | POA: Diagnosis not present

## 2014-03-20 DIAGNOSIS — M799 Soft tissue disorder, unspecified: Secondary | ICD-10-CM | POA: Diagnosis not present

## 2014-03-20 DIAGNOSIS — M5137 Other intervertebral disc degeneration, lumbosacral region: Secondary | ICD-10-CM | POA: Diagnosis not present

## 2014-03-20 DIAGNOSIS — M5030 Other cervical disc degeneration, unspecified cervical region: Secondary | ICD-10-CM | POA: Diagnosis not present

## 2014-03-28 DIAGNOSIS — B3731 Acute candidiasis of vulva and vagina: Secondary | ICD-10-CM | POA: Diagnosis not present

## 2014-03-28 DIAGNOSIS — B369 Superficial mycosis, unspecified: Secondary | ICD-10-CM | POA: Diagnosis not present

## 2014-03-28 DIAGNOSIS — T7840XA Allergy, unspecified, initial encounter: Secondary | ICD-10-CM | POA: Diagnosis not present

## 2014-03-28 DIAGNOSIS — B373 Candidiasis of vulva and vagina: Secondary | ICD-10-CM | POA: Diagnosis not present

## 2014-03-28 DIAGNOSIS — J309 Allergic rhinitis, unspecified: Secondary | ICD-10-CM | POA: Diagnosis not present

## 2014-04-03 ENCOUNTER — Other Ambulatory Visit: Payer: Self-pay | Admitting: Cardiothoracic Surgery

## 2014-04-03 DIAGNOSIS — C3490 Malignant neoplasm of unspecified part of unspecified bronchus or lung: Secondary | ICD-10-CM

## 2014-04-04 ENCOUNTER — Encounter: Payer: Self-pay | Admitting: Cardiothoracic Surgery

## 2014-04-04 ENCOUNTER — Ambulatory Visit (INDEPENDENT_AMBULATORY_CARE_PROVIDER_SITE_OTHER): Payer: Medicare Other | Admitting: Cardiothoracic Surgery

## 2014-04-04 ENCOUNTER — Ambulatory Visit
Admission: RE | Admit: 2014-04-04 | Discharge: 2014-04-04 | Disposition: A | Discharge status: Home / Self Care | Payer: Medicare Other | Source: Ambulatory Visit | Attending: Cardiothoracic Surgery | Admitting: Cardiothoracic Surgery

## 2014-04-04 VITALS — BP 124/69 | HR 78 | Resp 16 | Ht 65.0 in | Wt 215.0 lb

## 2014-04-04 DIAGNOSIS — J984 Other disorders of lung: Secondary | ICD-10-CM | POA: Diagnosis not present

## 2014-04-04 DIAGNOSIS — D3A00 Benign carcinoid tumor of unspecified site: Secondary | ICD-10-CM

## 2014-04-04 DIAGNOSIS — J309 Allergic rhinitis, unspecified: Secondary | ICD-10-CM | POA: Diagnosis not present

## 2014-04-04 DIAGNOSIS — Z902 Acquired absence of lung [part of]: Secondary | ICD-10-CM

## 2014-04-04 DIAGNOSIS — C3490 Malignant neoplasm of unspecified part of unspecified bronchus or lung: Secondary | ICD-10-CM | POA: Diagnosis not present

## 2014-04-04 DIAGNOSIS — Z9889 Other specified postprocedural states: Secondary | ICD-10-CM | POA: Diagnosis not present

## 2014-04-04 NOTE — Progress Notes (Signed)
[address]       [place] [address]             [phone]      Diany M Lewers [place] Medical Record #[id number] Date of Birth: [birth year]  Referring: Chesley Mires, MD Primary Care: Christ Kick, MD  Chief Complaint:   POST OP FOLLOW UP DATE OF PROCEDURE: 11/28/2013  OPERATIVE REPORT  PREOPERATIVE DIAGNOSIS: Slowly enlarging left upper lobe lung mass,  question carcinoid.  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  SURGICAL PROCEDURE: Bronchoscopy, left video-assisted thoracoscopy,  minithoracotomy, left upper lobectomy with placement of On-Q device.  SURGEON: Lanelle Bal, MD  PATH:Lung, resection (segmental or lobe), Left upper lobe - WELL DIFFERENTIATED, LOW GRADE NEUROENDOCRINE TUMOR (CARCINOID). SEE COMMENT. - NO LYMPH-VASCULAR INVASION IDENTIFIED. - BRONCHIAL MARGIN, NEGATIVE FOR TUMOR.  History of Present Illness:     Patient has  some mild shortness of breath  but this seems to be improving. She's had no fever chills.She does note she still caring forher 37-year-old grandson because of drug addiction problems with her daughter.   She came to her appointment today because she was planning on going to the beach tomorrow.  Past Medical History  Diagnosis Date  . Hypertension   . Panic disorder   . Herniated disc   . DJD (degenerative joint disease)   . PONV (postoperative nausea and vomiting)     Had a panic attack upon awakening from Hysterectomy   . Tachycardia     Takes Atenolol  . Bronchitis     Hx of  . Environmental allergies   . Seasonal allergies   . Pneumonia     Hx of "walking Pneumonia"   . Shortness of breath     when bending over or just sitting thinks it comes from lung mass  . Depression   . GERD (gastroesophageal reflux disease)   . Hypothyroidism   . Hypoglycemia   . Urgency of urination   . Dry skin      History  Smoking status  . Never Smoker    Smokeless tobacco  . Not on file    History  Alcohol Use No     Allergies  Allergen Reactions  . Bee Venom Anaphylaxis and Other (See Comments)    Developed "blue rings" all over body after being stung  . Celebrex [Celecoxib]     REACTION: heart rate increases, itching  . Mobic [Meloxicam] Itching    HEART RACES  . Prednisone     REACTION: Heart rate increases, itching    Current Outpatient Prescriptions  Medication Sig Dispense Refill  . atenolol (TENORMIN) 50 MG tablet Take 50 mg by mouth daily.        . clonazePAM (KLONOPIN) 1 MG tablet Take 1 mg by mouth 2 (two) times daily as needed for anxiety.       Marland Kitchen EPIPEN 2-PAK 0.3 MG/0.3ML SOAJ injection Inject 0.3 mg as directed once. As needed      . escitalopram (LEXAPRO) 10 MG tablet Take 10 mg by mouth at bedtime.       Marland Kitchen esomeprazole (NEXIUM) 40 MG capsule Take 40 mg by mouth daily before breakfast.        . estradiol (VIVELLE-DOT) 0.05 MG/24HR Place 1 patch onto the skin once a week. On Saturday      . fluticasone (FLONASE) 50 MCG/ACT nasal spray Place 2 sprays into both nostrils daily as  needed for allergies.       Marland Kitchen gabapentin (NEURONTIN) 300 MG capsule Take 300 mg by mouth at bedtime as needed.      Marland Kitchen HYDROcodone-acetaminophen (NORCO/VICODIN) 5-325 MG per tablet Take 1-2 tablets by mouth every 4 (four) hours as needed for moderate pain.  50 tablet  0  . levothyroxine (SYNTHROID, LEVOTHROID) 150 MCG tablet Take 150 mcg by mouth daily before breakfast.      . lisinopril (PRINIVIL,ZESTRIL) 5 MG tablet Take 5 mg by mouth daily.       . mometasone (NASONEX) 50 MCG/ACT nasal spray Place 2 sprays into both nostrils daily.      . nabumetone (RELAFEN) 750 MG tablet Take 750 mg by mouth at bedtime as needed for mild pain.       . promethazine (PHENERGAN) 25 MG tablet Take 25 mg by mouth every 6 (six) hours as needed for nausea or vomiting.       No current facility-administered medications for this visit.       Physical  Exam: BP 124/69  Pulse 78  Resp 16  Ht 5\' 5"  (1.651 m)  Wt 215 lb (97.523 kg)  BMI 35.78 kg/m2  SpO2 98%  General appearance: alert, cooperative and no distress Neurologic: intact Heart: regular rate and rhythm, S1, S2 normal, no murmur, click, rub or gallop Lungs: diminished breath sounds LLL Abdomen: soft, non-tender; bowel sounds normal; no masses,  no organomegaly Extremities: extremities normal, atraumatic, no cyanosis or edema and Homans sign is negative, no sign of DVT Wound: Incision and chest tube sites and port sites are all well-healed without evidence of infection Patient has no cervical or supraclavicular or axillary adenopathy  Diagnostic Studies & Laboratory data:     Recent Radiology Findings:   Dg Chest 2 View  04/04/2014   CLINICAL DATA:  Lung carcinoma with shortness of breath and chest pain  EXAM: CHEST  2 VIEW  COMPARISON:  December 27, 2013  FINDINGS: There is postoperative change on the left with scarring. There is no edema or consolidation. No well-defined seen. There is no appreciable adenopathy. Heart size is normal. Pulmonary vascularity is within normal limits given the postoperative change on the left. There are no appreciable bone lesions. Postoperative rib defects on the left are noted.  IMPRESSION: Scarring on the left. No edema or consolidation. No demonstrable mass or adenopathy.   Electronically Signed   By: Lowella Grip M.D.   On: 04/04/2014 13:54      Recent Lab Findings: Lab Results  Component Value Date   WBC 7.0 12/01/2013   HGB 11.1* 12/01/2013   HCT 33.4* 12/01/2013   PLT 167 12/01/2013   GLUCOSE 109* 12/01/2013   ALT 31 11/30/2013   AST 21 11/30/2013   NA 140 12/01/2013   K 4.1 12/01/2013   CL 99 12/01/2013   CREATININE 0.61 12/01/2013   BUN 7 12/01/2013   CO2 31 12/01/2013   INR 0.98 11/27/2013      Assessment / Plan:    Patient making adequate progress following resection of left lower lobe for carcinoid, typical Plan to see her back in 6 months with  a followup CT scan of the chest . She was given in additional prescription for postop pain medication, she was given hydrocodone with Tylenol as the Ultram was not effective for her pain relief    Grace Isaac MD      [address] [place] [address] Office [phone]   Beeper [phone]  04/04/2014 2:22 PM

## 2014-04-12 DIAGNOSIS — J309 Allergic rhinitis, unspecified: Secondary | ICD-10-CM | POA: Diagnosis not present

## 2014-04-15 DIAGNOSIS — F410 Panic disorder [episodic paroxysmal anxiety] without agoraphobia: Secondary | ICD-10-CM | POA: Diagnosis not present

## 2014-04-22 DIAGNOSIS — Z6835 Body mass index (BMI) 35.0-35.9, adult: Secondary | ICD-10-CM | POA: Diagnosis not present

## 2014-04-22 DIAGNOSIS — Z713 Dietary counseling and surveillance: Secondary | ICD-10-CM | POA: Diagnosis not present

## 2014-04-22 DIAGNOSIS — E663 Overweight: Secondary | ICD-10-CM | POA: Diagnosis not present

## 2014-04-22 DIAGNOSIS — H40009 Preglaucoma, unspecified, unspecified eye: Secondary | ICD-10-CM | POA: Diagnosis not present

## 2014-04-22 DIAGNOSIS — R51 Headache: Secondary | ICD-10-CM | POA: Diagnosis not present

## 2014-04-22 DIAGNOSIS — M25569 Pain in unspecified knee: Secondary | ICD-10-CM | POA: Diagnosis not present

## 2014-04-22 DIAGNOSIS — Z0000 Encounter for general adult medical examination without abnormal findings: Secondary | ICD-10-CM | POA: Diagnosis not present

## 2014-04-22 DIAGNOSIS — J309 Allergic rhinitis, unspecified: Secondary | ICD-10-CM | POA: Diagnosis not present

## 2014-04-22 DIAGNOSIS — Z1331 Encounter for screening for depression: Secondary | ICD-10-CM | POA: Diagnosis not present

## 2014-04-22 DIAGNOSIS — T7840XA Allergy, unspecified, initial encounter: Secondary | ICD-10-CM | POA: Diagnosis not present

## 2014-04-30 DIAGNOSIS — H9209 Otalgia, unspecified ear: Secondary | ICD-10-CM | POA: Diagnosis not present

## 2014-04-30 DIAGNOSIS — B850 Pediculosis due to Pediculus humanus capitis: Secondary | ICD-10-CM | POA: Diagnosis not present

## 2014-04-30 DIAGNOSIS — J309 Allergic rhinitis, unspecified: Secondary | ICD-10-CM | POA: Diagnosis not present

## 2014-04-30 DIAGNOSIS — L299 Pruritus, unspecified: Secondary | ICD-10-CM | POA: Diagnosis not present

## 2014-04-30 DIAGNOSIS — T7840XA Allergy, unspecified, initial encounter: Secondary | ICD-10-CM | POA: Diagnosis not present

## 2014-05-01 DIAGNOSIS — M999 Biomechanical lesion, unspecified: Secondary | ICD-10-CM | POA: Diagnosis not present

## 2014-05-01 DIAGNOSIS — M9981 Other biomechanical lesions of cervical region: Secondary | ICD-10-CM | POA: Diagnosis not present

## 2014-05-01 DIAGNOSIS — M5137 Other intervertebral disc degeneration, lumbosacral region: Secondary | ICD-10-CM | POA: Diagnosis not present

## 2014-05-01 DIAGNOSIS — M5030 Other cervical disc degeneration, unspecified cervical region: Secondary | ICD-10-CM | POA: Diagnosis not present

## 2014-05-03 DIAGNOSIS — Z1231 Encounter for screening mammogram for malignant neoplasm of breast: Secondary | ICD-10-CM | POA: Diagnosis not present

## 2014-05-10 DIAGNOSIS — L299 Pruritus, unspecified: Secondary | ICD-10-CM | POA: Diagnosis not present

## 2014-05-10 DIAGNOSIS — R51 Headache: Secondary | ICD-10-CM | POA: Diagnosis not present

## 2014-05-10 DIAGNOSIS — B850 Pediculosis due to Pediculus humanus capitis: Secondary | ICD-10-CM | POA: Diagnosis not present

## 2014-05-10 DIAGNOSIS — J309 Allergic rhinitis, unspecified: Secondary | ICD-10-CM | POA: Diagnosis not present

## 2014-05-10 DIAGNOSIS — T7840XA Allergy, unspecified, initial encounter: Secondary | ICD-10-CM | POA: Diagnosis not present

## 2014-05-10 DIAGNOSIS — H9209 Otalgia, unspecified ear: Secondary | ICD-10-CM | POA: Diagnosis not present

## 2014-05-15 DIAGNOSIS — M6740 Ganglion, unspecified site: Secondary | ICD-10-CM | POA: Diagnosis not present

## 2014-05-15 DIAGNOSIS — M25569 Pain in unspecified knee: Secondary | ICD-10-CM | POA: Diagnosis not present

## 2014-05-15 DIAGNOSIS — M7120 Synovial cyst of popliteal space [Baker], unspecified knee: Secondary | ICD-10-CM | POA: Diagnosis not present

## 2014-05-17 DIAGNOSIS — M25569 Pain in unspecified knee: Secondary | ICD-10-CM | POA: Diagnosis not present

## 2014-05-17 DIAGNOSIS — J309 Allergic rhinitis, unspecified: Secondary | ICD-10-CM | POA: Diagnosis not present

## 2014-05-17 DIAGNOSIS — T7840XA Allergy, unspecified, initial encounter: Secondary | ICD-10-CM | POA: Diagnosis not present

## 2014-05-17 DIAGNOSIS — R51 Headache: Secondary | ICD-10-CM | POA: Diagnosis not present

## 2014-05-27 DIAGNOSIS — K219 Gastro-esophageal reflux disease without esophagitis: Secondary | ICD-10-CM | POA: Diagnosis not present

## 2014-05-27 DIAGNOSIS — R05 Cough: Secondary | ICD-10-CM | POA: Diagnosis not present

## 2014-05-27 DIAGNOSIS — L299 Pruritus, unspecified: Secondary | ICD-10-CM | POA: Diagnosis not present

## 2014-05-27 DIAGNOSIS — R059 Cough, unspecified: Secondary | ICD-10-CM | POA: Diagnosis not present

## 2014-05-27 DIAGNOSIS — B373 Candidiasis of vulva and vagina: Secondary | ICD-10-CM | POA: Diagnosis not present

## 2014-05-27 DIAGNOSIS — B3731 Acute candidiasis of vulva and vagina: Secondary | ICD-10-CM | POA: Diagnosis not present

## 2014-06-05 DIAGNOSIS — T7840XA Allergy, unspecified, initial encounter: Secondary | ICD-10-CM | POA: Diagnosis not present

## 2014-06-05 DIAGNOSIS — J309 Allergic rhinitis, unspecified: Secondary | ICD-10-CM | POA: Diagnosis not present

## 2014-06-10 DIAGNOSIS — F410 Panic disorder [episodic paroxysmal anxiety] without agoraphobia: Secondary | ICD-10-CM | POA: Diagnosis not present

## 2014-06-14 DIAGNOSIS — X58XXXA Exposure to other specified factors, initial encounter: Secondary | ICD-10-CM | POA: Diagnosis not present

## 2014-06-14 DIAGNOSIS — Z79899 Other long term (current) drug therapy: Secondary | ICD-10-CM | POA: Diagnosis not present

## 2014-06-14 DIAGNOSIS — E039 Hypothyroidism, unspecified: Secondary | ICD-10-CM | POA: Diagnosis not present

## 2014-06-14 DIAGNOSIS — IMO0002 Reserved for concepts with insufficient information to code with codable children: Secondary | ICD-10-CM | POA: Diagnosis not present

## 2014-06-14 DIAGNOSIS — I10 Essential (primary) hypertension: Secondary | ICD-10-CM | POA: Diagnosis not present

## 2014-06-14 DIAGNOSIS — K219 Gastro-esophageal reflux disease without esophagitis: Secondary | ICD-10-CM | POA: Diagnosis not present

## 2014-06-19 DIAGNOSIS — L299 Pruritus, unspecified: Secondary | ICD-10-CM | POA: Diagnosis not present

## 2014-06-19 DIAGNOSIS — B373 Candidiasis of vulva and vagina: Secondary | ICD-10-CM | POA: Diagnosis not present

## 2014-06-19 DIAGNOSIS — J309 Allergic rhinitis, unspecified: Secondary | ICD-10-CM | POA: Diagnosis not present

## 2014-06-19 DIAGNOSIS — R059 Cough, unspecified: Secondary | ICD-10-CM | POA: Diagnosis not present

## 2014-06-19 DIAGNOSIS — K219 Gastro-esophageal reflux disease without esophagitis: Secondary | ICD-10-CM | POA: Diagnosis not present

## 2014-06-19 DIAGNOSIS — B3731 Acute candidiasis of vulva and vagina: Secondary | ICD-10-CM | POA: Diagnosis not present

## 2014-06-19 DIAGNOSIS — R05 Cough: Secondary | ICD-10-CM | POA: Diagnosis not present

## 2014-06-19 DIAGNOSIS — Z91018 Allergy to other foods: Secondary | ICD-10-CM | POA: Diagnosis not present

## 2014-06-25 DIAGNOSIS — M1990 Unspecified osteoarthritis, unspecified site: Secondary | ICD-10-CM | POA: Diagnosis not present

## 2014-07-04 DIAGNOSIS — IMO0002 Reserved for concepts with insufficient information to code with codable children: Secondary | ICD-10-CM | POA: Diagnosis not present

## 2014-07-04 DIAGNOSIS — I10 Essential (primary) hypertension: Secondary | ICD-10-CM | POA: Diagnosis not present

## 2014-07-04 DIAGNOSIS — F411 Generalized anxiety disorder: Secondary | ICD-10-CM | POA: Diagnosis not present

## 2014-07-04 DIAGNOSIS — E079 Disorder of thyroid, unspecified: Secondary | ICD-10-CM | POA: Diagnosis not present

## 2014-07-09 DIAGNOSIS — I10 Essential (primary) hypertension: Secondary | ICD-10-CM | POA: Diagnosis not present

## 2014-07-09 DIAGNOSIS — H52229 Regular astigmatism, unspecified eye: Secondary | ICD-10-CM | POA: Diagnosis not present

## 2014-07-09 DIAGNOSIS — H524 Presbyopia: Secondary | ICD-10-CM | POA: Diagnosis not present

## 2014-07-09 DIAGNOSIS — H5200 Hypermetropia, unspecified eye: Secondary | ICD-10-CM | POA: Diagnosis not present

## 2014-07-09 DIAGNOSIS — H43319 Vitreous membranes and strands, unspecified eye: Secondary | ICD-10-CM | POA: Diagnosis not present

## 2014-07-09 DIAGNOSIS — H43819 Vitreous degeneration, unspecified eye: Secondary | ICD-10-CM | POA: Diagnosis not present

## 2014-07-25 DIAGNOSIS — G4733 Obstructive sleep apnea (adult) (pediatric): Secondary | ICD-10-CM | POA: Diagnosis not present

## 2014-07-29 DIAGNOSIS — F410 Panic disorder [episodic paroxysmal anxiety] without agoraphobia: Secondary | ICD-10-CM | POA: Diagnosis not present

## 2014-08-05 DIAGNOSIS — E079 Disorder of thyroid, unspecified: Secondary | ICD-10-CM | POA: Diagnosis not present

## 2014-08-05 DIAGNOSIS — I10 Essential (primary) hypertension: Secondary | ICD-10-CM | POA: Diagnosis not present

## 2014-08-05 DIAGNOSIS — Z79899 Other long term (current) drug therapy: Secondary | ICD-10-CM | POA: Diagnosis not present

## 2014-08-05 DIAGNOSIS — Z23 Encounter for immunization: Secondary | ICD-10-CM | POA: Diagnosis not present

## 2014-08-05 DIAGNOSIS — M1990 Unspecified osteoarthritis, unspecified site: Secondary | ICD-10-CM | POA: Diagnosis not present

## 2014-08-14 DIAGNOSIS — R05 Cough: Secondary | ICD-10-CM | POA: Diagnosis not present

## 2014-08-14 DIAGNOSIS — Z6834 Body mass index (BMI) 34.0-34.9, adult: Secondary | ICD-10-CM | POA: Diagnosis not present

## 2014-08-14 DIAGNOSIS — J069 Acute upper respiratory infection, unspecified: Secondary | ICD-10-CM | POA: Diagnosis not present

## 2014-08-27 ENCOUNTER — Other Ambulatory Visit: Payer: Self-pay

## 2014-08-27 DIAGNOSIS — C3490 Malignant neoplasm of unspecified part of unspecified bronchus or lung: Secondary | ICD-10-CM

## 2014-08-27 DIAGNOSIS — C3432 Malignant neoplasm of lower lobe, left bronchus or lung: Secondary | ICD-10-CM

## 2014-09-06 ENCOUNTER — Telehealth: Payer: Self-pay | Admitting: *Deleted

## 2014-09-06 NOTE — Telephone Encounter (Signed)
Marie Chavez husband called stating she has had trouble breathing recently. She is using her CPAP.  He thinks it may be allergies. She is s/p LULobectomy for carcinoid tumor on 11/28/13.  She is scheduled to be seen 10/10/14 with a CT CHEST in our office.  I suggested she see her PCP until then and her husband agreed.

## 2014-09-24 DIAGNOSIS — N318 Other neuromuscular dysfunction of bladder: Secondary | ICD-10-CM | POA: Diagnosis not present

## 2014-09-24 DIAGNOSIS — R351 Nocturia: Secondary | ICD-10-CM | POA: Diagnosis not present

## 2014-09-24 DIAGNOSIS — N3090 Cystitis, unspecified without hematuria: Secondary | ICD-10-CM | POA: Diagnosis not present

## 2014-09-24 DIAGNOSIS — R3915 Urgency of urination: Secondary | ICD-10-CM | POA: Diagnosis not present

## 2014-09-24 DIAGNOSIS — R109 Unspecified abdominal pain: Secondary | ICD-10-CM | POA: Diagnosis not present

## 2014-09-27 DIAGNOSIS — F410 Panic disorder [episodic paroxysmal anxiety] without agoraphobia: Secondary | ICD-10-CM | POA: Diagnosis not present

## 2014-10-01 DIAGNOSIS — R3915 Urgency of urination: Secondary | ICD-10-CM | POA: Diagnosis not present

## 2014-10-01 DIAGNOSIS — N3090 Cystitis, unspecified without hematuria: Secondary | ICD-10-CM | POA: Diagnosis not present

## 2014-10-01 DIAGNOSIS — R351 Nocturia: Secondary | ICD-10-CM | POA: Diagnosis not present

## 2014-10-01 DIAGNOSIS — R109 Unspecified abdominal pain: Secondary | ICD-10-CM | POA: Diagnosis not present

## 2014-10-07 DIAGNOSIS — J189 Pneumonia, unspecified organism: Secondary | ICD-10-CM | POA: Diagnosis not present

## 2014-10-07 DIAGNOSIS — R918 Other nonspecific abnormal finding of lung field: Secondary | ICD-10-CM | POA: Diagnosis not present

## 2014-10-07 DIAGNOSIS — R0602 Shortness of breath: Secondary | ICD-10-CM | POA: Diagnosis not present

## 2014-10-07 DIAGNOSIS — R05 Cough: Secondary | ICD-10-CM | POA: Diagnosis not present

## 2014-10-07 DIAGNOSIS — I10 Essential (primary) hypertension: Secondary | ICD-10-CM | POA: Diagnosis not present

## 2014-10-10 ENCOUNTER — Other Ambulatory Visit: Payer: Medicare Other

## 2014-10-10 ENCOUNTER — Encounter: Payer: Medicare Other | Admitting: Cardiothoracic Surgery

## 2014-10-10 NOTE — Progress Notes (Signed)
This encounter was created in error - please disregard.

## 2014-10-10 NOTE — Progress Notes (Deleted)
Marie Chavez       [place] [address]             [phone]      Marie Chavez [place] Medical Record #[id number] Date of Birth: 12/19/1955  Referring: Chesley Mires, MD Primary Care: Christ Kick, MD  Chief Complaint:   POST OP FOLLOW UP DATE OF PROCEDURE: 11/28/2013  OPERATIVE REPORT  PREOPERATIVE DIAGNOSIS: Slowly enlarging left upper lobe lung mass,  question carcinoid.  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  SURGICAL PROCEDURE: Bronchoscopy, left video-assisted thoracoscopy,  minithoracotomy, left upper lobectomy with placement of On-Q device.  SURGEON: Lanelle Bal, MD  PATH:Lung, resection (segmental or lobe), Left upper lobe - WELL DIFFERENTIATED, LOW GRADE NEUROENDOCRINE TUMOR (CARCINOID). SEE COMMENT. - NO LYMPH-VASCULAR INVASION IDENTIFIED. - BRONCHIAL MARGIN, NEGATIVE FOR TUMOR.  History of Present Illness:     Patient has  some mild shortness of breath  but this seems to be improving. She's had no fever chills.She does note she still caring forher 72-year-old grandson because of drug addiction problems with her daughter.   She came to her appointment today because she was planning on going to the beach tomorrow.  Past Medical History  Diagnosis Date  . Hypertension   . Panic disorder   . Herniated disc   . DJD (degenerative joint disease)   . PONV (postoperative nausea and vomiting)     Had a panic attack upon awakening from Hysterectomy   . Tachycardia     Takes Atenolol  . Bronchitis     Hx of  . Environmental allergies   . Seasonal allergies   . Pneumonia     Hx of "walking Pneumonia"   . Shortness of breath     when bending over or just sitting thinks it comes from lung mass  . Depression   . GERD (gastroesophageal reflux disease)   . Hypothyroidism   . Hypoglycemia   . Urgency of urination   . Dry skin      History  Smoking status  . Never Smoker    Smokeless tobacco  . Not on file    History  Alcohol Use No     Allergies  Allergen Reactions  . Bee Venom Anaphylaxis and Other (See Comments)    Developed "blue rings" all over body after being stung  . Celebrex [Celecoxib]     REACTION: heart rate increases, itching  . Mobic [Meloxicam] Itching    HEART RACES  . Prednisone     REACTION: Heart rate increases, itching    Current Outpatient Prescriptions  Medication Sig Dispense Refill  . atenolol (TENORMIN) 50 MG tablet Take 50 mg by mouth daily.      . clonazePAM (KLONOPIN) 1 MG tablet Take 1 mg by mouth 2 (two) times daily as needed for anxiety.     Marland Kitchen EPIPEN 2-PAK 0.3 MG/0.3ML SOAJ injection Inject 0.3 mg as directed once. As needed    . escitalopram (LEXAPRO) 10 MG tablet Take 10 mg by mouth at bedtime.     Marland Kitchen esomeprazole (NEXIUM) 40 MG capsule Take 40 mg by mouth daily before breakfast.      . estradiol (VIVELLE-DOT) 0.05 MG/24HR Place 1 patch onto the skin once a week. On Saturday    . fluticasone (FLONASE) 50 MCG/ACT nasal spray Place 2 sprays into both nostrils daily as needed for allergies.     Marland Kitchen gabapentin (NEURONTIN) 300 MG  capsule Take 300 mg by mouth at bedtime as needed.    Marland Kitchen HYDROcodone-acetaminophen (NORCO/VICODIN) 5-325 MG per tablet Take 1-2 tablets by mouth every 4 (four) hours as needed for moderate pain. 50 tablet 0  . levothyroxine (SYNTHROID, LEVOTHROID) 150 MCG tablet Take 150 mcg by mouth daily before breakfast.    . lisinopril (PRINIVIL,ZESTRIL) 5 MG tablet Take 5 mg by mouth daily.     . mometasone (NASONEX) 50 MCG/ACT nasal spray Place 2 sprays into both nostrils daily.    . nabumetone (RELAFEN) 750 MG tablet Take 750 mg by mouth at bedtime as needed for mild pain.     . promethazine (PHENERGAN) 25 MG tablet Take 25 mg by mouth every 6 (six) hours as needed for nausea or vomiting.     No current facility-administered medications for this visit.       Physical Exam: There were no vitals taken  for this visit.  General appearance: alert, cooperative and no distress Neurologic: intact Heart: regular rate and rhythm, S1, S2 normal, no murmur, click, rub or gallop Lungs: diminished breath sounds LLL Abdomen: soft, non-tender; bowel sounds normal; no masses,  no organomegaly Extremities: extremities normal, atraumatic, no cyanosis or edema and Homans sign is negative, no sign of DVT Wound: Incision and chest tube sites and port sites are all well-healed without evidence of infection Patient has no cervical or supraclavicular or axillary adenopathy  Diagnostic Studies & Laboratory data:     Recent Radiology Findings:   No results found.    Recent Lab Findings: Lab Results  Component Value Date   WBC 7.0 12/01/2013   HGB 11.1* 12/01/2013   HCT 33.4* 12/01/2013   PLT 167 12/01/2013   GLUCOSE 109* 12/01/2013   ALT 31 11/30/2013   AST 21 11/30/2013   NA 140 12/01/2013   K 4.1 12/01/2013   CL 99 12/01/2013   CREATININE 0.61 12/01/2013   BUN 7 12/01/2013   CO2 31 12/01/2013   INR 0.98 11/27/2013      Assessment / Plan:    Patient making adequate progress following resection of left lower lobe for carcinoid, typical Plan to see her back in 6 months with a followup CT scan of the chest .     Marie Isaac MD      [address] [place] [address] Office [phone]   Beeper [phone]  10/10/2014 3:09 PM

## 2014-10-11 ENCOUNTER — Telehealth: Payer: Self-pay | Admitting: *Deleted

## 2014-10-24 ENCOUNTER — Encounter: Payer: Self-pay | Admitting: Cardiothoracic Surgery

## 2014-10-24 ENCOUNTER — Ambulatory Visit
Admission: RE | Admit: 2014-10-24 | Discharge: 2014-10-24 | Disposition: A | Discharge status: Home / Self Care | Payer: Medicare Other | Source: Ambulatory Visit | Attending: Cardiothoracic Surgery | Admitting: Cardiothoracic Surgery

## 2014-10-24 ENCOUNTER — Ambulatory Visit (INDEPENDENT_AMBULATORY_CARE_PROVIDER_SITE_OTHER): Payer: Medicare Other | Admitting: Cardiothoracic Surgery

## 2014-10-24 VITALS — BP 130/72 | HR 70 | Resp 20 | Ht 65.0 in | Wt 222.0 lb

## 2014-10-24 DIAGNOSIS — R05 Cough: Secondary | ICD-10-CM | POA: Diagnosis not present

## 2014-10-24 DIAGNOSIS — Z9889 Other specified postprocedural states: Secondary | ICD-10-CM | POA: Diagnosis not present

## 2014-10-24 DIAGNOSIS — D3A00 Benign carcinoid tumor of unspecified site: Secondary | ICD-10-CM

## 2014-10-24 DIAGNOSIS — J189 Pneumonia, unspecified organism: Secondary | ICD-10-CM | POA: Diagnosis not present

## 2014-10-24 DIAGNOSIS — C3412 Malignant neoplasm of upper lobe, left bronchus or lung: Secondary | ICD-10-CM

## 2014-10-24 DIAGNOSIS — C3432 Malignant neoplasm of lower lobe, left bronchus or lung: Secondary | ICD-10-CM

## 2014-10-24 DIAGNOSIS — Z85118 Personal history of other malignant neoplasm of bronchus and lung: Secondary | ICD-10-CM | POA: Diagnosis not present

## 2014-10-24 DIAGNOSIS — R0602 Shortness of breath: Secondary | ICD-10-CM | POA: Diagnosis not present

## 2014-10-24 DIAGNOSIS — Z902 Acquired absence of lung [part of]: Secondary | ICD-10-CM

## 2014-10-24 NOTE — Progress Notes (Signed)
[address]       [place] [address]             [id number]      Marie Chavez [facility] Medical Record #[id number] Date of Birth: 1956/10/02  Referring: Chesley Mires, MD Primary Care: Christ Kick, MD  Chief Complaint:   POST OP FOLLOW UP DATE OF PROCEDURE: 11/28/2013  OPERATIVE REPORT  PREOPERATIVE DIAGNOSIS: Slowly enlarging left upper lobe lung mass,  question carcinoid.  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  SURGICAL PROCEDURE: Bronchoscopy, left video-assisted thoracoscopy,  minithoracotomy, left upper lobectomy with placement of On-Q device.  SURGEON: Lanelle Bal, MD  PATH:Lung, resection (segmental or lobe), Left upper lobe - WELL DIFFERENTIATED, LOW GRADE NEUROENDOCRINE TUMOR (CARCINOID). SEE COMMENT. - NO LYMPH-VASCULAR INVASION IDENTIFIED. - BRONCHIAL MARGIN, NEGATIVE FOR TUMOR.  History of Present Illness:     Patient has  some mild shortness of breath .  She notes she's had mild swelling in her hands and feet recently . Was very surprised in the office today when she was weighed , noted that she never weighed 220 pounds before . She's had no fever chills.She does note she still caring forher 36-year-old grandson because of drug addiction problems with her daughter.   She denies any hemoptysis. She was seen in [place] in early December and was treated for "pneumonia". She denied any fever chills or cough at that time   Past Medical History  Diagnosis Date  . Hypertension   . Panic disorder   . Herniated disc   . DJD (degenerative joint disease)   . PONV (postoperative nausea and vomiting)     Had a panic attack upon awakening from Hysterectomy   . Tachycardia     Takes Atenolol  . Bronchitis     Hx of  . Environmental allergies   . Seasonal allergies   . Pneumonia     Hx of "walking Pneumonia"   . Shortness of breath     when bending over or just sitting thinks  it comes from lung mass  . Depression   . GERD (gastroesophageal reflux disease)   . Hypothyroidism   . Hypoglycemia   . Urgency of urination   . Dry skin      History  Smoking status  . Never Smoker   Smokeless tobacco  . Not on file    History  Alcohol Use No     Allergies  Allergen Reactions  . Bee Venom Anaphylaxis and Other (See Comments)    Developed "blue rings" all over body after being stung  . Celebrex [Celecoxib]     REACTION: heart rate increases, itching  . Mobic [Meloxicam] Itching    HEART RACES  . Prednisone     REACTION: Heart rate increases, itching    Current Outpatient Prescriptions  Medication Sig Dispense Refill  . atenolol (TENORMIN) 50 MG tablet Take 50 mg by mouth daily.      . clonazePAM (KLONOPIN) 1 MG tablet Take 1 mg by mouth 2 (two) times daily as needed for anxiety.     Marland Kitchen EPIPEN 2-PAK 0.3 MG/0.3ML SOAJ injection Inject 0.3 mg as directed once. As needed    . escitalopram (LEXAPRO) 10 MG tablet Take 10 mg by mouth at bedtime.     Marland Kitchen esomeprazole (NEXIUM) 40 MG capsule Take 40 mg by mouth daily before breakfast.      . estradiol (VIVELLE-DOT) 0.05 MG/24HR  Place 1 patch onto the skin once a week. On Saturday    . fluticasone (FLONASE) 50 MCG/ACT nasal spray Place 2 sprays into both nostrils daily as needed for allergies.     Marland Kitchen gabapentin (NEURONTIN) 300 MG capsule Take 300 mg by mouth at bedtime as needed.    Marland Kitchen levothyroxine (SYNTHROID, LEVOTHROID) 175 MCG tablet     . lisinopril (PRINIVIL,ZESTRIL) 5 MG tablet Take 5 mg by mouth daily.     . mometasone (NASONEX) 50 MCG/ACT nasal spray Place 2 sprays into both nostrils daily.    . nabumetone (RELAFEN) 750 MG tablet Take 750 mg by mouth at bedtime as needed for mild pain.     Marland Kitchen PROAIR HFA 108 (90 BASE) MCG/ACT inhaler   0  . promethazine (PHENERGAN) 25 MG tablet Take 25 mg by mouth every 6 (six) hours as needed for nausea or vomiting.     No current facility-administered medications for this  visit.       Physical Exam: BP 130/72 mmHg  Pulse 70  Resp 20  Ht 5\' 5"  (1.651 m)  Wt 222 lb (100.699 kg)  BMI 36.94 kg/m2  SpO2 96%  General appearance: alert, cooperative and no distress Neurologic: intact Heart: regular rate and rhythm, S1, S2 normal, no murmur, click, rub or gallop Lungs: diminished breath sounds LLL Abdomen: soft, non-tender; bowel sounds normal; no masses,  no organomegaly Extremities: extremities normal, atraumatic, no cyanosis or edema and Homans sign is negative, no sign of DVT Wound: Incision and chest tube sites and port sites are all well-healed without evidence of infection Patient has no cervical or supraclavicular or axillary adenopathy  Diagnostic Studies & Laboratory data:     Recent Radiology Findings:   Ct Chest Wo Contrast  10/24/2014   CLINICAL DATA:  Followup left lung carcinoma. Cough. Shortness of breath. Recent pneumonia.  EXAM: CT CHEST WITHOUT CONTRAST  TECHNIQUE: Multidetector CT imaging of the chest was performed following the standard protocol without IV contrast.  COMPARISON:  10/29/2013  FINDINGS: Mediastinum/Hilar Regions: No masses or pathologically enlarged lymph nodes identified.  Other Thoracic Lymphadenopathy:  None.  Lungs: Postop changes from left upper lobectomy noted. Scarring noted in the anterior left lung base. No suspicious pulmonary nodules or masses are identified. No evidence of pulmonary infiltrate or central endobronchial obstruction.  Pleura:  No evidence of effusion or mass.  Vascular/Cardiac:  No acute findings identified.  Other:  None.  Musculoskeletal:  No suspicious bone lesions identified.  IMPRESSION: Postop changes in left hemithorax. No evidence of recurrent or metastatic carcinoma within the thorax. No other acute findings.   Electronically Signed   By: Earle Gell M.D.   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   Grace Isaac MD  [address] [place] [address] Office [phone]   Beeper [phone]  10/24/2014 12:47 PM

## 2014-11-05 DIAGNOSIS — Z79899 Other long term (current) drug therapy: Secondary | ICD-10-CM | POA: Diagnosis not present

## 2014-11-05 DIAGNOSIS — Z6835 Body mass index (BMI) 35.0-35.9, adult: Secondary | ICD-10-CM | POA: Diagnosis not present

## 2014-11-05 DIAGNOSIS — E079 Disorder of thyroid, unspecified: Secondary | ICD-10-CM | POA: Diagnosis not present

## 2014-11-05 DIAGNOSIS — Z23 Encounter for immunization: Secondary | ICD-10-CM | POA: Diagnosis not present

## 2014-11-05 DIAGNOSIS — G4700 Insomnia, unspecified: Secondary | ICD-10-CM | POA: Diagnosis not present

## 2014-11-05 DIAGNOSIS — E782 Mixed hyperlipidemia: Secondary | ICD-10-CM | POA: Diagnosis not present

## 2014-11-05 DIAGNOSIS — G4733 Obstructive sleep apnea (adult) (pediatric): Secondary | ICD-10-CM | POA: Diagnosis not present

## 2014-12-11 DIAGNOSIS — M9904 Segmental and somatic dysfunction of sacral region: Secondary | ICD-10-CM | POA: Diagnosis not present

## 2014-12-11 DIAGNOSIS — M5136 Other intervertebral disc degeneration, lumbar region: Secondary | ICD-10-CM | POA: Diagnosis not present

## 2014-12-11 DIAGNOSIS — M9902 Segmental and somatic dysfunction of thoracic region: Secondary | ICD-10-CM | POA: Diagnosis not present

## 2014-12-11 DIAGNOSIS — M9903 Segmental and somatic dysfunction of lumbar region: Secondary | ICD-10-CM | POA: Diagnosis not present

## 2014-12-11 DIAGNOSIS — M5442 Lumbago with sciatica, left side: Secondary | ICD-10-CM | POA: Diagnosis not present

## 2014-12-16 DIAGNOSIS — F410 Panic disorder [episodic paroxysmal anxiety] without agoraphobia: Secondary | ICD-10-CM | POA: Diagnosis not present

## 2015-01-02 IMAGING — CR DG CHEST 2V
2 series · 2 of 2 positions shown · non-contrast
Comparison: 12/02/2013

CLINICAL DATA: Lung cancer post VATS

EXAM:
CHEST  2 VIEW

[w chest pa]
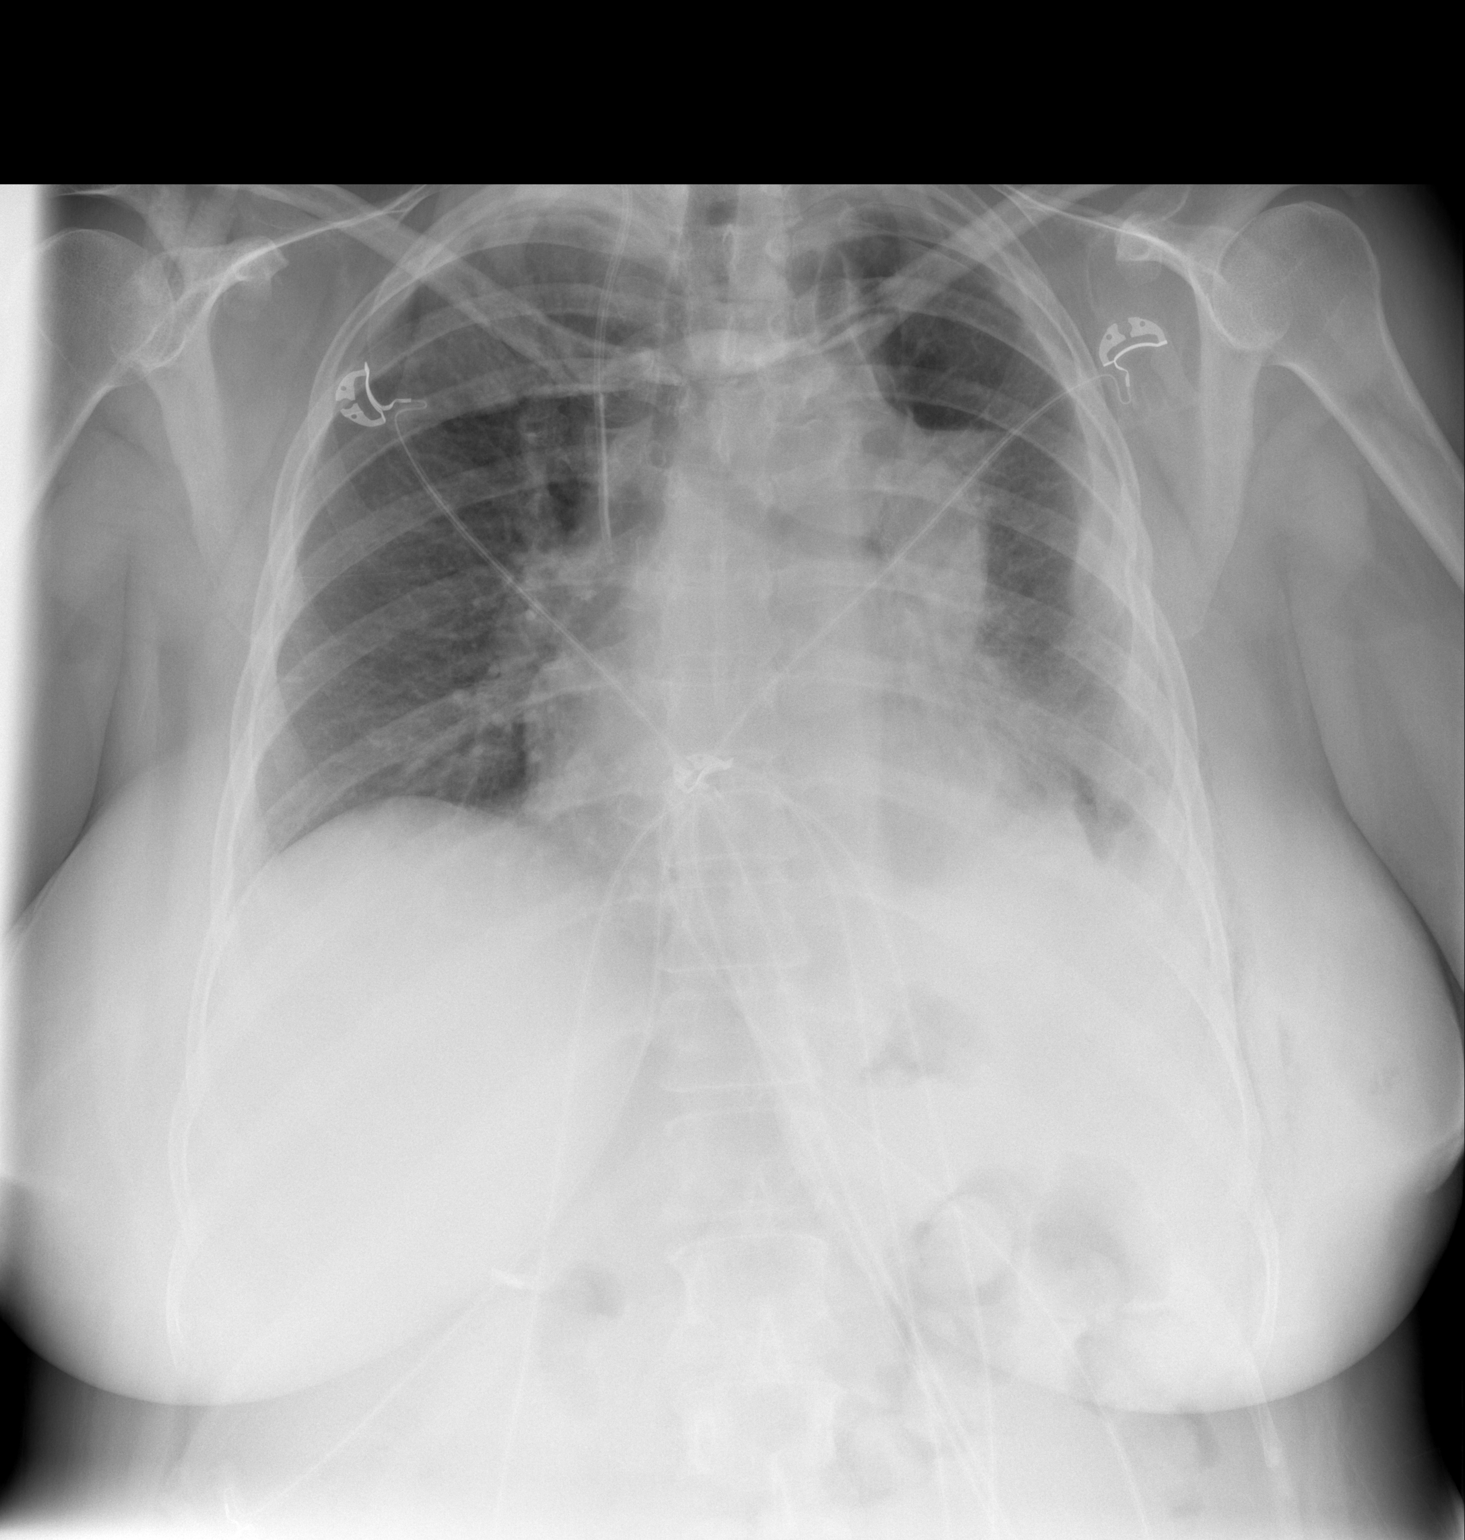

[w chest lat]
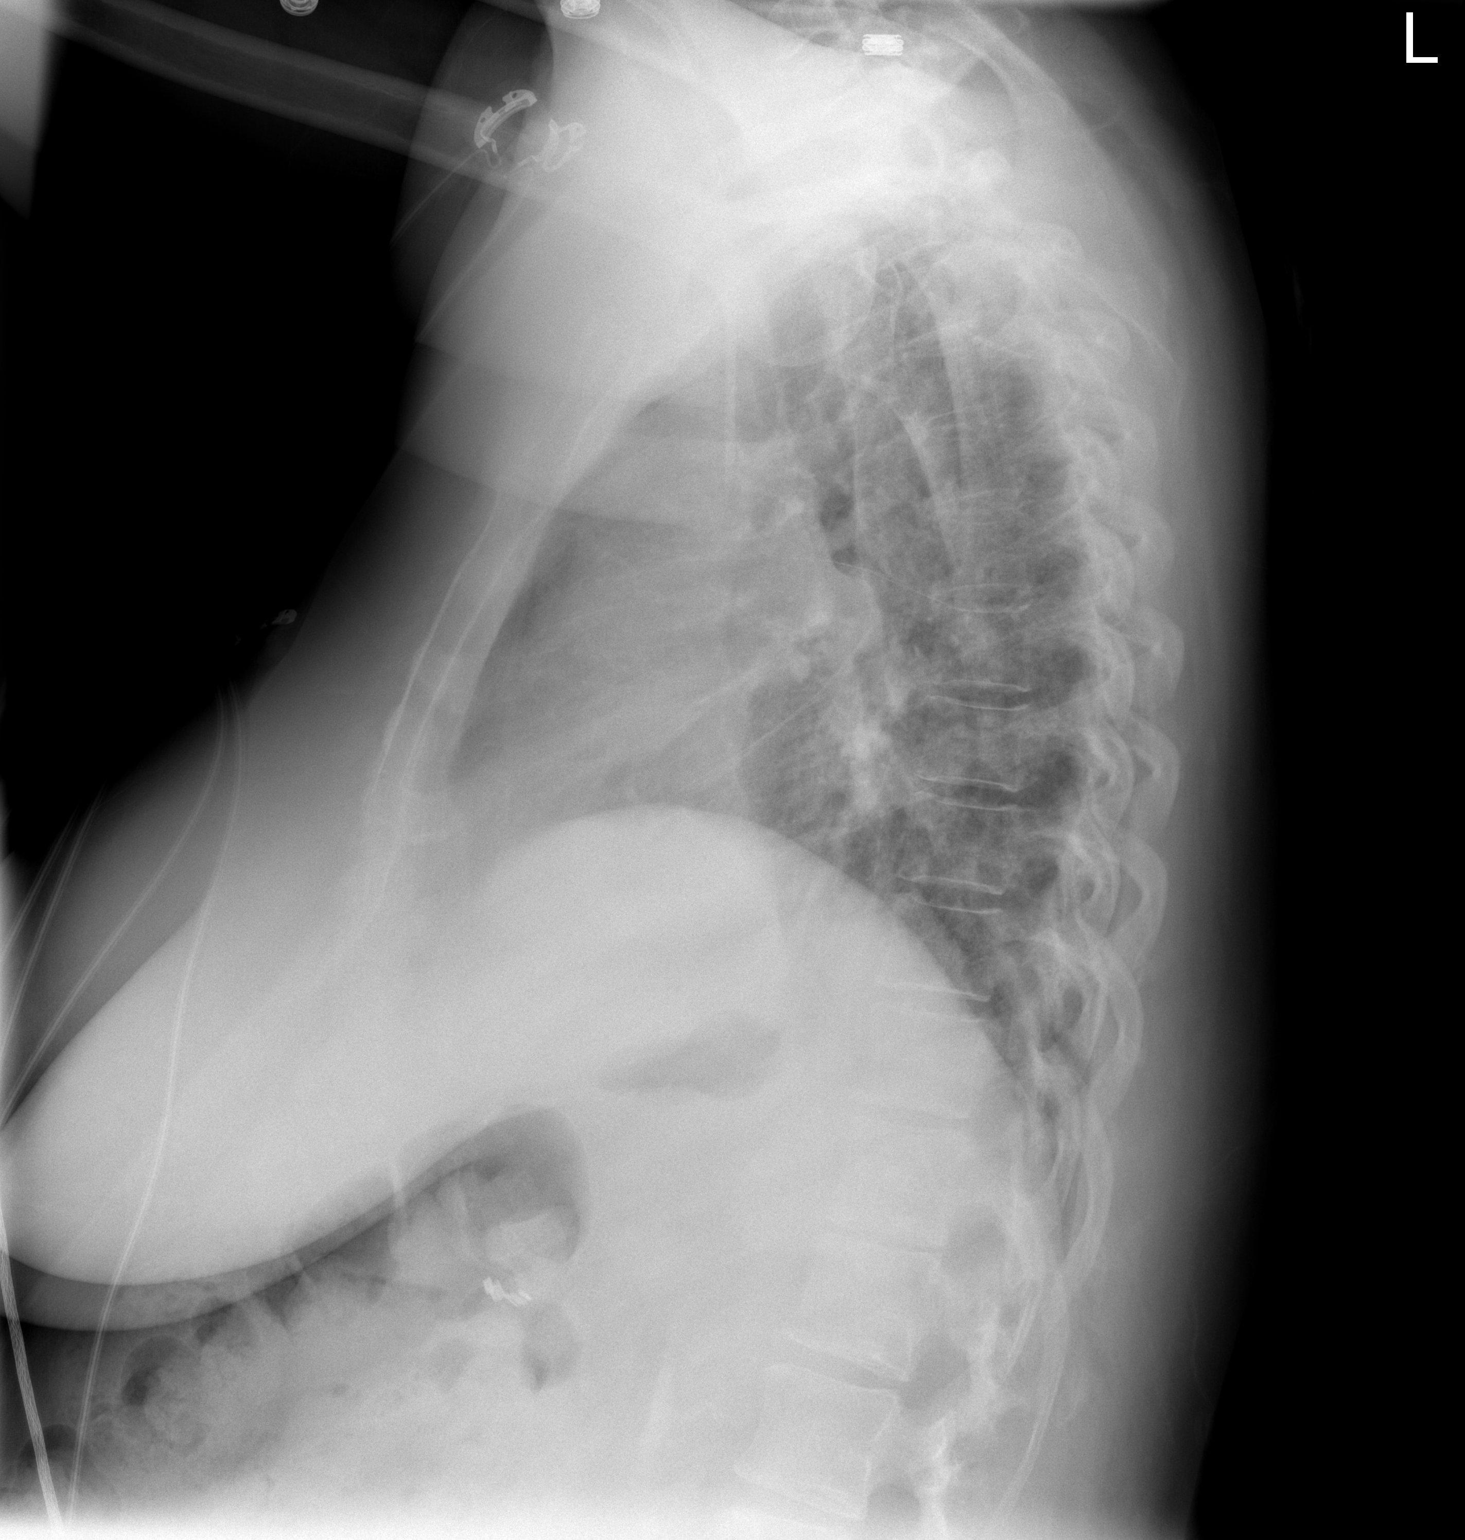

[2 of 2 positions shown; findings below may reference images not displayed]

FINDINGS: Right jugular line stable tip projecting over SVC.

Enlargement of cardiac silhouette.

Persistent atelectasis within left lung.

Persistent left hydropneumothorax unchanged.

Right lung remains clear.

No acute osseous findings.
IMPRESSION: Persistent left hydropneumothorax unchanged.

## 2015-01-21 ENCOUNTER — Telehealth: Payer: Self-pay

## 2015-01-21 NOTE — Telephone Encounter (Signed)
01/21/15 Received Disc from [facility] filed on shelf.Marie Chavez

## 2015-02-04 DIAGNOSIS — I10 Essential (primary) hypertension: Secondary | ICD-10-CM | POA: Diagnosis not present

## 2015-02-04 DIAGNOSIS — Z6836 Body mass index (BMI) 36.0-36.9, adult: Secondary | ICD-10-CM | POA: Diagnosis not present

## 2015-02-04 DIAGNOSIS — E782 Mixed hyperlipidemia: Secondary | ICD-10-CM | POA: Diagnosis not present

## 2015-02-04 DIAGNOSIS — J3089 Other allergic rhinitis: Secondary | ICD-10-CM | POA: Diagnosis not present

## 2015-02-04 DIAGNOSIS — E079 Disorder of thyroid, unspecified: Secondary | ICD-10-CM | POA: Diagnosis not present

## 2015-02-04 DIAGNOSIS — Z79899 Other long term (current) drug therapy: Secondary | ICD-10-CM | POA: Diagnosis not present

## 2015-02-04 DIAGNOSIS — Z79890 Hormone replacement therapy (postmenopausal): Secondary | ICD-10-CM | POA: Diagnosis not present

## 2015-02-04 DIAGNOSIS — F419 Anxiety disorder, unspecified: Secondary | ICD-10-CM | POA: Diagnosis not present

## 2015-02-04 DIAGNOSIS — M1990 Unspecified osteoarthritis, unspecified site: Secondary | ICD-10-CM | POA: Diagnosis not present

## 2015-02-10 DIAGNOSIS — M5442 Lumbago with sciatica, left side: Secondary | ICD-10-CM | POA: Diagnosis not present

## 2015-02-10 DIAGNOSIS — M9903 Segmental and somatic dysfunction of lumbar region: Secondary | ICD-10-CM | POA: Diagnosis not present

## 2015-02-10 DIAGNOSIS — M9904 Segmental and somatic dysfunction of sacral region: Secondary | ICD-10-CM | POA: Diagnosis not present

## 2015-02-10 DIAGNOSIS — M9902 Segmental and somatic dysfunction of thoracic region: Secondary | ICD-10-CM | POA: Diagnosis not present

## 2015-02-10 DIAGNOSIS — M5136 Other intervertebral disc degeneration, lumbar region: Secondary | ICD-10-CM | POA: Diagnosis not present

## 2015-02-14 DIAGNOSIS — F410 Panic disorder [episodic paroxysmal anxiety] without agoraphobia: Secondary | ICD-10-CM | POA: Diagnosis not present

## 2015-02-26 DIAGNOSIS — M545 Low back pain: Secondary | ICD-10-CM | POA: Diagnosis not present

## 2015-02-28 DIAGNOSIS — Z6836 Body mass index (BMI) 36.0-36.9, adult: Secondary | ICD-10-CM | POA: Diagnosis not present

## 2015-02-28 DIAGNOSIS — R51 Headache: Secondary | ICD-10-CM | POA: Diagnosis not present

## 2015-02-28 DIAGNOSIS — K529 Noninfective gastroenteritis and colitis, unspecified: Secondary | ICD-10-CM | POA: Diagnosis not present

## 2015-03-13 DIAGNOSIS — N359 Urethral stricture, unspecified: Secondary | ICD-10-CM | POA: Diagnosis not present

## 2015-03-13 DIAGNOSIS — N318 Other neuromuscular dysfunction of bladder: Secondary | ICD-10-CM | POA: Diagnosis not present

## 2015-03-13 DIAGNOSIS — R109 Unspecified abdominal pain: Secondary | ICD-10-CM | POA: Diagnosis not present

## 2015-03-13 DIAGNOSIS — N8111 Cystocele, midline: Secondary | ICD-10-CM | POA: Diagnosis not present

## 2015-03-13 DIAGNOSIS — N3090 Cystitis, unspecified without hematuria: Secondary | ICD-10-CM | POA: Diagnosis not present

## 2015-03-14 DIAGNOSIS — M659 Synovitis and tenosynovitis, unspecified: Secondary | ICD-10-CM | POA: Diagnosis not present

## 2015-03-14 DIAGNOSIS — M79642 Pain in left hand: Secondary | ICD-10-CM | POA: Diagnosis not present

## 2015-03-14 DIAGNOSIS — M7989 Other specified soft tissue disorders: Secondary | ICD-10-CM | POA: Diagnosis not present

## 2015-03-18 DIAGNOSIS — F410 Panic disorder [episodic paroxysmal anxiety] without agoraphobia: Secondary | ICD-10-CM | POA: Diagnosis not present

## 2015-03-21 DIAGNOSIS — Z6836 Body mass index (BMI) 36.0-36.9, adult: Secondary | ICD-10-CM | POA: Diagnosis not present

## 2015-03-21 DIAGNOSIS — B373 Candidiasis of vulva and vagina: Secondary | ICD-10-CM | POA: Diagnosis not present

## 2015-03-21 DIAGNOSIS — R350 Frequency of micturition: Secondary | ICD-10-CM | POA: Diagnosis not present

## 2015-04-21 DIAGNOSIS — M9904 Segmental and somatic dysfunction of sacral region: Secondary | ICD-10-CM | POA: Diagnosis not present

## 2015-04-21 DIAGNOSIS — M5442 Lumbago with sciatica, left side: Secondary | ICD-10-CM | POA: Diagnosis not present

## 2015-04-21 DIAGNOSIS — M9902 Segmental and somatic dysfunction of thoracic region: Secondary | ICD-10-CM | POA: Diagnosis not present

## 2015-04-21 DIAGNOSIS — M9903 Segmental and somatic dysfunction of lumbar region: Secondary | ICD-10-CM | POA: Diagnosis not present

## 2015-04-21 DIAGNOSIS — M5136 Other intervertebral disc degeneration, lumbar region: Secondary | ICD-10-CM | POA: Diagnosis not present

## 2015-05-13 DIAGNOSIS — I10 Essential (primary) hypertension: Secondary | ICD-10-CM | POA: Diagnosis not present

## 2015-05-13 DIAGNOSIS — Z1231 Encounter for screening mammogram for malignant neoplasm of breast: Secondary | ICD-10-CM | POA: Diagnosis not present

## 2015-05-13 DIAGNOSIS — K219 Gastro-esophageal reflux disease without esophagitis: Secondary | ICD-10-CM | POA: Diagnosis not present

## 2015-05-13 DIAGNOSIS — Z8709 Personal history of other diseases of the respiratory system: Secondary | ICD-10-CM | POA: Diagnosis not present

## 2015-05-13 DIAGNOSIS — T63441A Toxic effect of venom of bees, accidental (unintentional), initial encounter: Secondary | ICD-10-CM | POA: Diagnosis not present

## 2015-05-13 DIAGNOSIS — E782 Mixed hyperlipidemia: Secondary | ICD-10-CM | POA: Diagnosis not present

## 2015-05-13 DIAGNOSIS — Z6837 Body mass index (BMI) 37.0-37.9, adult: Secondary | ICD-10-CM | POA: Diagnosis not present

## 2015-05-13 DIAGNOSIS — E079 Disorder of thyroid, unspecified: Secondary | ICD-10-CM | POA: Diagnosis not present

## 2015-05-13 DIAGNOSIS — Z79899 Other long term (current) drug therapy: Secondary | ICD-10-CM | POA: Diagnosis not present

## 2015-05-13 DIAGNOSIS — M1990 Unspecified osteoarthritis, unspecified site: Secondary | ICD-10-CM | POA: Diagnosis not present

## 2015-05-20 DIAGNOSIS — Z1231 Encounter for screening mammogram for malignant neoplasm of breast: Secondary | ICD-10-CM | POA: Diagnosis not present

## 2015-06-10 DIAGNOSIS — F410 Panic disorder [episodic paroxysmal anxiety] without agoraphobia: Secondary | ICD-10-CM | POA: Diagnosis not present

## 2015-06-11 DIAGNOSIS — M539 Dorsopathy, unspecified: Secondary | ICD-10-CM | POA: Diagnosis not present

## 2015-06-11 DIAGNOSIS — E079 Disorder of thyroid, unspecified: Secondary | ICD-10-CM | POA: Diagnosis not present

## 2015-06-11 DIAGNOSIS — Z6836 Body mass index (BMI) 36.0-36.9, adult: Secondary | ICD-10-CM | POA: Diagnosis not present

## 2015-06-11 DIAGNOSIS — E782 Mixed hyperlipidemia: Secondary | ICD-10-CM | POA: Diagnosis not present

## 2015-06-11 DIAGNOSIS — Z79899 Other long term (current) drug therapy: Secondary | ICD-10-CM | POA: Diagnosis not present

## 2015-06-20 DIAGNOSIS — R0602 Shortness of breath: Secondary | ICD-10-CM | POA: Diagnosis not present

## 2015-06-20 DIAGNOSIS — F419 Anxiety disorder, unspecified: Secondary | ICD-10-CM | POA: Diagnosis not present

## 2015-08-20 DIAGNOSIS — R05 Cough: Secondary | ICD-10-CM | POA: Diagnosis not present

## 2015-08-20 DIAGNOSIS — J209 Acute bronchitis, unspecified: Secondary | ICD-10-CM | POA: Diagnosis not present

## 2015-08-20 DIAGNOSIS — E039 Hypothyroidism, unspecified: Secondary | ICD-10-CM | POA: Diagnosis not present

## 2015-08-20 DIAGNOSIS — Z79899 Other long term (current) drug therapy: Secondary | ICD-10-CM | POA: Diagnosis not present

## 2015-08-20 DIAGNOSIS — K219 Gastro-esophageal reflux disease without esophagitis: Secondary | ICD-10-CM | POA: Diagnosis not present

## 2015-08-20 DIAGNOSIS — F419 Anxiety disorder, unspecified: Secondary | ICD-10-CM | POA: Diagnosis not present

## 2015-08-20 DIAGNOSIS — R0602 Shortness of breath: Secondary | ICD-10-CM | POA: Diagnosis not present

## 2015-08-20 DIAGNOSIS — I10 Essential (primary) hypertension: Secondary | ICD-10-CM | POA: Diagnosis not present

## 2015-09-01 DIAGNOSIS — Z6837 Body mass index (BMI) 37.0-37.9, adult: Secondary | ICD-10-CM | POA: Diagnosis not present

## 2015-09-01 DIAGNOSIS — B370 Candidal stomatitis: Secondary | ICD-10-CM | POA: Diagnosis not present

## 2015-09-01 DIAGNOSIS — J40 Bronchitis, not specified as acute or chronic: Secondary | ICD-10-CM | POA: Diagnosis not present

## 2015-09-01 DIAGNOSIS — E663 Overweight: Secondary | ICD-10-CM | POA: Diagnosis not present

## 2015-09-01 DIAGNOSIS — B373 Candidiasis of vulva and vagina: Secondary | ICD-10-CM | POA: Diagnosis not present

## 2015-09-09 DIAGNOSIS — F410 Panic disorder [episodic paroxysmal anxiety] without agoraphobia: Secondary | ICD-10-CM | POA: Diagnosis not present

## 2015-09-15 ENCOUNTER — Other Ambulatory Visit: Payer: Self-pay | Admitting: *Deleted

## 2015-09-15 DIAGNOSIS — M539 Dorsopathy, unspecified: Secondary | ICD-10-CM | POA: Diagnosis not present

## 2015-09-15 DIAGNOSIS — I10 Essential (primary) hypertension: Secondary | ICD-10-CM | POA: Diagnosis not present

## 2015-09-15 DIAGNOSIS — Z6837 Body mass index (BMI) 37.0-37.9, adult: Secondary | ICD-10-CM | POA: Diagnosis not present

## 2015-09-15 DIAGNOSIS — C3412 Malignant neoplasm of upper lobe, left bronchus or lung: Secondary | ICD-10-CM

## 2015-09-15 DIAGNOSIS — K219 Gastro-esophageal reflux disease without esophagitis: Secondary | ICD-10-CM | POA: Diagnosis not present

## 2015-09-15 DIAGNOSIS — E782 Mixed hyperlipidemia: Secondary | ICD-10-CM | POA: Diagnosis not present

## 2015-09-15 DIAGNOSIS — Z79890 Hormone replacement therapy (postmenopausal): Secondary | ICD-10-CM | POA: Diagnosis not present

## 2015-09-15 DIAGNOSIS — Z79899 Other long term (current) drug therapy: Secondary | ICD-10-CM | POA: Diagnosis not present

## 2015-09-15 DIAGNOSIS — E079 Disorder of thyroid, unspecified: Secondary | ICD-10-CM | POA: Diagnosis not present

## 2015-09-15 DIAGNOSIS — R05 Cough: Secondary | ICD-10-CM | POA: Diagnosis not present

## 2015-09-29 DIAGNOSIS — M9904 Segmental and somatic dysfunction of sacral region: Secondary | ICD-10-CM | POA: Diagnosis not present

## 2015-09-29 DIAGNOSIS — M9902 Segmental and somatic dysfunction of thoracic region: Secondary | ICD-10-CM | POA: Diagnosis not present

## 2015-09-29 DIAGNOSIS — M5442 Lumbago with sciatica, left side: Secondary | ICD-10-CM | POA: Diagnosis not present

## 2015-09-29 DIAGNOSIS — M9903 Segmental and somatic dysfunction of lumbar region: Secondary | ICD-10-CM | POA: Diagnosis not present

## 2015-09-29 DIAGNOSIS — M5136 Other intervertebral disc degeneration, lumbar region: Secondary | ICD-10-CM | POA: Diagnosis not present

## 2015-10-03 DIAGNOSIS — M9902 Segmental and somatic dysfunction of thoracic region: Secondary | ICD-10-CM | POA: Diagnosis not present

## 2015-10-03 DIAGNOSIS — M5136 Other intervertebral disc degeneration, lumbar region: Secondary | ICD-10-CM | POA: Diagnosis not present

## 2015-10-03 DIAGNOSIS — M9904 Segmental and somatic dysfunction of sacral region: Secondary | ICD-10-CM | POA: Diagnosis not present

## 2015-10-03 DIAGNOSIS — M9903 Segmental and somatic dysfunction of lumbar region: Secondary | ICD-10-CM | POA: Diagnosis not present

## 2015-10-03 DIAGNOSIS — M5442 Lumbago with sciatica, left side: Secondary | ICD-10-CM | POA: Diagnosis not present

## 2015-10-06 DIAGNOSIS — F410 Panic disorder [episodic paroxysmal anxiety] without agoraphobia: Secondary | ICD-10-CM | POA: Diagnosis not present

## 2015-10-14 ENCOUNTER — Other Ambulatory Visit: Payer: Medicare Other

## 2015-10-14 ENCOUNTER — Encounter: Payer: Medicare Other | Admitting: Cardiothoracic Surgery

## 2015-10-14 DIAGNOSIS — J0100 Acute maxillary sinusitis, unspecified: Secondary | ICD-10-CM | POA: Diagnosis not present

## 2015-10-14 NOTE — Progress Notes (Signed)
[address]       [place] [address]             [phone]      Marie Chavez [facility] Medical Record #[id number] Date of Birth: 1956/10/02  Referring: Chesley Mires, MD Primary Care: Christ Kick, MD  Chief Complaint:   POST OP FOLLOW UP DATE OF PROCEDURE: 11/28/2013  OPERATIVE REPORT  PREOPERATIVE DIAGNOSIS: Slowly enlarging left upper lobe lung mass,  question carcinoid.  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  SURGICAL PROCEDURE: Bronchoscopy, left video-assisted thoracoscopy,  minithoracotomy, left upper lobectomy with placement of On-Q device.  SURGEON: Lanelle Bal, MD  PATH:Lung, resection (segmental or lobe), Left upper lobe - WELL DIFFERENTIATED, LOW GRADE NEUROENDOCRINE TUMOR (CARCINOID). SEE COMMENT. - NO LYMPH-VASCULAR INVASION IDENTIFIED. - BRONCHIAL MARGIN, NEGATIVE FOR TUMOR.  History of Present Illness:     Patient has  some mild shortness of breath .  She notes she's had mild swelling in her hands and feet recently . Was very surprised in the office today when she was weighed , noted that she never weighed 220 pounds before . She's had no fever chills.She does note she still caring forher 36-year-old grandson because of drug addiction problems with her daughter.   She denies any hemoptysis. She was seen in [place] in early December and was treated for "pneumonia". She denied any fever chills or cough at that time   Past Medical History  Diagnosis Date  . Hypertension   . Panic disorder   . Herniated disc   . DJD (degenerative joint disease)   . PONV (postoperative nausea and vomiting)     Had a panic attack upon awakening from Hysterectomy   . Tachycardia     Takes Atenolol  . Bronchitis     Hx of  . Environmental allergies   . Seasonal allergies   . Pneumonia     Hx of "walking Pneumonia"   . Shortness of breath     when bending over or just sitting thinks  it comes from lung mass  . Depression   . GERD (gastroesophageal reflux disease)   . Hypothyroidism   . Hypoglycemia   . Urgency of urination   . Dry skin      History  Smoking status  . Never Smoker   Smokeless tobacco  . Not on file    History  Alcohol Use No     Allergies  Allergen Reactions  . Bee Venom Anaphylaxis and Other (See Comments)    Developed "blue rings" all over body after being stung  . Celebrex [Celecoxib]     REACTION: heart rate increases, itching  . Mobic [Meloxicam] Itching    HEART RACES  . Prednisone     REACTION: Heart rate increases, itching    Current Outpatient Prescriptions  Medication Sig Dispense Refill  . atenolol (TENORMIN) 50 MG tablet Take 50 mg by mouth daily.      . clonazePAM (KLONOPIN) 1 MG tablet Take 1 mg by mouth 2 (two) times daily as needed for anxiety.     Marland Kitchen EPIPEN 2-PAK 0.3 MG/0.3ML SOAJ injection Inject 0.3 mg as directed once. As needed    . escitalopram (LEXAPRO) 10 MG tablet Take 10 mg by mouth at bedtime.     Marland Kitchen esomeprazole (NEXIUM) 40 MG capsule Take 40 mg by mouth daily before breakfast.      . estradiol (VIVELLE-DOT) 0.05 MG/24HR  Place 1 patch onto the skin once a week. On Saturday    . fluticasone (FLONASE) 50 MCG/ACT nasal spray Place 2 sprays into both nostrils daily as needed for allergies.     Marland Kitchen gabapentin (NEURONTIN) 300 MG capsule Take 300 mg by mouth at bedtime as needed.    Marland Kitchen levothyroxine (SYNTHROID, LEVOTHROID) 175 MCG tablet     . lisinopril (PRINIVIL,ZESTRIL) 5 MG tablet Take 5 mg by mouth daily.     . mometasone (NASONEX) 50 MCG/ACT nasal spray Place 2 sprays into both nostrils daily.    . nabumetone (RELAFEN) 750 MG tablet Take 750 mg by mouth at bedtime as needed for mild pain.     Marland Kitchen PROAIR HFA 108 (90 BASE) MCG/ACT inhaler   0  . promethazine (PHENERGAN) 25 MG tablet Take 25 mg by mouth every 6 (six) hours as needed for nausea or vomiting.     No current facility-administered medications for this  visit.       Physical Exam: There were no vitals taken for this visit.  General appearance: alert, cooperative and no distress Neurologic: intact Heart: regular rate and rhythm, S1, S2 normal, no murmur, click, rub or gallop Lungs: diminished breath sounds LLL Abdomen: soft, non-tender; bowel sounds normal; no masses,  no organomegaly Extremities: extremities normal, atraumatic, no cyanosis or edema and Homans sign is negative, no sign of DVT Wound: Incision and chest tube sites and port sites are all well-healed without evidence of infection Patient has no cervical or supraclavicular or axillary adenopathy  Diagnostic Studies & Laboratory data:     Recent Radiology Findings:   No results found.    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

## 2015-10-28 DIAGNOSIS — Z23 Encounter for immunization: Secondary | ICD-10-CM | POA: Diagnosis not present

## 2015-11-07 DIAGNOSIS — J309 Allergic rhinitis, unspecified: Secondary | ICD-10-CM | POA: Diagnosis not present

## 2015-11-07 DIAGNOSIS — K120 Recurrent oral aphthae: Secondary | ICD-10-CM | POA: Diagnosis not present

## 2015-11-07 DIAGNOSIS — J069 Acute upper respiratory infection, unspecified: Secondary | ICD-10-CM | POA: Diagnosis not present

## 2015-11-07 DIAGNOSIS — Z6837 Body mass index (BMI) 37.0-37.9, adult: Secondary | ICD-10-CM | POA: Diagnosis not present

## 2015-11-26 DIAGNOSIS — H5203 Hypermetropia, bilateral: Secondary | ICD-10-CM | POA: Diagnosis not present

## 2015-11-26 DIAGNOSIS — H52223 Regular astigmatism, bilateral: Secondary | ICD-10-CM | POA: Diagnosis not present

## 2015-11-26 DIAGNOSIS — H43813 Vitreous degeneration, bilateral: Secondary | ICD-10-CM | POA: Diagnosis not present

## 2015-11-26 DIAGNOSIS — H524 Presbyopia: Secondary | ICD-10-CM | POA: Diagnosis not present

## 2015-11-26 DIAGNOSIS — R1013 Epigastric pain: Secondary | ICD-10-CM | POA: Diagnosis not present

## 2015-11-27 ENCOUNTER — Other Ambulatory Visit: Payer: Self-pay | Admitting: Cardiothoracic Surgery

## 2015-11-27 ENCOUNTER — Ambulatory Visit
Admission: RE | Admit: 2015-11-27 | Discharge: 2015-11-27 | Disposition: A | Discharge status: Home / Self Care | Payer: Medicare Other | Source: Ambulatory Visit | Attending: Cardiothoracic Surgery | Admitting: Cardiothoracic Surgery

## 2015-11-27 ENCOUNTER — Encounter: Payer: Self-pay | Admitting: Cardiothoracic Surgery

## 2015-11-27 ENCOUNTER — Ambulatory Visit (INDEPENDENT_AMBULATORY_CARE_PROVIDER_SITE_OTHER): Payer: Medicare Other | Admitting: Cardiothoracic Surgery

## 2015-11-27 VITALS — BP 129/73 | HR 62 | Resp 20 | Ht 65.0 in | Wt 233.0 lb

## 2015-11-27 DIAGNOSIS — D3A00 Benign carcinoid tumor of unspecified site: Secondary | ICD-10-CM

## 2015-11-27 DIAGNOSIS — Z85118 Personal history of other malignant neoplasm of bronchus and lung: Secondary | ICD-10-CM | POA: Diagnosis not present

## 2015-11-27 DIAGNOSIS — Z902 Acquired absence of lung [part of]: Secondary | ICD-10-CM

## 2015-11-27 DIAGNOSIS — C3412 Malignant neoplasm of upper lobe, left bronchus or lung: Secondary | ICD-10-CM

## 2015-11-27 NOTE — Progress Notes (Signed)
[address]       [place] [address]             [phone]      Mariely M Lizardo [facility] Medical Record #[id number] Date of Birth: 11-29-1955  Referring: Chesley Mires, MD Primary Care: Christ Kick, MD  Chief Complaint:   POST OP FOLLOW UP DATE OF PROCEDURE: 11/28/2013  OPERATIVE REPORT  PREOPERATIVE DIAGNOSIS: Slowly enlarging left upper lobe lung mass,  question carcinoid.  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  SURGICAL PROCEDURE: Bronchoscopy, left video-assisted thoracoscopy,  minithoracotomy, left upper lobectomy with placement of On-Q device.  SURGEON: Lanelle Bal, MD  PATH:Lung, resection (segmental or lobe), Left upper lobe - WELL DIFFERENTIATED, LOW GRADE NEUROENDOCRINE TUMOR (CARCINOID). SEE COMMENT. - NO LYMPH-VASCULAR INVASION IDENTIFIED. - BRONCHIAL MARGIN, NEGATIVE FOR TUMOR.    History of Present Illness:     Patient has  some mild shortness of breath . She notes a cough especially at night no hemoptysis. She's been prescribed bronchodilators by her primary care physician but only uses an episodically.   Past Medical History  Diagnosis Date  . Hypertension   . Panic disorder   . Herniated disc   . DJD (degenerative joint disease)   . PONV (postoperative nausea and vomiting)     Had a panic attack upon awakening from Hysterectomy   . Tachycardia     Takes Atenolol  . Bronchitis     Hx of  . Environmental allergies   . Seasonal allergies   . Pneumonia     Hx of "walking Pneumonia"   . Shortness of breath     when bending over or just sitting thinks it comes from lung mass  . Depression   . GERD (gastroesophageal reflux disease)   . Hypothyroidism   . Hypoglycemia   . Urgency of urination   . Dry skin      History  Smoking status  . Never Smoker   Smokeless tobacco  . Not on file    History  Alcohol Use No     Allergies  Allergen  Reactions  . Bee Venom Anaphylaxis and Other (See Comments)    Developed "blue rings" all over body after being stung  . Celebrex [Celecoxib]     REACTION: heart rate increases, itching  . Mobic [Meloxicam] Itching    HEART RACES  . Prednisone     REACTION: Heart rate increases, itching    Current Outpatient Prescriptions  Medication Sig Dispense Refill  . atenolol (TENORMIN) 50 MG tablet Take 50 mg by mouth daily.      . clonazePAM (KLONOPIN) 1 MG tablet Take 1 mg by mouth 2 (two) times daily as needed for anxiety.     Marland Kitchen EPIPEN 2-PAK 0.3 MG/0.3ML SOAJ injection Inject 0.3 mg as directed once. As needed    . escitalopram (LEXAPRO) 10 MG tablet Take 10 mg by mouth at bedtime.     Marland Kitchen esomeprazole (NEXIUM) 40 MG capsule Take 40 mg by mouth daily before breakfast.      . estradiol (VIVELLE-DOT) 0.05 MG/24HR Place 1 patch onto the skin once a week. On Saturday    . fluticasone (FLONASE) 50 MCG/ACT nasal spray Place 2 sprays into both nostrils daily as needed for allergies.     Marland Kitchen gabapentin (NEURONTIN) 300 MG capsule Take 300 mg by mouth at bedtime as needed.    Marland Kitchen levothyroxine (SYNTHROID, LEVOTHROID) 175 MCG  tablet     . lisinopril (PRINIVIL,ZESTRIL) 5 MG tablet Take 5 mg by mouth daily.     . mometasone (NASONEX) 50 MCG/ACT nasal spray Place 2 sprays into both nostrils daily.    . nabumetone (RELAFEN) 750 MG tablet Take 750 mg by mouth at bedtime as needed for mild pain.     Marland Kitchen PROAIR HFA 108 (90 BASE) MCG/ACT inhaler   0  . promethazine (PHENERGAN) 25 MG tablet Take 25 mg by mouth every 6 (six) hours as needed for nausea or vomiting.     No current facility-administered medications for this visit.       Physical Exam: BP 129/73 mmHg  Pulse 62  Resp 20  Ht '5\' 5"'$  (1.651 m)  Wt 233 lb (105.688 kg)  BMI 38.77 kg/m2  SpO2 96%  General appearance: alert, cooperative and no distress Neurologic: intact Heart: regular rate and rhythm, S1, S2 normal, no murmur, click, rub or  gallop Lungs: diminished breath sounds LLL Abdomen: soft, non-tender; bowel sounds normal; no masses,  no organomegaly Extremities: extremities normal, atraumatic, no cyanosis or edema and Homans sign is negative, no sign of DVT Wound: Incision and chest tube sites and port sites are all well-healed without evidence of infection Patient has no cervical or supraclavicular or axillary adenopathy  Diagnostic Studies & Laboratory data:     Recent Radiology Findings:   Ct Chest Wo Contrast  11/27/2015  CLINICAL DATA:  60 year old female with history of lung cancer status post left upper lobectomy on 11/28/2013. Followup study. EXAM: CT CHEST WITHOUT CONTRAST TECHNIQUE: Multidetector CT imaging of the chest was performed following the standard protocol without IV contrast. COMPARISON:  Chest CT 10/24/2014. FINDINGS: Mediastinum/Lymph Nodes: Heart size is normal. There is no significant pericardial fluid, thickening or pericardial calcification. There is atherosclerosis of the thoracic aorta, the great vessels of the mediastinum and the coronary arteries, including calcified atherosclerotic plaque in the left anterior descending and right coronary arteries. No pathologically enlarged mediastinal or hilar lymph nodes. Please note that accurate exclusion of hilar adenopathy is limited on noncontrast CT scans. Esophagus is unremarkable in appearance. No axillary lymphadenopathy. Lungs/Pleura: Status post left upper lobectomy. Compensatory hyperexpansion of the left lower lobe. Mild linear scarring in the basal segments of the left lower lobe anteriorly, unchanged. No suspicious appearing pulmonary nodules or masses. No pleural nodularity or pleural effusion. No acute consolidative airspace disease. Upper Abdomen: Diffuse low attenuation throughout the hepatic parenchyma, compatible with hepatic steatosis. Status post cholecystectomy. Musculoskeletal/Soft Tissues: There are no aggressive appearing lytic or blastic  lesions noted in the visualized portions of the skeleton. IMPRESSION: 1. Status post left upper lobectomy. No findings to suggest local recurrence of disease or metastatic disease in the thorax. 2. Atherosclerosis, including 2 vessel coronary artery disease. Please note that although the presence of coronary artery calcium documents the presence of coronary artery disease, the severity of this disease and any potential stenosis cannot be assessed on this non-gated CT examination. Assessment for potential risk factor modification, dietary therapy or pharmacologic therapy may be warranted, if clinically indicated. 3. Hepatic steatosis. Electronically Signed   By: Vinnie Langton M.D.   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 Please note that although the presence of coronary artery calcium documents the presence of coronary artery disease, the severity of this disease and any potential stenosis cannot be assessed on this non-gated CT examination.-I discussed with the patient the findings of calcium in the coronary arteries and encouraged her to take her statin as prescribed. I reviewed with her the signs and symptoms of angina and suggested to her to seek medical attention immediately should she have any symptoms.  I plan to see her back as needed Grace Isaac MD      [address] [place] [address] Office [phone]   Beeper [phone]  11/27/2015 1:05 PM

## 2015-12-02 DIAGNOSIS — R05 Cough: Secondary | ICD-10-CM | POA: Diagnosis not present

## 2015-12-02 DIAGNOSIS — J019 Acute sinusitis, unspecified: Secondary | ICD-10-CM | POA: Diagnosis not present

## 2015-12-11 DIAGNOSIS — N3020 Other chronic cystitis without hematuria: Secondary | ICD-10-CM | POA: Diagnosis not present

## 2015-12-11 DIAGNOSIS — R3915 Urgency of urination: Secondary | ICD-10-CM | POA: Diagnosis not present

## 2015-12-11 DIAGNOSIS — N318 Other neuromuscular dysfunction of bladder: Secondary | ICD-10-CM | POA: Diagnosis not present

## 2015-12-11 DIAGNOSIS — R351 Nocturia: Secondary | ICD-10-CM | POA: Diagnosis not present

## 2015-12-11 DIAGNOSIS — N3281 Overactive bladder: Secondary | ICD-10-CM | POA: Diagnosis not present

## 2015-12-15 DIAGNOSIS — F410 Panic disorder [episodic paroxysmal anxiety] without agoraphobia: Secondary | ICD-10-CM | POA: Diagnosis not present

## 2015-12-18 DIAGNOSIS — R350 Frequency of micturition: Secondary | ICD-10-CM | POA: Diagnosis not present

## 2015-12-18 DIAGNOSIS — R351 Nocturia: Secondary | ICD-10-CM | POA: Diagnosis not present

## 2015-12-18 DIAGNOSIS — R3989 Other symptoms and signs involving the genitourinary system: Secondary | ICD-10-CM | POA: Diagnosis not present

## 2015-12-18 DIAGNOSIS — N3946 Mixed incontinence: Secondary | ICD-10-CM | POA: Diagnosis not present

## 2015-12-19 DIAGNOSIS — Z6838 Body mass index (BMI) 38.0-38.9, adult: Secondary | ICD-10-CM | POA: Diagnosis not present

## 2015-12-19 DIAGNOSIS — Z008 Encounter for other general examination: Secondary | ICD-10-CM | POA: Diagnosis not present

## 2015-12-19 DIAGNOSIS — M949 Disorder of cartilage, unspecified: Secondary | ICD-10-CM | POA: Diagnosis not present

## 2015-12-19 DIAGNOSIS — Z1389 Encounter for screening for other disorder: Secondary | ICD-10-CM | POA: Diagnosis not present

## 2015-12-19 DIAGNOSIS — Z1211 Encounter for screening for malignant neoplasm of colon: Secondary | ICD-10-CM | POA: Diagnosis not present

## 2015-12-19 DIAGNOSIS — E785 Hyperlipidemia, unspecified: Secondary | ICD-10-CM | POA: Diagnosis not present

## 2015-12-19 DIAGNOSIS — I10 Essential (primary) hypertension: Secondary | ICD-10-CM | POA: Diagnosis not present

## 2015-12-19 DIAGNOSIS — M2550 Pain in unspecified joint: Secondary | ICD-10-CM | POA: Diagnosis not present

## 2015-12-19 DIAGNOSIS — J029 Acute pharyngitis, unspecified: Secondary | ICD-10-CM | POA: Diagnosis not present

## 2015-12-19 DIAGNOSIS — Z713 Dietary counseling and surveillance: Secondary | ICD-10-CM | POA: Diagnosis not present

## 2015-12-19 DIAGNOSIS — R05 Cough: Secondary | ICD-10-CM | POA: Diagnosis not present

## 2015-12-19 DIAGNOSIS — R5381 Other malaise: Secondary | ICD-10-CM | POA: Diagnosis not present

## 2015-12-25 DIAGNOSIS — R350 Frequency of micturition: Secondary | ICD-10-CM | POA: Diagnosis not present

## 2015-12-25 DIAGNOSIS — R3989 Other symptoms and signs involving the genitourinary system: Secondary | ICD-10-CM | POA: Diagnosis not present

## 2015-12-25 DIAGNOSIS — N3946 Mixed incontinence: Secondary | ICD-10-CM | POA: Diagnosis not present

## 2015-12-25 DIAGNOSIS — R351 Nocturia: Secondary | ICD-10-CM | POA: Diagnosis not present

## 2015-12-31 DIAGNOSIS — R05 Cough: Secondary | ICD-10-CM | POA: Diagnosis not present

## 2015-12-31 DIAGNOSIS — E785 Hyperlipidemia, unspecified: Secondary | ICD-10-CM | POA: Diagnosis not present

## 2015-12-31 DIAGNOSIS — N951 Menopausal and female climacteric states: Secondary | ICD-10-CM | POA: Diagnosis not present

## 2015-12-31 DIAGNOSIS — J111 Influenza due to unidentified influenza virus with other respiratory manifestations: Secondary | ICD-10-CM | POA: Diagnosis not present

## 2016-02-18 DIAGNOSIS — F410 Panic disorder [episodic paroxysmal anxiety] without agoraphobia: Secondary | ICD-10-CM | POA: Diagnosis not present

## 2016-02-24 DIAGNOSIS — R350 Frequency of micturition: Secondary | ICD-10-CM | POA: Diagnosis not present

## 2016-02-24 DIAGNOSIS — N3946 Mixed incontinence: Secondary | ICD-10-CM | POA: Diagnosis not present

## 2016-02-24 DIAGNOSIS — R351 Nocturia: Secondary | ICD-10-CM | POA: Diagnosis not present

## 2016-02-24 DIAGNOSIS — R3989 Other symptoms and signs involving the genitourinary system: Secondary | ICD-10-CM | POA: Diagnosis not present

## 2016-03-16 DIAGNOSIS — R279 Unspecified lack of coordination: Secondary | ICD-10-CM | POA: Diagnosis not present

## 2016-03-16 DIAGNOSIS — M542 Cervicalgia: Secondary | ICD-10-CM | POA: Diagnosis not present

## 2016-03-16 DIAGNOSIS — M797 Fibromyalgia: Secondary | ICD-10-CM | POA: Diagnosis not present

## 2016-03-16 DIAGNOSIS — B9681 Helicobacter pylori [H. pylori] as the cause of diseases classified elsewhere: Secondary | ICD-10-CM | POA: Diagnosis not present

## 2016-03-16 DIAGNOSIS — M6281 Muscle weakness (generalized): Secondary | ICD-10-CM | POA: Diagnosis not present

## 2016-03-16 DIAGNOSIS — R42 Dizziness and giddiness: Secondary | ICD-10-CM | POA: Diagnosis not present

## 2016-03-16 DIAGNOSIS — R1084 Generalized abdominal pain: Secondary | ICD-10-CM | POA: Diagnosis not present

## 2016-03-17 DIAGNOSIS — M797 Fibromyalgia: Secondary | ICD-10-CM | POA: Diagnosis not present

## 2016-03-17 DIAGNOSIS — R279 Unspecified lack of coordination: Secondary | ICD-10-CM | POA: Diagnosis not present

## 2016-03-17 DIAGNOSIS — R42 Dizziness and giddiness: Secondary | ICD-10-CM | POA: Diagnosis not present

## 2016-03-17 DIAGNOSIS — M542 Cervicalgia: Secondary | ICD-10-CM | POA: Diagnosis not present

## 2016-03-17 DIAGNOSIS — M6281 Muscle weakness (generalized): Secondary | ICD-10-CM | POA: Diagnosis not present

## 2016-03-25 DIAGNOSIS — M6281 Muscle weakness (generalized): Secondary | ICD-10-CM | POA: Diagnosis not present

## 2016-03-25 DIAGNOSIS — R42 Dizziness and giddiness: Secondary | ICD-10-CM | POA: Diagnosis not present

## 2016-03-25 DIAGNOSIS — M542 Cervicalgia: Secondary | ICD-10-CM | POA: Diagnosis not present

## 2016-03-25 DIAGNOSIS — M1990 Unspecified osteoarthritis, unspecified site: Secondary | ICD-10-CM | POA: Diagnosis not present

## 2016-03-25 DIAGNOSIS — R279 Unspecified lack of coordination: Secondary | ICD-10-CM | POA: Diagnosis not present

## 2016-03-25 DIAGNOSIS — M2560 Stiffness of unspecified joint, not elsewhere classified: Secondary | ICD-10-CM | POA: Diagnosis not present

## 2016-03-25 DIAGNOSIS — M797 Fibromyalgia: Secondary | ICD-10-CM | POA: Diagnosis not present

## 2016-03-30 DIAGNOSIS — M5136 Other intervertebral disc degeneration, lumbar region: Secondary | ICD-10-CM | POA: Diagnosis not present

## 2016-03-30 DIAGNOSIS — M5442 Lumbago with sciatica, left side: Secondary | ICD-10-CM | POA: Diagnosis not present

## 2016-03-30 DIAGNOSIS — M9904 Segmental and somatic dysfunction of sacral region: Secondary | ICD-10-CM | POA: Diagnosis not present

## 2016-03-30 DIAGNOSIS — M9902 Segmental and somatic dysfunction of thoracic region: Secondary | ICD-10-CM | POA: Diagnosis not present

## 2016-03-30 DIAGNOSIS — M9903 Segmental and somatic dysfunction of lumbar region: Secondary | ICD-10-CM | POA: Diagnosis not present

## 2016-04-14 DIAGNOSIS — F410 Panic disorder [episodic paroxysmal anxiety] without agoraphobia: Secondary | ICD-10-CM | POA: Diagnosis not present

## 2016-04-20 DIAGNOSIS — Z130 Encounter for screening for diseases of the blood and blood-forming organs and certain disorders involving the immune mechanism: Secondary | ICD-10-CM | POA: Diagnosis not present

## 2016-04-20 DIAGNOSIS — E782 Mixed hyperlipidemia: Secondary | ICD-10-CM | POA: Diagnosis not present

## 2016-04-20 DIAGNOSIS — I10 Essential (primary) hypertension: Secondary | ICD-10-CM | POA: Diagnosis not present

## 2016-04-20 DIAGNOSIS — B179 Acute viral hepatitis, unspecified: Secondary | ICD-10-CM | POA: Diagnosis not present

## 2016-04-20 DIAGNOSIS — E039 Hypothyroidism, unspecified: Secondary | ICD-10-CM | POA: Diagnosis not present

## 2016-04-20 DIAGNOSIS — R252 Cramp and spasm: Secondary | ICD-10-CM | POA: Diagnosis not present

## 2016-04-20 DIAGNOSIS — E1165 Type 2 diabetes mellitus with hyperglycemia: Secondary | ICD-10-CM | POA: Diagnosis not present

## 2016-04-20 DIAGNOSIS — E559 Vitamin D deficiency, unspecified: Secondary | ICD-10-CM | POA: Diagnosis not present

## 2016-04-20 DIAGNOSIS — E6609 Other obesity due to excess calories: Secondary | ICD-10-CM | POA: Diagnosis not present

## 2016-04-20 DIAGNOSIS — Z713 Dietary counseling and surveillance: Secondary | ICD-10-CM | POA: Diagnosis not present

## 2016-04-20 DIAGNOSIS — E790 Hyperuricemia without signs of inflammatory arthritis and tophaceous disease: Secondary | ICD-10-CM | POA: Diagnosis not present

## 2016-04-20 DIAGNOSIS — Z79890 Hormone replacement therapy (postmenopausal): Secondary | ICD-10-CM | POA: Diagnosis not present

## 2016-05-17 DIAGNOSIS — M542 Cervicalgia: Secondary | ICD-10-CM | POA: Diagnosis not present

## 2016-05-17 DIAGNOSIS — M9903 Segmental and somatic dysfunction of lumbar region: Secondary | ICD-10-CM | POA: Diagnosis not present

## 2016-05-17 DIAGNOSIS — M5442 Lumbago with sciatica, left side: Secondary | ICD-10-CM | POA: Diagnosis not present

## 2016-05-17 DIAGNOSIS — M5136 Other intervertebral disc degeneration, lumbar region: Secondary | ICD-10-CM | POA: Diagnosis not present

## 2016-05-17 DIAGNOSIS — I10 Essential (primary) hypertension: Secondary | ICD-10-CM | POA: Diagnosis not present

## 2016-05-17 DIAGNOSIS — M9904 Segmental and somatic dysfunction of sacral region: Secondary | ICD-10-CM | POA: Diagnosis not present

## 2016-05-17 DIAGNOSIS — M9902 Segmental and somatic dysfunction of thoracic region: Secondary | ICD-10-CM | POA: Diagnosis not present

## 2016-05-17 DIAGNOSIS — M79602 Pain in left arm: Secondary | ICD-10-CM | POA: Diagnosis not present

## 2016-05-18 DIAGNOSIS — R52 Pain, unspecified: Secondary | ICD-10-CM | POA: Diagnosis not present

## 2016-05-18 DIAGNOSIS — M5136 Other intervertebral disc degeneration, lumbar region: Secondary | ICD-10-CM | POA: Diagnosis not present

## 2016-05-18 DIAGNOSIS — M5489 Other dorsalgia: Secondary | ICD-10-CM | POA: Diagnosis not present

## 2016-05-18 DIAGNOSIS — M9903 Segmental and somatic dysfunction of lumbar region: Secondary | ICD-10-CM | POA: Diagnosis not present

## 2016-05-18 DIAGNOSIS — M9902 Segmental and somatic dysfunction of thoracic region: Secondary | ICD-10-CM | POA: Diagnosis not present

## 2016-05-18 DIAGNOSIS — M9904 Segmental and somatic dysfunction of sacral region: Secondary | ICD-10-CM | POA: Diagnosis not present

## 2016-05-18 DIAGNOSIS — M5442 Lumbago with sciatica, left side: Secondary | ICD-10-CM | POA: Diagnosis not present

## 2016-05-18 DIAGNOSIS — M542 Cervicalgia: Secondary | ICD-10-CM | POA: Diagnosis not present

## 2016-05-20 DIAGNOSIS — M50223 Other cervical disc displacement at C6-C7 level: Secondary | ICD-10-CM | POA: Diagnosis not present

## 2016-05-20 DIAGNOSIS — M79602 Pain in left arm: Secondary | ICD-10-CM | POA: Diagnosis not present

## 2016-05-20 DIAGNOSIS — M47812 Spondylosis without myelopathy or radiculopathy, cervical region: Secondary | ICD-10-CM | POA: Diagnosis not present

## 2016-05-23 DIAGNOSIS — M5412 Radiculopathy, cervical region: Secondary | ICD-10-CM | POA: Diagnosis not present

## 2016-05-26 DIAGNOSIS — Z01810 Encounter for preprocedural cardiovascular examination: Secondary | ICD-10-CM | POA: Diagnosis not present

## 2016-05-26 DIAGNOSIS — M79603 Pain in arm, unspecified: Secondary | ICD-10-CM | POA: Diagnosis not present

## 2016-05-26 DIAGNOSIS — M79602 Pain in left arm: Secondary | ICD-10-CM | POA: Diagnosis not present

## 2016-05-26 DIAGNOSIS — R52 Pain, unspecified: Secondary | ICD-10-CM | POA: Diagnosis not present

## 2016-05-26 DIAGNOSIS — R05 Cough: Secondary | ICD-10-CM | POA: Diagnosis not present

## 2016-05-26 DIAGNOSIS — M5412 Radiculopathy, cervical region: Secondary | ICD-10-CM | POA: Diagnosis not present

## 2016-05-26 DIAGNOSIS — E559 Vitamin D deficiency, unspecified: Secondary | ICD-10-CM | POA: Diagnosis not present

## 2016-05-26 DIAGNOSIS — M5020 Other cervical disc displacement, unspecified cervical region: Secondary | ICD-10-CM | POA: Diagnosis not present

## 2016-05-26 DIAGNOSIS — Z79899 Other long term (current) drug therapy: Secondary | ICD-10-CM | POA: Diagnosis not present

## 2016-05-26 DIAGNOSIS — Z01818 Encounter for other preprocedural examination: Secondary | ICD-10-CM | POA: Diagnosis not present

## 2016-06-03 DIAGNOSIS — M1990 Unspecified osteoarthritis, unspecified site: Secondary | ICD-10-CM | POA: Diagnosis not present

## 2016-06-03 DIAGNOSIS — M5136 Other intervertebral disc degeneration, lumbar region: Secondary | ICD-10-CM | POA: Diagnosis not present

## 2016-06-03 DIAGNOSIS — M50222 Other cervical disc displacement at C5-C6 level: Secondary | ICD-10-CM | POA: Diagnosis not present

## 2016-06-03 DIAGNOSIS — G4733 Obstructive sleep apnea (adult) (pediatric): Secondary | ICD-10-CM | POA: Diagnosis not present

## 2016-06-03 DIAGNOSIS — E039 Hypothyroidism, unspecified: Secondary | ICD-10-CM | POA: Diagnosis not present

## 2016-06-03 DIAGNOSIS — M5412 Radiculopathy, cervical region: Secondary | ICD-10-CM | POA: Diagnosis not present

## 2016-06-03 DIAGNOSIS — M542 Cervicalgia: Secondary | ICD-10-CM | POA: Diagnosis not present

## 2016-06-03 DIAGNOSIS — M50123 Cervical disc disorder at C6-C7 level with radiculopathy: Secondary | ICD-10-CM | POA: Diagnosis not present

## 2016-06-03 DIAGNOSIS — Z87891 Personal history of nicotine dependence: Secondary | ICD-10-CM | POA: Diagnosis not present

## 2016-06-03 DIAGNOSIS — I10 Essential (primary) hypertension: Secondary | ICD-10-CM | POA: Diagnosis not present

## 2016-06-03 DIAGNOSIS — M5020 Other cervical disc displacement, unspecified cervical region: Secondary | ICD-10-CM | POA: Diagnosis not present

## 2016-06-03 DIAGNOSIS — J449 Chronic obstructive pulmonary disease, unspecified: Secondary | ICD-10-CM | POA: Diagnosis not present

## 2016-06-04 DIAGNOSIS — M50123 Cervical disc disorder at C6-C7 level with radiculopathy: Secondary | ICD-10-CM | POA: Diagnosis not present

## 2016-06-04 DIAGNOSIS — M5412 Radiculopathy, cervical region: Secondary | ICD-10-CM | POA: Diagnosis not present

## 2016-06-04 DIAGNOSIS — M1990 Unspecified osteoarthritis, unspecified site: Secondary | ICD-10-CM | POA: Diagnosis not present

## 2016-06-04 DIAGNOSIS — E039 Hypothyroidism, unspecified: Secondary | ICD-10-CM | POA: Diagnosis not present

## 2016-06-04 DIAGNOSIS — I10 Essential (primary) hypertension: Secondary | ICD-10-CM | POA: Diagnosis not present

## 2016-06-04 DIAGNOSIS — M5020 Other cervical disc displacement, unspecified cervical region: Secondary | ICD-10-CM | POA: Diagnosis not present

## 2016-06-10 DIAGNOSIS — Z048 Encounter for examination and observation for other specified reasons: Secondary | ICD-10-CM | POA: Diagnosis not present

## 2016-06-10 DIAGNOSIS — S1190XA Unspecified open wound of unspecified part of neck, initial encounter: Secondary | ICD-10-CM | POA: Diagnosis not present

## 2016-06-10 DIAGNOSIS — Z4801 Encounter for change or removal of surgical wound dressing: Secondary | ICD-10-CM | POA: Diagnosis not present

## 2016-06-10 DIAGNOSIS — Z9889 Other specified postprocedural states: Secondary | ICD-10-CM | POA: Diagnosis not present

## 2016-07-07 DIAGNOSIS — E559 Vitamin D deficiency, unspecified: Secondary | ICD-10-CM | POA: Diagnosis not present

## 2016-07-07 DIAGNOSIS — E1165 Type 2 diabetes mellitus with hyperglycemia: Secondary | ICD-10-CM | POA: Diagnosis not present

## 2016-07-07 DIAGNOSIS — I10 Essential (primary) hypertension: Secondary | ICD-10-CM | POA: Diagnosis not present

## 2016-07-07 DIAGNOSIS — Z9119 Patient's noncompliance with other medical treatment and regimen: Secondary | ICD-10-CM | POA: Diagnosis not present

## 2016-07-07 DIAGNOSIS — Z0189 Encounter for other specified special examinations: Secondary | ICD-10-CM | POA: Diagnosis not present

## 2016-07-07 DIAGNOSIS — R42 Dizziness and giddiness: Secondary | ICD-10-CM | POA: Diagnosis not present

## 2016-07-07 DIAGNOSIS — E039 Hypothyroidism, unspecified: Secondary | ICD-10-CM | POA: Diagnosis not present

## 2016-07-07 DIAGNOSIS — Z130 Encounter for screening for diseases of the blood and blood-forming organs and certain disorders involving the immune mechanism: Secondary | ICD-10-CM | POA: Diagnosis not present

## 2016-07-07 DIAGNOSIS — E790 Hyperuricemia without signs of inflammatory arthritis and tophaceous disease: Secondary | ICD-10-CM | POA: Diagnosis not present

## 2016-07-07 DIAGNOSIS — E782 Mixed hyperlipidemia: Secondary | ICD-10-CM | POA: Diagnosis not present

## 2016-07-07 DIAGNOSIS — M961 Postlaminectomy syndrome, not elsewhere classified: Secondary | ICD-10-CM | POA: Diagnosis not present

## 2016-07-13 DIAGNOSIS — Z1231 Encounter for screening mammogram for malignant neoplasm of breast: Secondary | ICD-10-CM | POA: Diagnosis not present

## 2016-07-13 DIAGNOSIS — R42 Dizziness and giddiness: Secondary | ICD-10-CM | POA: Diagnosis not present

## 2016-07-13 DIAGNOSIS — F410 Panic disorder [episodic paroxysmal anxiety] without agoraphobia: Secondary | ICD-10-CM | POA: Diagnosis not present

## 2016-07-20 DIAGNOSIS — E039 Hypothyroidism, unspecified: Secondary | ICD-10-CM | POA: Diagnosis not present

## 2016-07-20 DIAGNOSIS — Z9119 Patient's noncompliance with other medical treatment and regimen: Secondary | ICD-10-CM | POA: Diagnosis not present

## 2016-07-20 DIAGNOSIS — I10 Essential (primary) hypertension: Secondary | ICD-10-CM | POA: Diagnosis not present

## 2016-07-20 DIAGNOSIS — E785 Hyperlipidemia, unspecified: Secondary | ICD-10-CM | POA: Diagnosis not present

## 2016-07-20 DIAGNOSIS — E559 Vitamin D deficiency, unspecified: Secondary | ICD-10-CM | POA: Diagnosis not present

## 2016-07-27 DIAGNOSIS — E039 Hypothyroidism, unspecified: Secondary | ICD-10-CM | POA: Diagnosis not present

## 2016-07-27 DIAGNOSIS — Z23 Encounter for immunization: Secondary | ICD-10-CM | POA: Diagnosis not present

## 2016-09-07 DIAGNOSIS — F410 Panic disorder [episodic paroxysmal anxiety] without agoraphobia: Secondary | ICD-10-CM | POA: Diagnosis not present

## 2016-09-21 DIAGNOSIS — Z1212 Encounter for screening for malignant neoplasm of rectum: Secondary | ICD-10-CM | POA: Diagnosis not present

## 2016-09-21 DIAGNOSIS — M5412 Radiculopathy, cervical region: Secondary | ICD-10-CM | POA: Diagnosis not present

## 2016-09-21 DIAGNOSIS — Z1211 Encounter for screening for malignant neoplasm of colon: Secondary | ICD-10-CM | POA: Diagnosis not present

## 2016-10-05 DIAGNOSIS — R05 Cough: Secondary | ICD-10-CM | POA: Diagnosis not present

## 2016-10-05 DIAGNOSIS — J019 Acute sinusitis, unspecified: Secondary | ICD-10-CM | POA: Diagnosis not present

## 2016-10-05 DIAGNOSIS — I10 Essential (primary) hypertension: Secondary | ICD-10-CM | POA: Diagnosis not present

## 2016-10-05 DIAGNOSIS — F332 Major depressive disorder, recurrent severe without psychotic features: Secondary | ICD-10-CM | POA: Diagnosis not present

## 2016-10-05 DIAGNOSIS — R5381 Other malaise: Secondary | ICD-10-CM | POA: Diagnosis not present

## 2016-10-07 DIAGNOSIS — R0600 Dyspnea, unspecified: Secondary | ICD-10-CM | POA: Diagnosis not present

## 2016-11-25 DIAGNOSIS — R0602 Shortness of breath: Secondary | ICD-10-CM | POA: Diagnosis not present

## 2016-11-25 DIAGNOSIS — J209 Acute bronchitis, unspecified: Secondary | ICD-10-CM | POA: Diagnosis not present

## 2016-11-29 DIAGNOSIS — M961 Postlaminectomy syndrome, not elsewhere classified: Secondary | ICD-10-CM | POA: Diagnosis not present

## 2016-11-29 DIAGNOSIS — R05 Cough: Secondary | ICD-10-CM | POA: Diagnosis not present

## 2016-11-29 DIAGNOSIS — E782 Mixed hyperlipidemia: Secondary | ICD-10-CM | POA: Diagnosis not present

## 2016-11-29 DIAGNOSIS — E559 Vitamin D deficiency, unspecified: Secondary | ICD-10-CM | POA: Diagnosis not present

## 2016-11-29 DIAGNOSIS — E1165 Type 2 diabetes mellitus with hyperglycemia: Secondary | ICD-10-CM | POA: Diagnosis not present

## 2016-11-29 DIAGNOSIS — E790 Hyperuricemia without signs of inflammatory arthritis and tophaceous disease: Secondary | ICD-10-CM | POA: Diagnosis not present

## 2016-11-29 DIAGNOSIS — E6609 Other obesity due to excess calories: Secondary | ICD-10-CM | POA: Diagnosis not present

## 2016-11-29 DIAGNOSIS — M542 Cervicalgia: Secondary | ICD-10-CM | POA: Diagnosis not present

## 2016-11-29 DIAGNOSIS — Z713 Dietary counseling and surveillance: Secondary | ICD-10-CM | POA: Diagnosis not present

## 2016-11-29 DIAGNOSIS — I10 Essential (primary) hypertension: Secondary | ICD-10-CM | POA: Diagnosis not present

## 2016-11-29 DIAGNOSIS — E039 Hypothyroidism, unspecified: Secondary | ICD-10-CM | POA: Diagnosis not present

## 2016-12-06 DIAGNOSIS — R079 Chest pain, unspecified: Secondary | ICD-10-CM | POA: Diagnosis not present

## 2016-12-06 DIAGNOSIS — J209 Acute bronchitis, unspecified: Secondary | ICD-10-CM | POA: Diagnosis not present

## 2016-12-06 DIAGNOSIS — R05 Cough: Secondary | ICD-10-CM | POA: Diagnosis not present

## 2016-12-14 DIAGNOSIS — F410 Panic disorder [episodic paroxysmal anxiety] without agoraphobia: Secondary | ICD-10-CM | POA: Diagnosis not present

## 2016-12-22 DIAGNOSIS — M5412 Radiculopathy, cervical region: Secondary | ICD-10-CM | POA: Diagnosis not present

## 2016-12-22 DIAGNOSIS — J069 Acute upper respiratory infection, unspecified: Secondary | ICD-10-CM | POA: Diagnosis not present

## 2016-12-22 DIAGNOSIS — R05 Cough: Secondary | ICD-10-CM | POA: Diagnosis not present

## 2016-12-22 DIAGNOSIS — E039 Hypothyroidism, unspecified: Secondary | ICD-10-CM | POA: Diagnosis not present

## 2016-12-22 DIAGNOSIS — I10 Essential (primary) hypertension: Secondary | ICD-10-CM | POA: Diagnosis not present

## 2016-12-22 DIAGNOSIS — G629 Polyneuropathy, unspecified: Secondary | ICD-10-CM | POA: Diagnosis not present

## 2016-12-22 DIAGNOSIS — B3789 Other sites of candidiasis: Secondary | ICD-10-CM | POA: Diagnosis not present

## 2016-12-26 IMAGING — CT CT CHEST W/O CM
3 of 4 series · 16 of 30 positions shown, 18 images · non-contrast
Comparison: Chest CT 10/24/2014.

CLINICAL DATA: 59-year-old female with history of lung cancer
status post left upper lobectomy on 11/28/2013. Followup study.

EXAM:
CT CHEST WITHOUT CONTRAST
TECHNIQUE: Multidetector CT imaging of the chest was performed following the
standard protocol without IV contrast.

[Series 3: chest w/o · axial · non-contrast · 0.65mm/px · z∈[-206,-26]mm · 4 of 62 slices shown]
[im 13/62  lung]
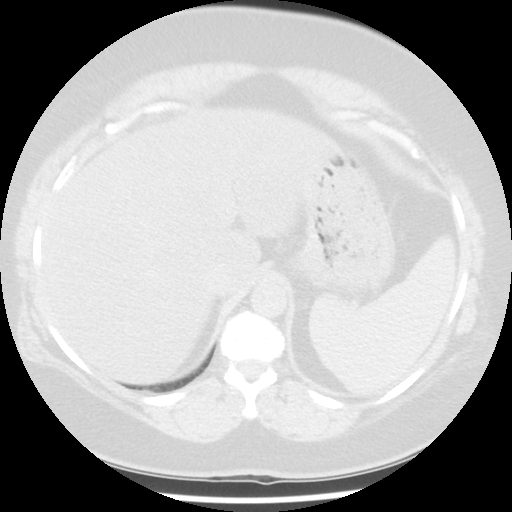
[im 25/62  lung]
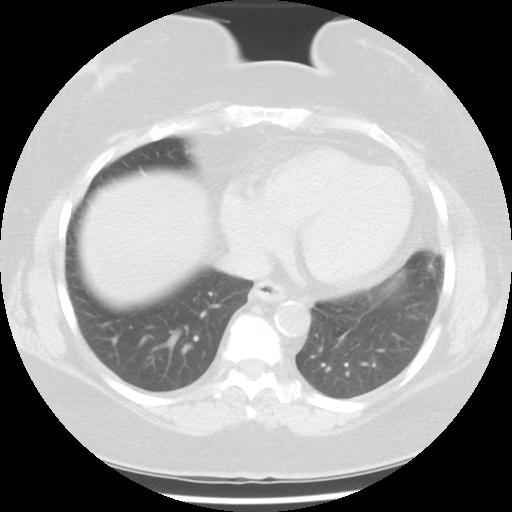
[im 37/62  lung]
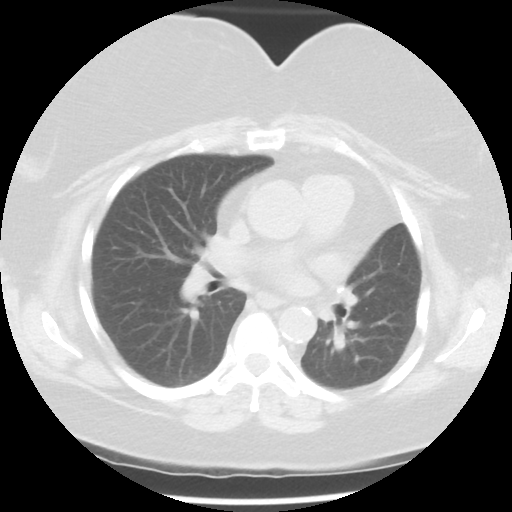
[im 49/62  lung]
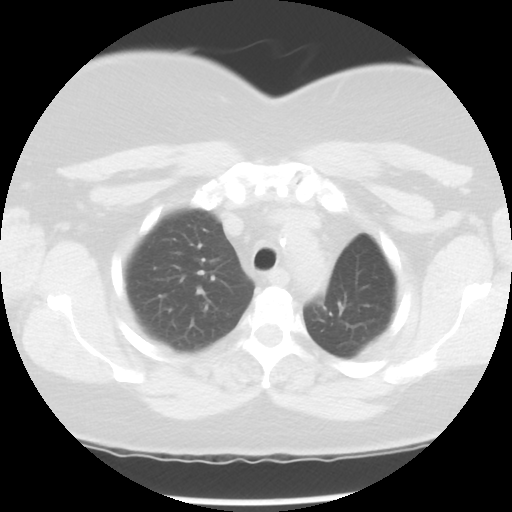

[Series 4: lung windows · axial · 0.65mm/px · z∈[-216,-16]mm · 5 of 62 slices shown, 7 images]
[im 11/62  mediastinal]
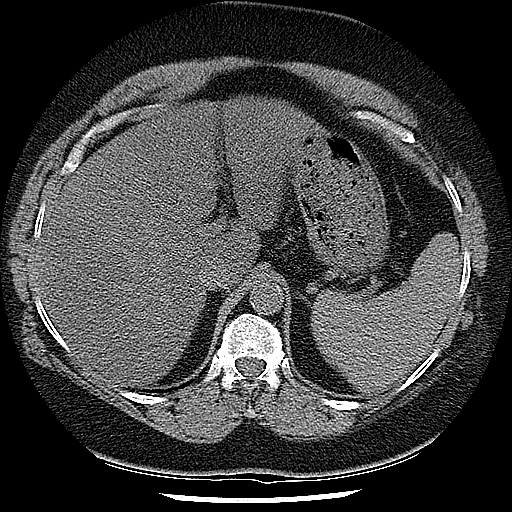
[im 11/62  lung]
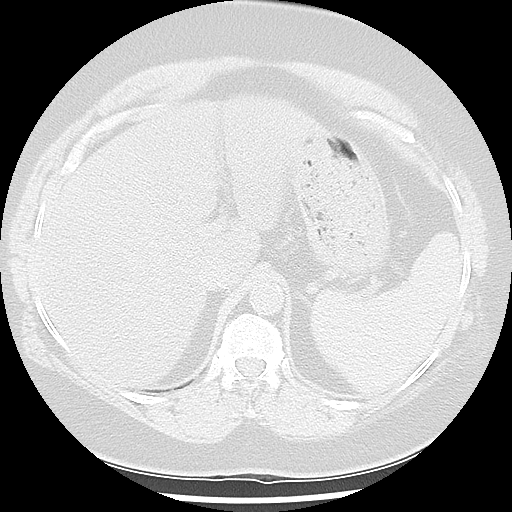
[im 21/62  lung]
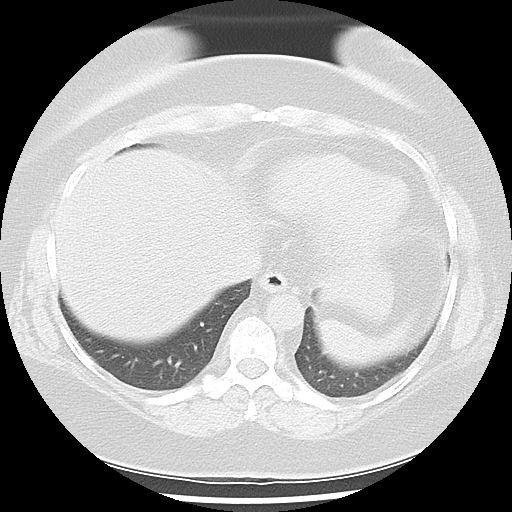
[im 31/62  lung]
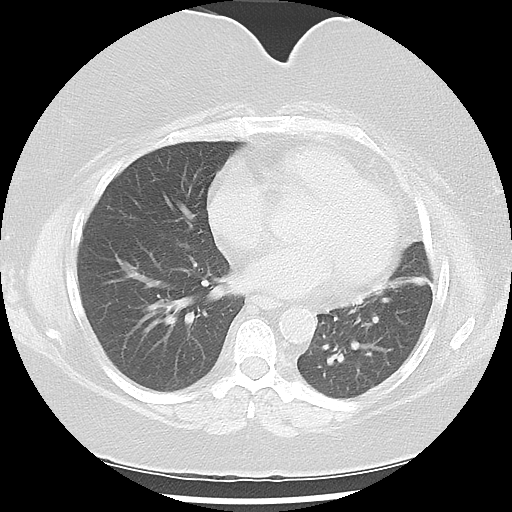
[im 41/62  lung]
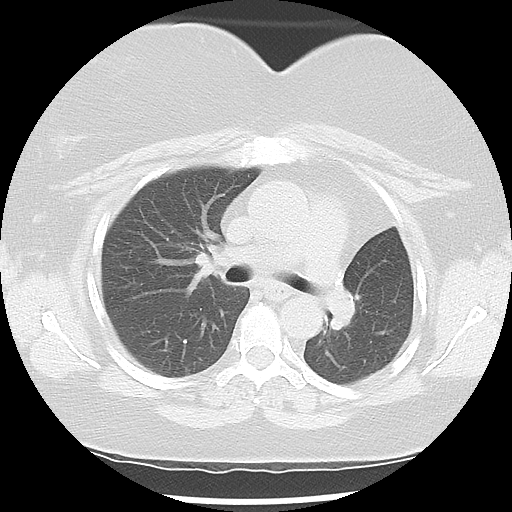
[im 51/62  mediastinal]
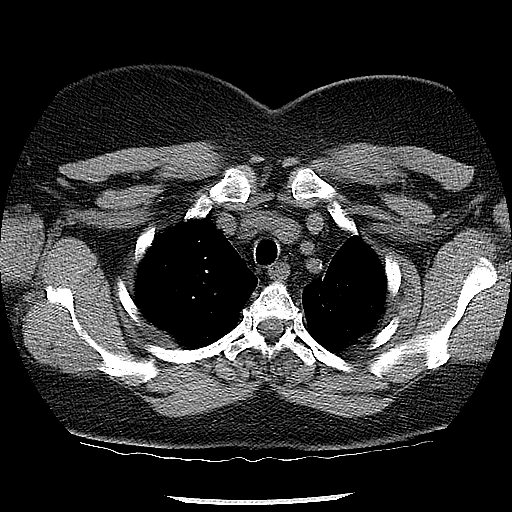
[im 51/62  lung]
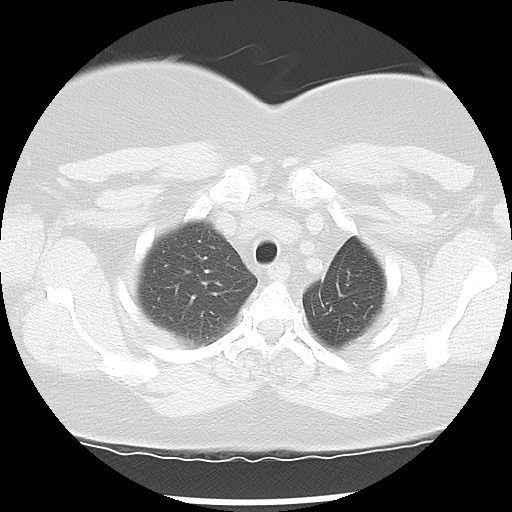

[Series 602: sagittal body · sagittal · 0.65mm/px · 7 of 134 slices shown]
[im 10/134  mediastinal]
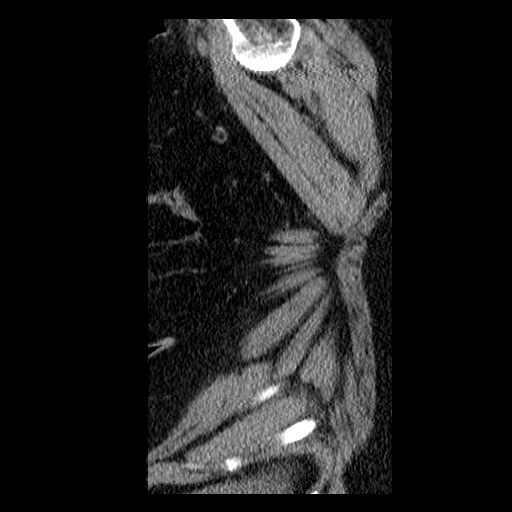
[im 29/134  mediastinal]
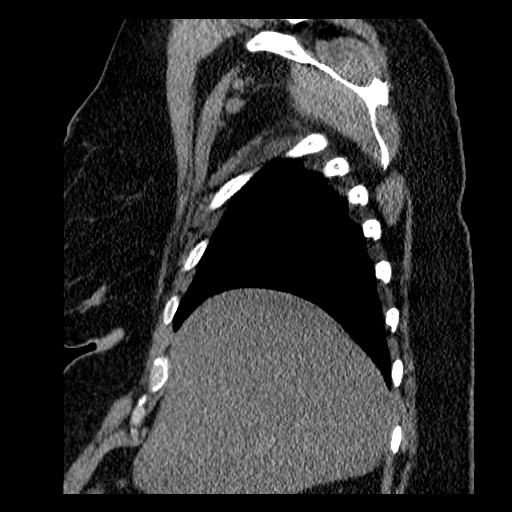
[im 48/134  mediastinal]
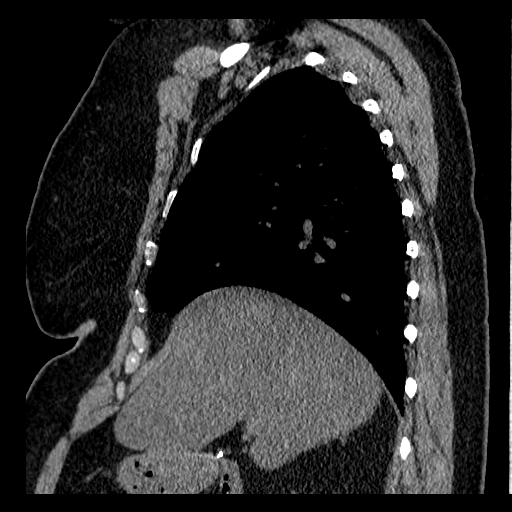
[im 58/134  mediastinal]
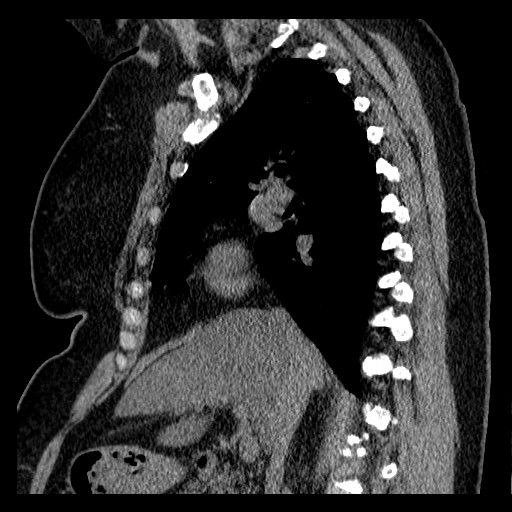
[im 77/134  mediastinal]
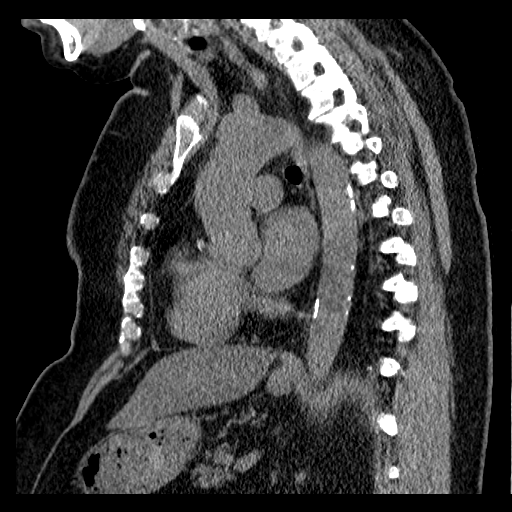
[im 86/134  mediastinal]
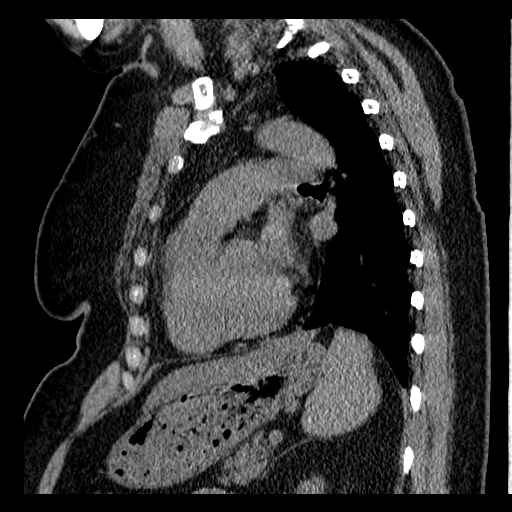
[im 105/134  mediastinal]
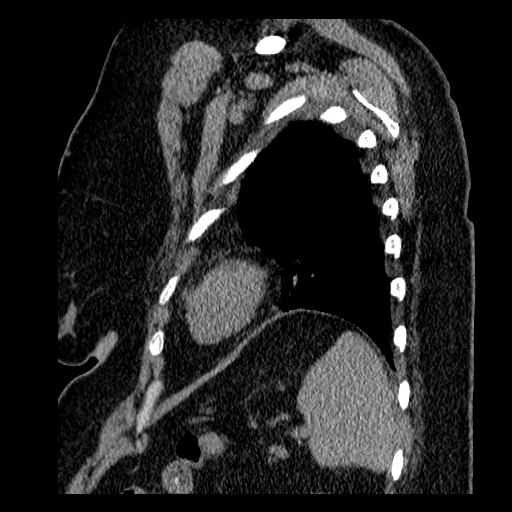

[16 of 30 positions shown; findings below may reference images not displayed]

FINDINGS: Mediastinum/Lymph Nodes: Heart size is normal. There is no
significant pericardial fluid, thickening or pericardial
calcification. There is atherosclerosis of the thoracic aorta, the
great vessels of the mediastinum and the coronary arteries,
including calcified atherosclerotic plaque in the left anterior
descending and right coronary arteries. No pathologically enlarged
mediastinal or hilar lymph nodes. Please note that accurate
exclusion of hilar adenopathy is limited on noncontrast CT scans.
Esophagus is unremarkable in appearance. No axillary
lymphadenopathy.

Lungs/Pleura: Status post left upper lobectomy. Compensatory
hyperexpansion of the left lower lobe. Mild linear scarring in the
basal segments of the left lower lobe anteriorly, unchanged. No
suspicious appearing pulmonary nodules or masses. No pleural
nodularity or pleural effusion. No acute consolidative airspace
disease.

Upper Abdomen: Diffuse low attenuation throughout the hepatic
parenchyma, compatible with hepatic steatosis. Status post
cholecystectomy.

Musculoskeletal/Soft Tissues: There are no aggressive appearing
lytic or blastic lesions noted in the visualized portions of the
skeleton.
IMPRESSION: 1. Status post left upper lobectomy. No findings to suggest local
recurrence of disease or metastatic disease in the thorax.
2. Atherosclerosis, including 2 vessel coronary artery disease.
Please note that although the presence of coronary artery calcium
documents the presence of coronary artery disease, the severity of
this disease and any potential stenosis cannot be assessed on this
non-gated CT examination. Assessment for potential risk factor
modification, dietary therapy or pharmacologic therapy may be
warranted, if clinically indicated.
3. Hepatic steatosis.

## 2017-01-11 DIAGNOSIS — F419 Anxiety disorder, unspecified: Secondary | ICD-10-CM | POA: Diagnosis not present

## 2017-01-11 DIAGNOSIS — R0602 Shortness of breath: Secondary | ICD-10-CM | POA: Diagnosis not present

## 2017-01-11 DIAGNOSIS — R0600 Dyspnea, unspecified: Secondary | ICD-10-CM | POA: Diagnosis not present

## 2017-01-31 DIAGNOSIS — M5431 Sciatica, right side: Secondary | ICD-10-CM | POA: Diagnosis not present

## 2017-01-31 DIAGNOSIS — E6609 Other obesity due to excess calories: Secondary | ICD-10-CM | POA: Diagnosis not present

## 2017-01-31 DIAGNOSIS — M5432 Sciatica, left side: Secondary | ICD-10-CM | POA: Diagnosis not present

## 2017-01-31 DIAGNOSIS — F332 Major depressive disorder, recurrent severe without psychotic features: Secondary | ICD-10-CM | POA: Diagnosis not present

## 2017-01-31 DIAGNOSIS — I10 Essential (primary) hypertension: Secondary | ICD-10-CM | POA: Diagnosis not present

## 2017-02-09 DIAGNOSIS — R05 Cough: Secondary | ICD-10-CM | POA: Diagnosis not present

## 2017-02-09 DIAGNOSIS — J302 Other seasonal allergic rhinitis: Secondary | ICD-10-CM | POA: Diagnosis not present

## 2017-02-09 DIAGNOSIS — J069 Acute upper respiratory infection, unspecified: Secondary | ICD-10-CM | POA: Diagnosis not present

## 2017-02-09 DIAGNOSIS — B372 Candidiasis of skin and nail: Secondary | ICD-10-CM | POA: Diagnosis not present

## 2017-02-16 DIAGNOSIS — R112 Nausea with vomiting, unspecified: Secondary | ICD-10-CM | POA: Diagnosis not present

## 2017-03-15 DIAGNOSIS — F410 Panic disorder [episodic paroxysmal anxiety] without agoraphobia: Secondary | ICD-10-CM | POA: Diagnosis not present

## 2017-04-07 DIAGNOSIS — Z130 Encounter for screening for diseases of the blood and blood-forming organs and certain disorders involving the immune mechanism: Secondary | ICD-10-CM | POA: Diagnosis not present

## 2017-04-07 DIAGNOSIS — E782 Mixed hyperlipidemia: Secondary | ICD-10-CM | POA: Diagnosis not present

## 2017-04-07 DIAGNOSIS — E1165 Type 2 diabetes mellitus with hyperglycemia: Secondary | ICD-10-CM | POA: Diagnosis not present

## 2017-04-07 DIAGNOSIS — R51 Headache: Secondary | ICD-10-CM | POA: Diagnosis not present

## 2017-04-07 DIAGNOSIS — R110 Nausea: Secondary | ICD-10-CM | POA: Diagnosis not present

## 2017-04-07 DIAGNOSIS — Z0189 Encounter for other specified special examinations: Secondary | ICD-10-CM | POA: Diagnosis not present

## 2017-04-07 DIAGNOSIS — E559 Vitamin D deficiency, unspecified: Secondary | ICD-10-CM | POA: Diagnosis not present

## 2017-04-07 DIAGNOSIS — E790 Hyperuricemia without signs of inflammatory arthritis and tophaceous disease: Secondary | ICD-10-CM | POA: Diagnosis not present

## 2017-04-07 DIAGNOSIS — E039 Hypothyroidism, unspecified: Secondary | ICD-10-CM | POA: Diagnosis not present

## 2017-04-07 DIAGNOSIS — I10 Essential (primary) hypertension: Secondary | ICD-10-CM | POA: Diagnosis not present

## 2017-04-08 DIAGNOSIS — S199XXA Unspecified injury of neck, initial encounter: Secondary | ICD-10-CM | POA: Diagnosis not present

## 2017-04-08 DIAGNOSIS — S80211A Abrasion, right knee, initial encounter: Secondary | ICD-10-CM | POA: Diagnosis not present

## 2017-04-08 DIAGNOSIS — S060X0A Concussion without loss of consciousness, initial encounter: Secondary | ICD-10-CM | POA: Diagnosis not present

## 2017-04-08 DIAGNOSIS — S0990XA Unspecified injury of head, initial encounter: Secondary | ICD-10-CM | POA: Diagnosis not present

## 2017-04-08 DIAGNOSIS — M25561 Pain in right knee: Secondary | ICD-10-CM | POA: Diagnosis not present

## 2017-05-12 DIAGNOSIS — Z8041 Family history of malignant neoplasm of ovary: Secondary | ICD-10-CM | POA: Diagnosis not present

## 2017-05-12 DIAGNOSIS — E6609 Other obesity due to excess calories: Secondary | ICD-10-CM | POA: Diagnosis not present

## 2017-05-12 DIAGNOSIS — Z1379 Encounter for other screening for genetic and chromosomal anomalies: Secondary | ICD-10-CM | POA: Diagnosis not present

## 2017-05-12 DIAGNOSIS — K219 Gastro-esophageal reflux disease without esophagitis: Secondary | ICD-10-CM | POA: Diagnosis not present

## 2017-05-12 DIAGNOSIS — I10 Essential (primary) hypertension: Secondary | ICD-10-CM | POA: Diagnosis not present

## 2017-05-12 DIAGNOSIS — Z1389 Encounter for screening for other disorder: Secondary | ICD-10-CM | POA: Diagnosis not present

## 2017-05-12 DIAGNOSIS — Z86010 Personal history of colonic polyps: Secondary | ICD-10-CM | POA: Diagnosis not present

## 2017-05-12 DIAGNOSIS — F329 Major depressive disorder, single episode, unspecified: Secondary | ICD-10-CM | POA: Diagnosis not present

## 2017-05-12 DIAGNOSIS — E559 Vitamin D deficiency, unspecified: Secondary | ICD-10-CM | POA: Diagnosis not present

## 2017-05-12 DIAGNOSIS — Z1509 Genetic susceptibility to other malignant neoplasm: Secondary | ICD-10-CM | POA: Diagnosis not present

## 2017-05-13 DIAGNOSIS — F3160 Bipolar disorder, current episode mixed, unspecified: Secondary | ICD-10-CM | POA: Diagnosis not present

## 2017-05-13 DIAGNOSIS — M79609 Pain in unspecified limb: Secondary | ICD-10-CM | POA: Diagnosis not present

## 2017-05-13 DIAGNOSIS — F331 Major depressive disorder, recurrent, moderate: Secondary | ICD-10-CM | POA: Diagnosis not present

## 2017-05-13 DIAGNOSIS — Z1379 Encounter for other screening for genetic and chromosomal anomalies: Secondary | ICD-10-CM | POA: Diagnosis not present

## 2017-05-13 DIAGNOSIS — F419 Anxiety disorder, unspecified: Secondary | ICD-10-CM | POA: Diagnosis not present

## 2017-05-13 DIAGNOSIS — F339 Major depressive disorder, recurrent, unspecified: Secondary | ICD-10-CM | POA: Diagnosis not present

## 2017-05-13 DIAGNOSIS — I10 Essential (primary) hypertension: Secondary | ICD-10-CM | POA: Diagnosis not present

## 2017-06-14 DIAGNOSIS — F410 Panic disorder [episodic paroxysmal anxiety] without agoraphobia: Secondary | ICD-10-CM | POA: Diagnosis not present

## 2017-06-15 DIAGNOSIS — N760 Acute vaginitis: Secondary | ICD-10-CM | POA: Diagnosis not present

## 2017-06-15 DIAGNOSIS — I10 Essential (primary) hypertension: Secondary | ICD-10-CM | POA: Diagnosis not present

## 2017-06-15 DIAGNOSIS — Z01419 Encounter for gynecological examination (general) (routine) without abnormal findings: Secondary | ICD-10-CM | POA: Diagnosis not present

## 2017-06-15 DIAGNOSIS — Z124 Encounter for screening for malignant neoplasm of cervix: Secondary | ICD-10-CM | POA: Diagnosis not present

## 2017-06-15 DIAGNOSIS — E6609 Other obesity due to excess calories: Secondary | ICD-10-CM | POA: Diagnosis not present

## 2017-07-19 DIAGNOSIS — H25813 Combined forms of age-related cataract, bilateral: Secondary | ICD-10-CM | POA: Diagnosis not present

## 2017-07-19 DIAGNOSIS — H524 Presbyopia: Secondary | ICD-10-CM | POA: Diagnosis not present

## 2017-07-19 DIAGNOSIS — H43313 Vitreous membranes and strands, bilateral: Secondary | ICD-10-CM | POA: Diagnosis not present

## 2017-07-19 DIAGNOSIS — I10 Essential (primary) hypertension: Secondary | ICD-10-CM | POA: Diagnosis not present

## 2017-07-19 DIAGNOSIS — H52223 Regular astigmatism, bilateral: Secondary | ICD-10-CM | POA: Diagnosis not present

## 2017-07-19 DIAGNOSIS — H5203 Hypermetropia, bilateral: Secondary | ICD-10-CM | POA: Diagnosis not present

## 2017-07-19 DIAGNOSIS — H43813 Vitreous degeneration, bilateral: Secondary | ICD-10-CM | POA: Diagnosis not present

## 2017-08-03 DIAGNOSIS — Z1231 Encounter for screening mammogram for malignant neoplasm of breast: Secondary | ICD-10-CM | POA: Diagnosis not present

## 2017-08-12 DIAGNOSIS — Z23 Encounter for immunization: Secondary | ICD-10-CM | POA: Diagnosis not present

## 2017-08-15 DIAGNOSIS — E6609 Other obesity due to excess calories: Secondary | ICD-10-CM | POA: Diagnosis not present

## 2017-08-15 DIAGNOSIS — I10 Essential (primary) hypertension: Secondary | ICD-10-CM | POA: Diagnosis not present

## 2017-08-15 DIAGNOSIS — M2550 Pain in unspecified joint: Secondary | ICD-10-CM | POA: Diagnosis not present

## 2017-08-15 DIAGNOSIS — E559 Vitamin D deficiency, unspecified: Secondary | ICD-10-CM | POA: Diagnosis not present

## 2017-08-15 DIAGNOSIS — E889 Metabolic disorder, unspecified: Secondary | ICD-10-CM | POA: Diagnosis not present

## 2017-08-15 DIAGNOSIS — E039 Hypothyroidism, unspecified: Secondary | ICD-10-CM | POA: Diagnosis not present

## 2017-08-15 DIAGNOSIS — E782 Mixed hyperlipidemia: Secondary | ICD-10-CM | POA: Diagnosis not present

## 2017-08-15 DIAGNOSIS — E1165 Type 2 diabetes mellitus with hyperglycemia: Secondary | ICD-10-CM | POA: Diagnosis not present

## 2017-08-15 DIAGNOSIS — R52 Pain, unspecified: Secondary | ICD-10-CM | POA: Diagnosis not present

## 2017-08-15 DIAGNOSIS — E790 Hyperuricemia without signs of inflammatory arthritis and tophaceous disease: Secondary | ICD-10-CM | POA: Diagnosis not present

## 2017-09-01 DIAGNOSIS — M9903 Segmental and somatic dysfunction of lumbar region: Secondary | ICD-10-CM | POA: Diagnosis not present

## 2017-09-01 DIAGNOSIS — M5136 Other intervertebral disc degeneration, lumbar region: Secondary | ICD-10-CM | POA: Diagnosis not present

## 2017-09-01 DIAGNOSIS — M9902 Segmental and somatic dysfunction of thoracic region: Secondary | ICD-10-CM | POA: Diagnosis not present

## 2017-09-01 DIAGNOSIS — M5442 Lumbago with sciatica, left side: Secondary | ICD-10-CM | POA: Diagnosis not present

## 2017-09-01 DIAGNOSIS — M9904 Segmental and somatic dysfunction of sacral region: Secondary | ICD-10-CM | POA: Diagnosis not present

## 2017-09-02 DIAGNOSIS — M9902 Segmental and somatic dysfunction of thoracic region: Secondary | ICD-10-CM | POA: Diagnosis not present

## 2017-09-02 DIAGNOSIS — M5136 Other intervertebral disc degeneration, lumbar region: Secondary | ICD-10-CM | POA: Diagnosis not present

## 2017-09-02 DIAGNOSIS — M5442 Lumbago with sciatica, left side: Secondary | ICD-10-CM | POA: Diagnosis not present

## 2017-09-02 DIAGNOSIS — M9904 Segmental and somatic dysfunction of sacral region: Secondary | ICD-10-CM | POA: Diagnosis not present

## 2017-09-02 DIAGNOSIS — M9903 Segmental and somatic dysfunction of lumbar region: Secondary | ICD-10-CM | POA: Diagnosis not present

## 2017-09-05 DIAGNOSIS — M9904 Segmental and somatic dysfunction of sacral region: Secondary | ICD-10-CM | POA: Diagnosis not present

## 2017-09-05 DIAGNOSIS — M5442 Lumbago with sciatica, left side: Secondary | ICD-10-CM | POA: Diagnosis not present

## 2017-09-05 DIAGNOSIS — M5136 Other intervertebral disc degeneration, lumbar region: Secondary | ICD-10-CM | POA: Diagnosis not present

## 2017-09-05 DIAGNOSIS — M9902 Segmental and somatic dysfunction of thoracic region: Secondary | ICD-10-CM | POA: Diagnosis not present

## 2017-09-05 DIAGNOSIS — M9903 Segmental and somatic dysfunction of lumbar region: Secondary | ICD-10-CM | POA: Diagnosis not present

## 2017-09-21 DIAGNOSIS — M5412 Radiculopathy, cervical region: Secondary | ICD-10-CM | POA: Diagnosis not present

## 2017-09-28 DIAGNOSIS — H9202 Otalgia, left ear: Secondary | ICD-10-CM | POA: Diagnosis not present

## 2017-09-28 DIAGNOSIS — R110 Nausea: Secondary | ICD-10-CM | POA: Diagnosis not present

## 2017-09-28 DIAGNOSIS — I10 Essential (primary) hypertension: Secondary | ICD-10-CM | POA: Diagnosis not present

## 2017-09-28 DIAGNOSIS — R197 Diarrhea, unspecified: Secondary | ICD-10-CM | POA: Diagnosis not present

## 2017-09-28 DIAGNOSIS — R1110 Vomiting, unspecified: Secondary | ICD-10-CM | POA: Diagnosis not present

## 2017-10-11 DIAGNOSIS — F331 Major depressive disorder, recurrent, moderate: Secondary | ICD-10-CM | POA: Diagnosis not present

## 2017-10-11 DIAGNOSIS — F410 Panic disorder [episodic paroxysmal anxiety] without agoraphobia: Secondary | ICD-10-CM | POA: Diagnosis not present

## 2017-11-11 DIAGNOSIS — F410 Panic disorder [episodic paroxysmal anxiety] without agoraphobia: Secondary | ICD-10-CM | POA: Diagnosis not present

## 2017-11-11 DIAGNOSIS — F4310 Post-traumatic stress disorder, unspecified: Secondary | ICD-10-CM | POA: Diagnosis not present

## 2017-11-24 DIAGNOSIS — M5412 Radiculopathy, cervical region: Secondary | ICD-10-CM | POA: Diagnosis not present

## 2017-11-29 DIAGNOSIS — Z0189 Encounter for other specified special examinations: Secondary | ICD-10-CM | POA: Diagnosis not present

## 2017-11-29 DIAGNOSIS — E782 Mixed hyperlipidemia: Secondary | ICD-10-CM | POA: Diagnosis not present

## 2017-11-29 DIAGNOSIS — E559 Vitamin D deficiency, unspecified: Secondary | ICD-10-CM | POA: Diagnosis not present

## 2017-11-29 DIAGNOSIS — E790 Hyperuricemia without signs of inflammatory arthritis and tophaceous disease: Secondary | ICD-10-CM | POA: Diagnosis not present

## 2017-11-29 DIAGNOSIS — Z130 Encounter for screening for diseases of the blood and blood-forming organs and certain disorders involving the immune mechanism: Secondary | ICD-10-CM | POA: Diagnosis not present

## 2017-11-29 DIAGNOSIS — E1165 Type 2 diabetes mellitus with hyperglycemia: Secondary | ICD-10-CM | POA: Diagnosis not present

## 2017-11-29 DIAGNOSIS — E039 Hypothyroidism, unspecified: Secondary | ICD-10-CM | POA: Diagnosis not present

## 2017-11-29 DIAGNOSIS — I10 Essential (primary) hypertension: Secondary | ICD-10-CM | POA: Diagnosis not present

## 2017-11-29 DIAGNOSIS — E6609 Other obesity due to excess calories: Secondary | ICD-10-CM | POA: Diagnosis not present

## 2017-12-23 DIAGNOSIS — H43312 Vitreous membranes and strands, left eye: Secondary | ICD-10-CM | POA: Diagnosis not present

## 2017-12-23 DIAGNOSIS — H43812 Vitreous degeneration, left eye: Secondary | ICD-10-CM | POA: Diagnosis not present

## 2017-12-23 DIAGNOSIS — H43392 Other vitreous opacities, left eye: Secondary | ICD-10-CM | POA: Diagnosis not present

## 2018-01-11 DIAGNOSIS — M25511 Pain in right shoulder: Secondary | ICD-10-CM | POA: Diagnosis not present

## 2018-01-13 DIAGNOSIS — N3000 Acute cystitis without hematuria: Secondary | ICD-10-CM | POA: Diagnosis not present

## 2018-01-13 DIAGNOSIS — F980 Enuresis not due to a substance or known physiological condition: Secondary | ICD-10-CM | POA: Diagnosis not present

## 2018-01-13 DIAGNOSIS — N318 Other neuromuscular dysfunction of bladder: Secondary | ICD-10-CM | POA: Diagnosis not present

## 2018-01-23 ENCOUNTER — Encounter: Payer: Self-pay | Admitting: Gastroenterology

## 2018-01-31 DIAGNOSIS — N3020 Other chronic cystitis without hematuria: Secondary | ICD-10-CM | POA: Diagnosis not present

## 2018-01-31 DIAGNOSIS — N3281 Overactive bladder: Secondary | ICD-10-CM | POA: Diagnosis not present

## 2018-02-22 DIAGNOSIS — M25511 Pain in right shoulder: Secondary | ICD-10-CM | POA: Diagnosis not present

## 2018-03-01 DIAGNOSIS — E889 Metabolic disorder, unspecified: Secondary | ICD-10-CM | POA: Diagnosis not present

## 2018-03-01 DIAGNOSIS — I10 Essential (primary) hypertension: Secondary | ICD-10-CM | POA: Diagnosis not present

## 2018-03-01 DIAGNOSIS — E0590 Thyrotoxicosis, unspecified without thyrotoxic crisis or storm: Secondary | ICD-10-CM | POA: Diagnosis not present

## 2018-03-01 DIAGNOSIS — E039 Hypothyroidism, unspecified: Secondary | ICD-10-CM | POA: Diagnosis not present

## 2018-03-01 DIAGNOSIS — Z130 Encounter for screening for diseases of the blood and blood-forming organs and certain disorders involving the immune mechanism: Secondary | ICD-10-CM | POA: Diagnosis not present

## 2018-03-01 DIAGNOSIS — E559 Vitamin D deficiency, unspecified: Secondary | ICD-10-CM | POA: Diagnosis not present

## 2018-03-01 DIAGNOSIS — M961 Postlaminectomy syndrome, not elsewhere classified: Secondary | ICD-10-CM | POA: Diagnosis not present

## 2018-03-01 DIAGNOSIS — E6609 Other obesity due to excess calories: Secondary | ICD-10-CM | POA: Diagnosis not present

## 2018-03-09 DIAGNOSIS — F331 Major depressive disorder, recurrent, moderate: Secondary | ICD-10-CM | POA: Diagnosis not present

## 2018-05-01 DIAGNOSIS — R5383 Other fatigue: Secondary | ICD-10-CM | POA: Diagnosis not present

## 2018-05-01 DIAGNOSIS — E6609 Other obesity due to excess calories: Secondary | ICD-10-CM | POA: Diagnosis not present

## 2018-05-01 DIAGNOSIS — I10 Essential (primary) hypertension: Secondary | ICD-10-CM | POA: Diagnosis not present

## 2018-05-01 DIAGNOSIS — E0590 Thyrotoxicosis, unspecified without thyrotoxic crisis or storm: Secondary | ICD-10-CM | POA: Diagnosis not present

## 2018-05-01 DIAGNOSIS — E663 Overweight: Secondary | ICD-10-CM | POA: Diagnosis not present

## 2018-05-05 DIAGNOSIS — F331 Major depressive disorder, recurrent, moderate: Secondary | ICD-10-CM | POA: Diagnosis not present

## 2018-05-09 DIAGNOSIS — R5383 Other fatigue: Secondary | ICD-10-CM | POA: Diagnosis not present

## 2018-05-23 DIAGNOSIS — R5383 Other fatigue: Secondary | ICD-10-CM | POA: Diagnosis not present

## 2018-05-31 DIAGNOSIS — E6609 Other obesity due to excess calories: Secondary | ICD-10-CM | POA: Diagnosis not present

## 2018-05-31 DIAGNOSIS — Z713 Dietary counseling and surveillance: Secondary | ICD-10-CM | POA: Diagnosis not present

## 2018-05-31 DIAGNOSIS — I10 Essential (primary) hypertension: Secondary | ICD-10-CM | POA: Diagnosis not present

## 2018-05-31 DIAGNOSIS — J019 Acute sinusitis, unspecified: Secondary | ICD-10-CM | POA: Diagnosis not present

## 2018-06-01 DIAGNOSIS — M25511 Pain in right shoulder: Secondary | ICD-10-CM | POA: Diagnosis not present

## 2018-06-06 DIAGNOSIS — M542 Cervicalgia: Secondary | ICD-10-CM | POA: Diagnosis not present

## 2018-06-06 DIAGNOSIS — M4722 Other spondylosis with radiculopathy, cervical region: Secondary | ICD-10-CM | POA: Diagnosis not present

## 2018-06-08 DIAGNOSIS — M25511 Pain in right shoulder: Secondary | ICD-10-CM | POA: Diagnosis not present

## 2018-06-13 DIAGNOSIS — F331 Major depressive disorder, recurrent, moderate: Secondary | ICD-10-CM | POA: Diagnosis not present

## 2018-06-16 DIAGNOSIS — M25511 Pain in right shoulder: Secondary | ICD-10-CM | POA: Diagnosis not present

## 2018-06-16 DIAGNOSIS — M19011 Primary osteoarthritis, right shoulder: Secondary | ICD-10-CM | POA: Diagnosis not present

## 2018-06-21 DIAGNOSIS — M25511 Pain in right shoulder: Secondary | ICD-10-CM | POA: Diagnosis not present

## 2018-06-21 DIAGNOSIS — M792 Neuralgia and neuritis, unspecified: Secondary | ICD-10-CM | POA: Diagnosis not present

## 2018-06-21 DIAGNOSIS — M542 Cervicalgia: Secondary | ICD-10-CM | POA: Diagnosis not present

## 2018-06-21 DIAGNOSIS — M25519 Pain in unspecified shoulder: Secondary | ICD-10-CM | POA: Diagnosis not present

## 2018-07-04 DIAGNOSIS — I10 Essential (primary) hypertension: Secondary | ICD-10-CM | POA: Diagnosis not present

## 2018-07-04 DIAGNOSIS — Z124 Encounter for screening for malignant neoplasm of cervix: Secondary | ICD-10-CM | POA: Diagnosis not present

## 2018-07-04 DIAGNOSIS — L298 Other pruritus: Secondary | ICD-10-CM | POA: Diagnosis not present

## 2018-07-04 DIAGNOSIS — N760 Acute vaginitis: Secondary | ICD-10-CM | POA: Diagnosis not present

## 2018-07-04 DIAGNOSIS — E6609 Other obesity due to excess calories: Secondary | ICD-10-CM | POA: Diagnosis not present

## 2018-07-04 DIAGNOSIS — Z01419 Encounter for gynecological examination (general) (routine) without abnormal findings: Secondary | ICD-10-CM | POA: Diagnosis not present

## 2018-07-05 DIAGNOSIS — M25519 Pain in unspecified shoulder: Secondary | ICD-10-CM | POA: Diagnosis not present

## 2018-07-05 DIAGNOSIS — M25511 Pain in right shoulder: Secondary | ICD-10-CM | POA: Diagnosis not present

## 2018-07-12 DIAGNOSIS — Z01811 Encounter for preprocedural respiratory examination: Secondary | ICD-10-CM | POA: Diagnosis not present

## 2018-07-12 DIAGNOSIS — Z01810 Encounter for preprocedural cardiovascular examination: Secondary | ICD-10-CM | POA: Diagnosis not present

## 2018-07-12 DIAGNOSIS — Z01812 Encounter for preprocedural laboratory examination: Secondary | ICD-10-CM | POA: Diagnosis not present

## 2018-07-12 DIAGNOSIS — M25512 Pain in left shoulder: Secondary | ICD-10-CM | POA: Diagnosis not present

## 2018-07-13 DIAGNOSIS — Z01810 Encounter for preprocedural cardiovascular examination: Secondary | ICD-10-CM | POA: Diagnosis not present

## 2018-07-13 DIAGNOSIS — Z01811 Encounter for preprocedural respiratory examination: Secondary | ICD-10-CM | POA: Diagnosis not present

## 2018-07-13 DIAGNOSIS — R0989 Other specified symptoms and signs involving the circulatory and respiratory systems: Secondary | ICD-10-CM | POA: Diagnosis not present

## 2018-07-14 DIAGNOSIS — M19011 Primary osteoarthritis, right shoulder: Secondary | ICD-10-CM | POA: Diagnosis not present

## 2018-07-14 DIAGNOSIS — Z87891 Personal history of nicotine dependence: Secondary | ICD-10-CM | POA: Diagnosis not present

## 2018-07-14 DIAGNOSIS — M75111 Incomplete rotator cuff tear or rupture of right shoulder, not specified as traumatic: Secondary | ICD-10-CM | POA: Diagnosis not present

## 2018-07-14 DIAGNOSIS — Z79899 Other long term (current) drug therapy: Secondary | ICD-10-CM | POA: Diagnosis not present

## 2018-07-14 DIAGNOSIS — Z6837 Body mass index (BMI) 37.0-37.9, adult: Secondary | ICD-10-CM | POA: Diagnosis not present

## 2018-07-14 DIAGNOSIS — M24111 Other articular cartilage disorders, right shoulder: Secondary | ICD-10-CM | POA: Diagnosis not present

## 2018-07-14 DIAGNOSIS — K219 Gastro-esophageal reflux disease without esophagitis: Secondary | ICD-10-CM | POA: Diagnosis not present

## 2018-07-14 DIAGNOSIS — M25311 Other instability, right shoulder: Secondary | ICD-10-CM | POA: Diagnosis not present

## 2018-07-14 DIAGNOSIS — I10 Essential (primary) hypertension: Secondary | ICD-10-CM | POA: Diagnosis not present

## 2018-07-14 DIAGNOSIS — M5136 Other intervertebral disc degeneration, lumbar region: Secondary | ICD-10-CM | POA: Diagnosis not present

## 2018-07-14 DIAGNOSIS — G8918 Other acute postprocedural pain: Secondary | ICD-10-CM | POA: Diagnosis not present

## 2018-07-14 DIAGNOSIS — Z01810 Encounter for preprocedural cardiovascular examination: Secondary | ICD-10-CM | POA: Diagnosis not present

## 2018-07-14 DIAGNOSIS — S43431A Superior glenoid labrum lesion of right shoulder, initial encounter: Secondary | ICD-10-CM | POA: Diagnosis not present

## 2018-07-14 DIAGNOSIS — F419 Anxiety disorder, unspecified: Secondary | ICD-10-CM | POA: Diagnosis not present

## 2018-07-14 DIAGNOSIS — E559 Vitamin D deficiency, unspecified: Secondary | ICD-10-CM | POA: Diagnosis not present

## 2018-07-14 DIAGNOSIS — Z01811 Encounter for preprocedural respiratory examination: Secondary | ICD-10-CM | POA: Diagnosis not present

## 2018-07-14 DIAGNOSIS — E039 Hypothyroidism, unspecified: Secondary | ICD-10-CM | POA: Diagnosis not present

## 2018-07-14 DIAGNOSIS — Z79890 Hormone replacement therapy (postmenopausal): Secondary | ICD-10-CM | POA: Diagnosis not present

## 2018-07-14 DIAGNOSIS — F329 Major depressive disorder, single episode, unspecified: Secondary | ICD-10-CM | POA: Diagnosis not present

## 2018-07-14 DIAGNOSIS — M7551 Bursitis of right shoulder: Secondary | ICD-10-CM | POA: Diagnosis not present

## 2018-07-14 DIAGNOSIS — G4730 Sleep apnea, unspecified: Secondary | ICD-10-CM | POA: Diagnosis not present

## 2018-07-15 DIAGNOSIS — R457 State of emotional shock and stress, unspecified: Secondary | ICD-10-CM | POA: Diagnosis not present

## 2018-07-15 DIAGNOSIS — R079 Chest pain, unspecified: Secondary | ICD-10-CM | POA: Diagnosis not present

## 2018-07-15 DIAGNOSIS — M1990 Unspecified osteoarthritis, unspecified site: Secondary | ICD-10-CM | POA: Diagnosis not present

## 2018-07-15 DIAGNOSIS — R0602 Shortness of breath: Secondary | ICD-10-CM | POA: Diagnosis not present

## 2018-07-15 DIAGNOSIS — R200 Anesthesia of skin: Secondary | ICD-10-CM | POA: Diagnosis not present

## 2018-07-15 DIAGNOSIS — J181 Lobar pneumonia, unspecified organism: Secondary | ICD-10-CM | POA: Diagnosis not present

## 2018-07-15 DIAGNOSIS — I10 Essential (primary) hypertension: Secondary | ICD-10-CM | POA: Diagnosis not present

## 2018-07-19 DIAGNOSIS — I10 Essential (primary) hypertension: Secondary | ICD-10-CM | POA: Diagnosis not present

## 2018-07-19 DIAGNOSIS — Z713 Dietary counseling and surveillance: Secondary | ICD-10-CM | POA: Diagnosis not present

## 2018-07-19 DIAGNOSIS — R5381 Other malaise: Secondary | ICD-10-CM | POA: Diagnosis not present

## 2018-07-19 DIAGNOSIS — J188 Other pneumonia, unspecified organism: Secondary | ICD-10-CM | POA: Diagnosis not present

## 2018-07-19 DIAGNOSIS — R05 Cough: Secondary | ICD-10-CM | POA: Diagnosis not present

## 2018-07-26 DIAGNOSIS — R2689 Other abnormalities of gait and mobility: Secondary | ICD-10-CM | POA: Diagnosis not present

## 2018-07-26 DIAGNOSIS — M25511 Pain in right shoulder: Secondary | ICD-10-CM | POA: Diagnosis not present

## 2018-07-28 DIAGNOSIS — R2689 Other abnormalities of gait and mobility: Secondary | ICD-10-CM | POA: Diagnosis not present

## 2018-07-28 DIAGNOSIS — M25511 Pain in right shoulder: Secondary | ICD-10-CM | POA: Diagnosis not present

## 2018-08-01 DIAGNOSIS — R2689 Other abnormalities of gait and mobility: Secondary | ICD-10-CM | POA: Diagnosis not present

## 2018-08-01 DIAGNOSIS — M25511 Pain in right shoulder: Secondary | ICD-10-CM | POA: Diagnosis not present

## 2018-08-04 DIAGNOSIS — R2689 Other abnormalities of gait and mobility: Secondary | ICD-10-CM | POA: Diagnosis not present

## 2018-08-04 DIAGNOSIS — M25511 Pain in right shoulder: Secondary | ICD-10-CM | POA: Diagnosis not present

## 2018-08-07 DIAGNOSIS — R2689 Other abnormalities of gait and mobility: Secondary | ICD-10-CM | POA: Diagnosis not present

## 2018-08-07 DIAGNOSIS — M25511 Pain in right shoulder: Secondary | ICD-10-CM | POA: Diagnosis not present

## 2018-08-10 DIAGNOSIS — M25511 Pain in right shoulder: Secondary | ICD-10-CM | POA: Diagnosis not present

## 2018-08-10 DIAGNOSIS — R2689 Other abnormalities of gait and mobility: Secondary | ICD-10-CM | POA: Diagnosis not present

## 2018-08-14 DIAGNOSIS — M25511 Pain in right shoulder: Secondary | ICD-10-CM | POA: Diagnosis not present

## 2018-08-14 DIAGNOSIS — R2689 Other abnormalities of gait and mobility: Secondary | ICD-10-CM | POA: Diagnosis not present

## 2018-08-16 DIAGNOSIS — Z23 Encounter for immunization: Secondary | ICD-10-CM | POA: Diagnosis not present

## 2018-08-16 DIAGNOSIS — R5383 Other fatigue: Secondary | ICD-10-CM | POA: Diagnosis not present

## 2018-08-16 DIAGNOSIS — I10 Essential (primary) hypertension: Secondary | ICD-10-CM | POA: Diagnosis not present

## 2018-08-16 DIAGNOSIS — M542 Cervicalgia: Secondary | ICD-10-CM | POA: Diagnosis not present

## 2018-08-16 DIAGNOSIS — M25511 Pain in right shoulder: Secondary | ICD-10-CM | POA: Diagnosis not present

## 2018-08-16 DIAGNOSIS — E6609 Other obesity due to excess calories: Secondary | ICD-10-CM | POA: Diagnosis not present

## 2018-08-17 DIAGNOSIS — M25511 Pain in right shoulder: Secondary | ICD-10-CM | POA: Diagnosis not present

## 2018-08-17 DIAGNOSIS — R2689 Other abnormalities of gait and mobility: Secondary | ICD-10-CM | POA: Diagnosis not present

## 2018-08-21 DIAGNOSIS — R2689 Other abnormalities of gait and mobility: Secondary | ICD-10-CM | POA: Diagnosis not present

## 2018-08-21 DIAGNOSIS — M25511 Pain in right shoulder: Secondary | ICD-10-CM | POA: Diagnosis not present

## 2018-08-24 DIAGNOSIS — R2689 Other abnormalities of gait and mobility: Secondary | ICD-10-CM | POA: Diagnosis not present

## 2018-08-24 DIAGNOSIS — M25511 Pain in right shoulder: Secondary | ICD-10-CM | POA: Diagnosis not present

## 2018-08-29 DIAGNOSIS — R2689 Other abnormalities of gait and mobility: Secondary | ICD-10-CM | POA: Diagnosis not present

## 2018-08-29 DIAGNOSIS — M25511 Pain in right shoulder: Secondary | ICD-10-CM | POA: Diagnosis not present

## 2018-09-05 DIAGNOSIS — R2689 Other abnormalities of gait and mobility: Secondary | ICD-10-CM | POA: Diagnosis not present

## 2018-09-05 DIAGNOSIS — M25511 Pain in right shoulder: Secondary | ICD-10-CM | POA: Diagnosis not present

## 2018-09-08 DIAGNOSIS — R2689 Other abnormalities of gait and mobility: Secondary | ICD-10-CM | POA: Diagnosis not present

## 2018-09-08 DIAGNOSIS — M25511 Pain in right shoulder: Secondary | ICD-10-CM | POA: Diagnosis not present

## 2018-09-13 DIAGNOSIS — F331 Major depressive disorder, recurrent, moderate: Secondary | ICD-10-CM | POA: Diagnosis not present

## 2018-09-14 DIAGNOSIS — M25511 Pain in right shoulder: Secondary | ICD-10-CM | POA: Diagnosis not present

## 2018-09-14 DIAGNOSIS — R2689 Other abnormalities of gait and mobility: Secondary | ICD-10-CM | POA: Diagnosis not present

## 2018-09-15 DIAGNOSIS — R2689 Other abnormalities of gait and mobility: Secondary | ICD-10-CM | POA: Diagnosis not present

## 2018-09-15 DIAGNOSIS — M25511 Pain in right shoulder: Secondary | ICD-10-CM | POA: Diagnosis not present

## 2018-09-18 DIAGNOSIS — R1084 Generalized abdominal pain: Secondary | ICD-10-CM | POA: Diagnosis not present

## 2018-09-20 DIAGNOSIS — M25511 Pain in right shoulder: Secondary | ICD-10-CM | POA: Diagnosis not present

## 2018-09-20 DIAGNOSIS — R2689 Other abnormalities of gait and mobility: Secondary | ICD-10-CM | POA: Diagnosis not present

## 2018-09-27 DIAGNOSIS — R2689 Other abnormalities of gait and mobility: Secondary | ICD-10-CM | POA: Diagnosis not present

## 2018-09-27 DIAGNOSIS — M25511 Pain in right shoulder: Secondary | ICD-10-CM | POA: Diagnosis not present

## 2018-10-03 DIAGNOSIS — E038 Other specified hypothyroidism: Secondary | ICD-10-CM | POA: Diagnosis not present

## 2018-10-03 DIAGNOSIS — Z130 Encounter for screening for diseases of the blood and blood-forming organs and certain disorders involving the immune mechanism: Secondary | ICD-10-CM | POA: Diagnosis not present

## 2018-10-03 DIAGNOSIS — I10 Essential (primary) hypertension: Secondary | ICD-10-CM | POA: Diagnosis not present

## 2018-10-03 DIAGNOSIS — Z0189 Encounter for other specified special examinations: Secondary | ICD-10-CM | POA: Diagnosis not present

## 2018-10-03 DIAGNOSIS — E6609 Other obesity due to excess calories: Secondary | ICD-10-CM | POA: Diagnosis not present

## 2018-10-03 DIAGNOSIS — E663 Overweight: Secondary | ICD-10-CM | POA: Diagnosis not present

## 2018-10-03 DIAGNOSIS — Z713 Dietary counseling and surveillance: Secondary | ICD-10-CM | POA: Diagnosis not present

## 2018-10-03 DIAGNOSIS — R7309 Other abnormal glucose: Secondary | ICD-10-CM | POA: Diagnosis not present

## 2018-10-03 DIAGNOSIS — E039 Hypothyroidism, unspecified: Secondary | ICD-10-CM | POA: Diagnosis not present

## 2018-10-03 DIAGNOSIS — R799 Abnormal finding of blood chemistry, unspecified: Secondary | ICD-10-CM | POA: Diagnosis not present

## 2018-10-03 DIAGNOSIS — E559 Vitamin D deficiency, unspecified: Secondary | ICD-10-CM | POA: Diagnosis not present

## 2018-10-03 DIAGNOSIS — R52 Pain, unspecified: Secondary | ICD-10-CM | POA: Diagnosis not present

## 2018-10-03 DIAGNOSIS — R0602 Shortness of breath: Secondary | ICD-10-CM | POA: Diagnosis not present

## 2018-10-12 DIAGNOSIS — M25511 Pain in right shoulder: Secondary | ICD-10-CM | POA: Diagnosis not present

## 2018-10-12 DIAGNOSIS — R2689 Other abnormalities of gait and mobility: Secondary | ICD-10-CM | POA: Diagnosis not present

## 2018-10-30 DIAGNOSIS — I712 Thoracic aortic aneurysm, without rupture: Secondary | ICD-10-CM | POA: Diagnosis not present

## 2018-10-30 DIAGNOSIS — I2694 Multiple subsegmental pulmonary emboli without acute cor pulmonale: Secondary | ICD-10-CM | POA: Diagnosis not present

## 2018-10-30 DIAGNOSIS — R072 Precordial pain: Secondary | ICD-10-CM | POA: Diagnosis not present

## 2018-10-31 DIAGNOSIS — I712 Thoracic aortic aneurysm, without rupture: Secondary | ICD-10-CM | POA: Diagnosis not present

## 2018-11-02 DIAGNOSIS — M25511 Pain in right shoulder: Secondary | ICD-10-CM | POA: Diagnosis not present

## 2018-11-16 DIAGNOSIS — I2699 Other pulmonary embolism without acute cor pulmonale: Secondary | ICD-10-CM | POA: Diagnosis not present

## 2018-11-16 DIAGNOSIS — R110 Nausea: Secondary | ICD-10-CM | POA: Diagnosis not present

## 2018-11-16 DIAGNOSIS — R52 Pain, unspecified: Secondary | ICD-10-CM | POA: Diagnosis not present

## 2018-11-16 DIAGNOSIS — I10 Essential (primary) hypertension: Secondary | ICD-10-CM | POA: Diagnosis not present

## 2018-11-17 DIAGNOSIS — Z86711 Personal history of pulmonary embolism: Secondary | ICD-10-CM | POA: Diagnosis not present

## 2018-11-17 DIAGNOSIS — I2699 Other pulmonary embolism without acute cor pulmonale: Secondary | ICD-10-CM | POA: Diagnosis not present

## 2018-11-17 DIAGNOSIS — Z7901 Long term (current) use of anticoagulants: Secondary | ICD-10-CM | POA: Diagnosis not present

## 2018-11-17 DIAGNOSIS — Z90710 Acquired absence of both cervix and uterus: Secondary | ICD-10-CM | POA: Diagnosis not present

## 2018-11-17 DIAGNOSIS — Z793 Long term (current) use of hormonal contraceptives: Secondary | ICD-10-CM | POA: Diagnosis not present

## 2018-11-22 DIAGNOSIS — R5383 Other fatigue: Secondary | ICD-10-CM | POA: Diagnosis not present

## 2018-11-22 DIAGNOSIS — I2699 Other pulmonary embolism without acute cor pulmonale: Secondary | ICD-10-CM | POA: Diagnosis not present

## 2018-11-22 DIAGNOSIS — J4540 Moderate persistent asthma, uncomplicated: Secondary | ICD-10-CM | POA: Diagnosis not present

## 2018-11-22 DIAGNOSIS — R2689 Other abnormalities of gait and mobility: Secondary | ICD-10-CM | POA: Diagnosis not present

## 2018-11-22 DIAGNOSIS — G4733 Obstructive sleep apnea (adult) (pediatric): Secondary | ICD-10-CM | POA: Diagnosis not present

## 2018-11-22 DIAGNOSIS — J301 Allergic rhinitis due to pollen: Secondary | ICD-10-CM | POA: Diagnosis not present

## 2018-11-22 DIAGNOSIS — E662 Morbid (severe) obesity with alveolar hypoventilation: Secondary | ICD-10-CM | POA: Diagnosis not present

## 2018-11-28 DIAGNOSIS — Z1231 Encounter for screening mammogram for malignant neoplasm of breast: Secondary | ICD-10-CM | POA: Diagnosis not present

## 2018-12-01 DIAGNOSIS — I2699 Other pulmonary embolism without acute cor pulmonale: Secondary | ICD-10-CM | POA: Diagnosis not present

## 2018-12-01 DIAGNOSIS — I2694 Multiple subsegmental pulmonary emboli without acute cor pulmonale: Secondary | ICD-10-CM | POA: Diagnosis not present

## 2018-12-01 DIAGNOSIS — Z7901 Long term (current) use of anticoagulants: Secondary | ICD-10-CM | POA: Diagnosis not present

## 2018-12-01 DIAGNOSIS — Z793 Long term (current) use of hormonal contraceptives: Secondary | ICD-10-CM | POA: Diagnosis not present

## 2018-12-01 DIAGNOSIS — Z90710 Acquired absence of both cervix and uterus: Secondary | ICD-10-CM | POA: Diagnosis not present

## 2018-12-02 DIAGNOSIS — G4733 Obstructive sleep apnea (adult) (pediatric): Secondary | ICD-10-CM | POA: Diagnosis not present

## 2018-12-06 DIAGNOSIS — M5442 Lumbago with sciatica, left side: Secondary | ICD-10-CM | POA: Diagnosis not present

## 2018-12-06 DIAGNOSIS — R5383 Other fatigue: Secondary | ICD-10-CM | POA: Diagnosis not present

## 2018-12-06 DIAGNOSIS — M5136 Other intervertebral disc degeneration, lumbar region: Secondary | ICD-10-CM | POA: Diagnosis not present

## 2018-12-06 DIAGNOSIS — R0982 Postnasal drip: Secondary | ICD-10-CM | POA: Diagnosis not present

## 2018-12-06 DIAGNOSIS — M9903 Segmental and somatic dysfunction of lumbar region: Secondary | ICD-10-CM | POA: Diagnosis not present

## 2018-12-06 DIAGNOSIS — M9904 Segmental and somatic dysfunction of sacral region: Secondary | ICD-10-CM | POA: Diagnosis not present

## 2018-12-06 DIAGNOSIS — R0981 Nasal congestion: Secondary | ICD-10-CM | POA: Diagnosis not present

## 2018-12-06 DIAGNOSIS — E662 Morbid (severe) obesity with alveolar hypoventilation: Secondary | ICD-10-CM | POA: Diagnosis not present

## 2018-12-06 DIAGNOSIS — R2689 Other abnormalities of gait and mobility: Secondary | ICD-10-CM | POA: Diagnosis not present

## 2018-12-06 DIAGNOSIS — G4733 Obstructive sleep apnea (adult) (pediatric): Secondary | ICD-10-CM | POA: Diagnosis not present

## 2018-12-06 DIAGNOSIS — M9902 Segmental and somatic dysfunction of thoracic region: Secondary | ICD-10-CM | POA: Diagnosis not present

## 2018-12-06 DIAGNOSIS — J4540 Moderate persistent asthma, uncomplicated: Secondary | ICD-10-CM | POA: Diagnosis not present

## 2018-12-11 DIAGNOSIS — H43813 Vitreous degeneration, bilateral: Secondary | ICD-10-CM | POA: Diagnosis not present

## 2018-12-11 DIAGNOSIS — H25813 Combined forms of age-related cataract, bilateral: Secondary | ICD-10-CM | POA: Diagnosis not present

## 2018-12-11 DIAGNOSIS — H5203 Hypermetropia, bilateral: Secondary | ICD-10-CM | POA: Diagnosis not present

## 2018-12-11 DIAGNOSIS — H52223 Regular astigmatism, bilateral: Secondary | ICD-10-CM | POA: Diagnosis not present

## 2018-12-11 DIAGNOSIS — H524 Presbyopia: Secondary | ICD-10-CM | POA: Diagnosis not present

## 2018-12-11 DIAGNOSIS — I10 Essential (primary) hypertension: Secondary | ICD-10-CM | POA: Diagnosis not present

## 2018-12-12 DIAGNOSIS — E663 Overweight: Secondary | ICD-10-CM | POA: Diagnosis not present

## 2018-12-12 DIAGNOSIS — I10 Essential (primary) hypertension: Secondary | ICD-10-CM | POA: Diagnosis not present

## 2018-12-12 DIAGNOSIS — R52 Pain, unspecified: Secondary | ICD-10-CM | POA: Diagnosis not present

## 2018-12-12 DIAGNOSIS — F411 Generalized anxiety disorder: Secondary | ICD-10-CM | POA: Diagnosis not present

## 2018-12-12 DIAGNOSIS — E039 Hypothyroidism, unspecified: Secondary | ICD-10-CM | POA: Diagnosis not present

## 2018-12-12 DIAGNOSIS — E6609 Other obesity due to excess calories: Secondary | ICD-10-CM | POA: Diagnosis not present

## 2018-12-12 DIAGNOSIS — E559 Vitamin D deficiency, unspecified: Secondary | ICD-10-CM | POA: Diagnosis not present

## 2018-12-12 DIAGNOSIS — R5383 Other fatigue: Secondary | ICD-10-CM | POA: Diagnosis not present

## 2018-12-12 DIAGNOSIS — R0602 Shortness of breath: Secondary | ICD-10-CM | POA: Diagnosis not present

## 2018-12-12 DIAGNOSIS — R7309 Other abnormal glucose: Secondary | ICD-10-CM | POA: Diagnosis not present

## 2018-12-12 DIAGNOSIS — F331 Major depressive disorder, recurrent, moderate: Secondary | ICD-10-CM | POA: Diagnosis not present

## 2018-12-15 DIAGNOSIS — J305 Allergic rhinitis due to food: Secondary | ICD-10-CM | POA: Diagnosis not present

## 2018-12-25 DIAGNOSIS — I10 Essential (primary) hypertension: Secondary | ICD-10-CM | POA: Diagnosis not present

## 2018-12-25 DIAGNOSIS — I2699 Other pulmonary embolism without acute cor pulmonale: Secondary | ICD-10-CM | POA: Diagnosis not present

## 2018-12-25 DIAGNOSIS — R05 Cough: Secondary | ICD-10-CM | POA: Diagnosis not present

## 2018-12-25 DIAGNOSIS — R51 Headache: Secondary | ICD-10-CM | POA: Diagnosis not present

## 2018-12-25 DIAGNOSIS — E039 Hypothyroidism, unspecified: Secondary | ICD-10-CM | POA: Diagnosis not present

## 2018-12-25 DIAGNOSIS — Z7901 Long term (current) use of anticoagulants: Secondary | ICD-10-CM | POA: Diagnosis not present

## 2018-12-25 DIAGNOSIS — J069 Acute upper respiratory infection, unspecified: Secondary | ICD-10-CM | POA: Diagnosis not present

## 2018-12-27 DIAGNOSIS — G4733 Obstructive sleep apnea (adult) (pediatric): Secondary | ICD-10-CM | POA: Diagnosis not present

## 2018-12-29 DIAGNOSIS — J069 Acute upper respiratory infection, unspecified: Secondary | ICD-10-CM | POA: Diagnosis not present

## 2018-12-31 DIAGNOSIS — R062 Wheezing: Secondary | ICD-10-CM | POA: Diagnosis not present

## 2018-12-31 DIAGNOSIS — R0602 Shortness of breath: Secondary | ICD-10-CM | POA: Diagnosis not present

## 2018-12-31 DIAGNOSIS — R0781 Pleurodynia: Secondary | ICD-10-CM | POA: Diagnosis not present

## 2018-12-31 DIAGNOSIS — R05 Cough: Secondary | ICD-10-CM | POA: Diagnosis not present

## 2018-12-31 DIAGNOSIS — J189 Pneumonia, unspecified organism: Secondary | ICD-10-CM | POA: Diagnosis not present

## 2019-01-03 DIAGNOSIS — R0602 Shortness of breath: Secondary | ICD-10-CM | POA: Diagnosis not present

## 2019-01-03 DIAGNOSIS — J189 Pneumonia, unspecified organism: Secondary | ICD-10-CM | POA: Diagnosis not present

## 2019-01-03 DIAGNOSIS — R110 Nausea: Secondary | ICD-10-CM | POA: Diagnosis not present

## 2019-01-03 DIAGNOSIS — R062 Wheezing: Secondary | ICD-10-CM | POA: Diagnosis not present

## 2019-01-03 DIAGNOSIS — R0781 Pleurodynia: Secondary | ICD-10-CM | POA: Diagnosis not present

## 2019-01-03 DIAGNOSIS — R05 Cough: Secondary | ICD-10-CM | POA: Diagnosis not present

## 2019-01-03 DIAGNOSIS — I10 Essential (primary) hypertension: Secondary | ICD-10-CM | POA: Diagnosis not present

## 2019-01-04 DIAGNOSIS — J069 Acute upper respiratory infection, unspecified: Secondary | ICD-10-CM | POA: Diagnosis not present

## 2019-01-04 DIAGNOSIS — I2699 Other pulmonary embolism without acute cor pulmonale: Secondary | ICD-10-CM | POA: Diagnosis not present

## 2019-01-04 DIAGNOSIS — R5382 Chronic fatigue, unspecified: Secondary | ICD-10-CM | POA: Diagnosis not present

## 2019-01-04 DIAGNOSIS — I10 Essential (primary) hypertension: Secondary | ICD-10-CM | POA: Diagnosis not present

## 2019-01-04 DIAGNOSIS — E039 Hypothyroidism, unspecified: Secondary | ICD-10-CM | POA: Diagnosis not present

## 2019-01-11 DIAGNOSIS — F3341 Major depressive disorder, recurrent, in partial remission: Secondary | ICD-10-CM | POA: Diagnosis not present

## 2019-01-11 DIAGNOSIS — F419 Anxiety disorder, unspecified: Secondary | ICD-10-CM | POA: Diagnosis not present

## 2019-01-11 DIAGNOSIS — E782 Mixed hyperlipidemia: Secondary | ICD-10-CM | POA: Diagnosis not present

## 2019-01-11 DIAGNOSIS — I2699 Other pulmonary embolism without acute cor pulmonale: Secondary | ICD-10-CM | POA: Diagnosis not present

## 2019-01-11 DIAGNOSIS — Z1331 Encounter for screening for depression: Secondary | ICD-10-CM | POA: Diagnosis not present

## 2019-01-12 DIAGNOSIS — G4733 Obstructive sleep apnea (adult) (pediatric): Secondary | ICD-10-CM | POA: Diagnosis not present

## 2019-01-30 DIAGNOSIS — J4540 Moderate persistent asthma, uncomplicated: Secondary | ICD-10-CM | POA: Diagnosis not present

## 2019-01-30 DIAGNOSIS — R5383 Other fatigue: Secondary | ICD-10-CM | POA: Diagnosis not present

## 2019-01-30 DIAGNOSIS — E662 Morbid (severe) obesity with alveolar hypoventilation: Secondary | ICD-10-CM | POA: Diagnosis not present

## 2019-01-30 DIAGNOSIS — G4733 Obstructive sleep apnea (adult) (pediatric): Secondary | ICD-10-CM | POA: Diagnosis not present

## 2019-01-30 DIAGNOSIS — R2689 Other abnormalities of gait and mobility: Secondary | ICD-10-CM | POA: Diagnosis not present

## 2019-02-13 DIAGNOSIS — E782 Mixed hyperlipidemia: Secondary | ICD-10-CM | POA: Diagnosis not present

## 2019-02-13 DIAGNOSIS — Z1331 Encounter for screening for depression: Secondary | ICD-10-CM | POA: Diagnosis not present

## 2019-02-13 DIAGNOSIS — F3341 Major depressive disorder, recurrent, in partial remission: Secondary | ICD-10-CM | POA: Diagnosis not present

## 2019-02-13 DIAGNOSIS — I2699 Other pulmonary embolism without acute cor pulmonale: Secondary | ICD-10-CM | POA: Diagnosis not present

## 2019-02-13 DIAGNOSIS — I10 Essential (primary) hypertension: Secondary | ICD-10-CM | POA: Diagnosis not present

## 2019-02-19 DIAGNOSIS — R5383 Other fatigue: Secondary | ICD-10-CM | POA: Diagnosis not present

## 2019-02-19 DIAGNOSIS — J4540 Moderate persistent asthma, uncomplicated: Secondary | ICD-10-CM | POA: Diagnosis not present

## 2019-02-19 DIAGNOSIS — G4733 Obstructive sleep apnea (adult) (pediatric): Secondary | ICD-10-CM | POA: Diagnosis not present

## 2019-02-19 DIAGNOSIS — E662 Morbid (severe) obesity with alveolar hypoventilation: Secondary | ICD-10-CM | POA: Diagnosis not present

## 2019-02-19 DIAGNOSIS — R2689 Other abnormalities of gait and mobility: Secondary | ICD-10-CM | POA: Diagnosis not present

## 2019-02-22 DIAGNOSIS — H43391 Other vitreous opacities, right eye: Secondary | ICD-10-CM | POA: Diagnosis not present

## 2019-02-22 DIAGNOSIS — H43811 Vitreous degeneration, right eye: Secondary | ICD-10-CM | POA: Diagnosis not present

## 2019-03-06 DIAGNOSIS — J4540 Moderate persistent asthma, uncomplicated: Secondary | ICD-10-CM | POA: Diagnosis not present

## 2019-03-07 ENCOUNTER — Other Ambulatory Visit: Payer: Self-pay

## 2019-03-07 ENCOUNTER — Encounter: Payer: Self-pay | Admitting: Sports Medicine

## 2019-03-07 ENCOUNTER — Other Ambulatory Visit: Payer: Self-pay | Admitting: Sports Medicine

## 2019-03-07 ENCOUNTER — Ambulatory Visit (INDEPENDENT_AMBULATORY_CARE_PROVIDER_SITE_OTHER): Payer: Medicare Other

## 2019-03-07 ENCOUNTER — Ambulatory Visit (INDEPENDENT_AMBULATORY_CARE_PROVIDER_SITE_OTHER): Payer: Medicare Other | Admitting: Sports Medicine

## 2019-03-07 VITALS — BP 162/85 | HR 80 | Temp 96.2°F | Resp 16 | Ht 65.0 in | Wt 237.0 lb

## 2019-03-07 DIAGNOSIS — M722 Plantar fascial fibromatosis: Secondary | ICD-10-CM | POA: Diagnosis not present

## 2019-03-07 DIAGNOSIS — M79671 Pain in right foot: Secondary | ICD-10-CM

## 2019-03-07 MED ORDER — TRIAMCINOLONE ACETONIDE 40 MG/ML IJ SUSP
20.0000 mg | Freq: Once | INTRAMUSCULAR | Status: AC
  Administered 2019-03-07: 20 mg

## 2019-03-07 NOTE — Patient Instructions (Signed)

## 2019-03-07 NOTE — Progress Notes (Signed)
Subjective: Marie Chavez is a 63 y.o. female patient presents to office with complaint of moderate heel pain on the right. Patient admits to post static dyskinesia for 1 month in duration. 8/10 that could have started after trying to walk the track for exercise. Patient has treated this problem with tyelnol with no relief. Denies any other pedal complaints.   Admits to history to neuropathy on gabapentin with spine issues  Reports that she is on Xarleto for PE till August.   Review of Systems  All other systems reviewed and are negative.    Patient Active Problem List   Diagnosis Date Noted  . Lung cancer ([place]) 11/28/2013  . ACE-inhibitor cough 11/07/2013    Current Outpatient Medications on File Prior to Visit  Medication Sig Dispense Refill  . atenolol (TENORMIN) 50 MG tablet Take 50 mg by mouth daily.      . clonazePAM (KLONOPIN) 1 MG tablet Take 1 mg by mouth 2 (two) times daily as needed for anxiety.     Marland Kitchen EPIPEN 2-PAK 0.3 MG/0.3ML SOAJ injection Inject 0.3 mg as directed once. As needed    . escitalopram (LEXAPRO) 10 MG tablet Take 10 mg by mouth at bedtime.     Marland Kitchen estradiol (VIVELLE-DOT) 0.05 MG/24HR Place 1 patch onto the skin once a week. On Saturday    . fluticasone (FLONASE) 50 MCG/ACT nasal spray Place 2 sprays into both nostrils daily as needed for allergies.     Marland Kitchen gabapentin (NEURONTIN) 300 MG capsule Take 300 mg by mouth at bedtime as needed.    Marland Kitchen levothyroxine (SYNTHROID, LEVOTHROID) 175 MCG tablet     . lisinopril (PRINIVIL,ZESTRIL) 5 MG tablet Take 5 mg by mouth daily.     . mometasone (NASONEX) 50 MCG/ACT nasal spray Place 2 sprays into both nostrils daily.    . nabumetone (RELAFEN) 750 MG tablet Take 750 mg by mouth at bedtime as needed for mild pain.     Marland Kitchen PROAIR HFA 108 (90 BASE) MCG/ACT inhaler   0  . promethazine (PHENERGAN) 25 MG tablet Take 25 mg by mouth every 6 (six) hours as needed for nausea or vomiting.     No current facility-administered  medications on file prior to visit.     Allergies  Allergen Reactions  . Bee Venom Anaphylaxis and Other (See Comments)    Developed "blue rings" all over body after being stung  . Celebrex [Celecoxib]     REACTION: heart rate increases, itching  . Mobic [Meloxicam] Itching    HEART RACES  . Prednisone     REACTION: Heart rate increases, itching    Objective: Physical Exam General: The patient is alert and oriented x3 in no acute distress.  Dermatology: Skin is warm, dry and supple bilateral lower extremities. Nails 1-10 are normal. There is no erythema, edema, no eccymosis, no open lesions present. Integument is otherwise unremarkable.  Vascular: Dorsalis Pedis pulse and Posterior Tibial pulse are 1/4 bilateral. Capillary fill time is immediate to all digits.  Neurological: Grossly intact to light touch with an achilles reflex of +2/5 and a negative Tinel's sign bilateral.  Musculoskeletal: Tenderness to palpation at the medial calcaneal tubercale and through the insertion of the plantar fascia on the right foot. No pain with compression of calcaneus bilateral. No pain with tuning fork to calcaneus bilateral. No pain with calf compression bilateral. There is decreased Ankle joint range of motion bilateral. + Midfoot bone spur bilateral. All other joints range of motion within normal  limits bilateral. Strength 5/5 in all groups bilateral.   Gait: Unassisted, Antalgic avoid weight on right heel  Xray, Right foot:  Normal osseous mineralization. Joint spaces preserved except at midfoot. No fracture/dislocation/boney destruction. Calcaneal spur present with mild thickening of plantar fascia. No other soft tissue abnormalities or radiopaque foreign bodies.   Assessment and Plan: Problem List Items Addressed This Visit    None    Visit Diagnoses    Plantar fasciitis of right foot    -  Primary   Relevant Medications   triamcinolone acetonide (KENALOG-40) injection 20 mg (Completed)  (Start on 03/07/2019  9:00 AM)   Right foot pain          -Complete examination performed.  -Xrays reviewed -Discussed with patient in detail the condition of plantar fasciitis, how this occurs and general treatment options. Explained both conservative and surgical treatments.  -After oral consent and aseptic prep, injected a mixture containing 1 ml of 2% plain lidocaine, 1 ml 0.5% plain marcaine, 0.5 ml of kenalog 40 and 0.5 ml of dexamethasone phosphate into right heel. Post-injection care discussed with patient.  -Recommended good supportive shoes and advised use of OTC insert and heel lifts as dispensed at this visit. -Explained and dispensed to patient daily stretching exercises. -Recommend patient to ice affected area 1-2x daily. -Patient to return to office in 4 weeks for follow up or sooner if problems or questions arise.  Landis Martins, DPM

## 2019-03-12 DIAGNOSIS — J4540 Moderate persistent asthma, uncomplicated: Secondary | ICD-10-CM | POA: Diagnosis not present

## 2019-03-12 DIAGNOSIS — R5383 Other fatigue: Secondary | ICD-10-CM | POA: Diagnosis not present

## 2019-03-12 DIAGNOSIS — R2689 Other abnormalities of gait and mobility: Secondary | ICD-10-CM | POA: Diagnosis not present

## 2019-03-12 DIAGNOSIS — E662 Morbid (severe) obesity with alveolar hypoventilation: Secondary | ICD-10-CM | POA: Diagnosis not present

## 2019-03-12 DIAGNOSIS — G4733 Obstructive sleep apnea (adult) (pediatric): Secondary | ICD-10-CM | POA: Diagnosis not present

## 2019-03-21 DIAGNOSIS — R7989 Other specified abnormal findings of blood chemistry: Secondary | ICD-10-CM | POA: Diagnosis not present

## 2019-03-21 DIAGNOSIS — F3341 Major depressive disorder, recurrent, in partial remission: Secondary | ICD-10-CM | POA: Diagnosis not present

## 2019-03-21 DIAGNOSIS — R945 Abnormal results of liver function studies: Secondary | ICD-10-CM | POA: Diagnosis not present

## 2019-03-21 DIAGNOSIS — I2699 Other pulmonary embolism without acute cor pulmonale: Secondary | ICD-10-CM | POA: Diagnosis not present

## 2019-03-21 DIAGNOSIS — E782 Mixed hyperlipidemia: Secondary | ICD-10-CM | POA: Diagnosis not present

## 2019-03-22 DIAGNOSIS — N3941 Urge incontinence: Secondary | ICD-10-CM | POA: Diagnosis not present

## 2019-03-22 DIAGNOSIS — N3944 Nocturnal enuresis: Secondary | ICD-10-CM | POA: Diagnosis not present

## 2019-03-22 DIAGNOSIS — N3020 Other chronic cystitis without hematuria: Secondary | ICD-10-CM | POA: Diagnosis not present

## 2019-03-25 DIAGNOSIS — J4540 Moderate persistent asthma, uncomplicated: Secondary | ICD-10-CM | POA: Diagnosis not present

## 2019-03-25 DIAGNOSIS — G4733 Obstructive sleep apnea (adult) (pediatric): Secondary | ICD-10-CM | POA: Diagnosis not present

## 2019-03-29 ENCOUNTER — Other Ambulatory Visit: Payer: Self-pay

## 2019-03-29 ENCOUNTER — Ambulatory Visit (INDEPENDENT_AMBULATORY_CARE_PROVIDER_SITE_OTHER): Payer: Medicare Other | Admitting: Sports Medicine

## 2019-03-29 ENCOUNTER — Encounter: Payer: Self-pay | Admitting: Sports Medicine

## 2019-03-29 DIAGNOSIS — M722 Plantar fascial fibromatosis: Secondary | ICD-10-CM | POA: Diagnosis not present

## 2019-03-29 DIAGNOSIS — M79671 Pain in right foot: Secondary | ICD-10-CM

## 2019-03-29 MED ORDER — TRIAMCINOLONE ACETONIDE 40 MG/ML IJ SUSP
20.0000 mg | Freq: Once | INTRAMUSCULAR | Status: AC
  Administered 2019-03-29: 20 mg

## 2019-03-29 MED ORDER — TRAMADOL HCL 50 MG PO TABS: 50.0000 mg | ORAL_TABLET | Freq: Three times a day (TID) | ORAL | 0 refills | Status: AC | PRN

## 2019-03-29 NOTE — Progress Notes (Signed)
Subjective: Marie Chavez is a 63 y.o. female returns to office for follow up evaluation after Right heel injection for plantar fasciitis, injection #1 administered 4 weeks ago. Patient states that the injection only helped for 1 day. Patient reports pain is the same as before 8/10. Patient reports that she has to walk but can't due to pain. Patient denies any recent changes in medications or new problems since last visit.   Patient Active Problem List   Diagnosis Date Noted  . Mixed incontinence 12/18/2015  . Sensation of pressure in bladder area 12/18/2015  . Nocturia 12/18/2015  . Urine frequency 12/18/2015  . Lung cancer ([place]) 11/28/2013  . ACE-inhibitor cough 11/07/2013    Current Outpatient Medications on File Prior to Visit  Medication Sig Dispense Refill  . atenolol (TENORMIN) 50 MG tablet Take 50 mg by mouth daily.      . clonazePAM (KLONOPIN) 1 MG tablet Take 1 mg by mouth 2 (two) times daily as needed for anxiety.     Marland Kitchen EPIPEN 2-PAK 0.3 MG/0.3ML SOAJ injection Inject 0.3 mg as directed once. As needed    . escitalopram (LEXAPRO) 10 MG tablet Take 10 mg by mouth at bedtime.     Marland Kitchen estradiol (VIVELLE-DOT) 0.05 MG/24HR Place 1 patch onto the skin once a week. On Saturday    . fluticasone (FLONASE) 50 MCG/ACT nasal spray Place 2 sprays into both nostrils daily as needed for allergies.     Marland Kitchen gabapentin (NEURONTIN) 300 MG capsule Take 300 mg by mouth at bedtime as needed.    Marland Kitchen levothyroxine (SYNTHROID, LEVOTHROID) 175 MCG tablet     . lisinopril (PRINIVIL,ZESTRIL) 5 MG tablet Take 5 mg by mouth daily.     . mometasone (NASONEX) 50 MCG/ACT nasal spray Place 2 sprays into both nostrils daily.    . nabumetone (RELAFEN) 750 MG tablet Take 750 mg by mouth at bedtime as needed for mild pain.     Marland Kitchen PROAIR HFA 108 (90 BASE) MCG/ACT inhaler   0  . promethazine (PHENERGAN) 25 MG tablet Take 25 mg by mouth every 6 (six) hours as needed for nausea or vomiting.     No current  facility-administered medications on file prior to visit.     Allergies  Allergen Reactions  . Bee Venom Anaphylaxis and Other (See Comments)    Developed "blue rings" all over body after being stung  . Celebrex [Celecoxib]     REACTION: heart rate increases, itching  . Mobic [Meloxicam] Itching    HEART RACES  . Prednisone     REACTION: Heart rate increases, itching    Objective:   General:  Alert and oriented x 3, in no acute distress  Dermatology: Skin is warm, dry and supple bilateral lower extremities. Nails 1-10 are normal. There is no erythema, edema, no eccymosis, no open lesions present. Integument is otherwise unremarkable.  Vascular: Dorsalis Pedis pulse and Posterior Tibial pulse are 1/4 bilateral. Capillary fill time is immediate to all digits.  Neurological: Grossly intact to light touch with an achilles reflex of +2/5 and a negative Tinel's sign bilateral.  Musculoskeletal: Tenderness to palpation at the medial calcaneal tubercale and through the insertion of the plantar fascia on the right foot. No pain with compression of calcaneus bilateral. No pain with tuning fork to calcaneus bilateral. No pain with calf compression bilateral. There is decreased Ankle joint range of motion bilateral. + Midfoot bone spur bilateral. All other joints range of motion within normal limits bilateral. Strength  5/5 in all groups bilateral  Assessment and Plan: Problem List Items Addressed This Visit    None    Visit Diagnoses    Plantar fasciitis of right foot    -  Primary   Right foot pain         -Complete examination performed.  -Previous x-rays reviewed. -Discussed with patient in detail the condition of plantar fasciitis, how this  occurs related to the foot type of the patient and general treatment options. - Patient opted for another injection today; After oral consent and aseptic prep, injected a mixture containing 1 ml of 1%plain lidocaine, 1 ml 0.5% plain marcaine,  0.5 ml of kenalog 40 and 0.5 ml of dexmethasone phosphate to right heel at area of most pain/trigger point injection. -Dispensed Used CAM boot to patient  -Continue with stretching, icing, good supportive shoes, inserts daily.  -Discussed long term care and reocurrence; will closely monitor; if fails to improve will consider other treatment modalities.  -Patient to return to office in 1-2 weeks for follow up or sooner if problems or questions arise. If no improvement will require MRI and possible surgery.   Landis Martins, DPM

## 2019-04-02 ENCOUNTER — Telehealth: Payer: Self-pay | Admitting: *Deleted

## 2019-04-02 NOTE — Telephone Encounter (Signed)
Patient states that she can not wear the boot it is causing more pain than before.  I questioned shoe style for the other foot she states she is wearing a tennis shoe and does not have anything with a thicker sole.  She would like another option.

## 2019-04-02 NOTE — Telephone Encounter (Signed)
FLAT POST OP SHOE -DR. Breon Rehm

## 2019-04-03 NOTE — Telephone Encounter (Signed)
Left message for patient to come be fitted for a post op shoe

## 2019-04-04 ENCOUNTER — Ambulatory Visit: Payer: Medicare Other | Admitting: Sports Medicine

## 2019-04-13 ENCOUNTER — Ambulatory Visit: Payer: Medicare Other | Admitting: Sports Medicine

## 2019-04-13 DIAGNOSIS — Z0000 Encounter for general adult medical examination without abnormal findings: Secondary | ICD-10-CM | POA: Diagnosis not present

## 2019-04-13 DIAGNOSIS — E785 Hyperlipidemia, unspecified: Secondary | ICD-10-CM | POA: Diagnosis not present

## 2019-04-13 DIAGNOSIS — Z9181 History of falling: Secondary | ICD-10-CM | POA: Diagnosis not present

## 2019-04-13 DIAGNOSIS — Z1331 Encounter for screening for depression: Secondary | ICD-10-CM | POA: Diagnosis not present

## 2019-04-13 DIAGNOSIS — Z6839 Body mass index (BMI) 39.0-39.9, adult: Secondary | ICD-10-CM | POA: Diagnosis not present

## 2019-04-16 DIAGNOSIS — E782 Mixed hyperlipidemia: Secondary | ICD-10-CM | POA: Diagnosis not present

## 2019-04-16 DIAGNOSIS — I2699 Other pulmonary embolism without acute cor pulmonale: Secondary | ICD-10-CM | POA: Diagnosis not present

## 2019-04-16 DIAGNOSIS — G2581 Restless legs syndrome: Secondary | ICD-10-CM | POA: Diagnosis not present

## 2019-04-16 DIAGNOSIS — F3341 Major depressive disorder, recurrent, in partial remission: Secondary | ICD-10-CM | POA: Diagnosis not present

## 2019-04-18 ENCOUNTER — Ambulatory Visit: Payer: Medicare Other | Admitting: Sports Medicine

## 2019-04-20 DIAGNOSIS — R110 Nausea: Secondary | ICD-10-CM | POA: Diagnosis not present

## 2019-04-20 DIAGNOSIS — F419 Anxiety disorder, unspecified: Secondary | ICD-10-CM | POA: Diagnosis not present

## 2019-04-20 DIAGNOSIS — R1110 Vomiting, unspecified: Secondary | ICD-10-CM | POA: Diagnosis not present

## 2019-04-20 DIAGNOSIS — F410 Panic disorder [episodic paroxysmal anxiety] without agoraphobia: Secondary | ICD-10-CM | POA: Diagnosis not present

## 2019-04-23 DIAGNOSIS — F419 Anxiety disorder, unspecified: Secondary | ICD-10-CM | POA: Diagnosis not present

## 2019-04-23 DIAGNOSIS — I10 Essential (primary) hypertension: Secondary | ICD-10-CM | POA: Diagnosis not present

## 2019-04-23 DIAGNOSIS — F3341 Major depressive disorder, recurrent, in partial remission: Secondary | ICD-10-CM | POA: Diagnosis not present

## 2019-04-23 DIAGNOSIS — I2699 Other pulmonary embolism without acute cor pulmonale: Secondary | ICD-10-CM | POA: Diagnosis not present

## 2019-04-23 DIAGNOSIS — E782 Mixed hyperlipidemia: Secondary | ICD-10-CM | POA: Diagnosis not present

## 2019-04-25 ENCOUNTER — Ambulatory Visit: Payer: Medicare Other | Admitting: Sports Medicine

## 2019-04-26 ENCOUNTER — Other Ambulatory Visit: Payer: Self-pay

## 2019-04-26 ENCOUNTER — Ambulatory Visit (INDEPENDENT_AMBULATORY_CARE_PROVIDER_SITE_OTHER): Payer: Medicare Other | Admitting: Sports Medicine

## 2019-04-26 ENCOUNTER — Encounter: Payer: Self-pay | Admitting: Sports Medicine

## 2019-04-26 VITALS — Temp 97.7°F | Resp 16

## 2019-04-26 DIAGNOSIS — M79671 Pain in right foot: Secondary | ICD-10-CM | POA: Diagnosis not present

## 2019-04-26 DIAGNOSIS — M216X1 Other acquired deformities of right foot: Secondary | ICD-10-CM

## 2019-04-26 DIAGNOSIS — M722 Plantar fascial fibromatosis: Secondary | ICD-10-CM

## 2019-04-26 NOTE — Progress Notes (Signed)
Subjective: Marie Chavez is a 63 y.o. female returns to office for follow up evaluation after Left heel injection for plantar fasciitis, injection #2 administered 4 weeks ago. Patient states that the injection seems to help her pain; pain is now 5/10 and has  decreased in frequency to the area.Still sore with some shoes reports that she could not wear the boot due to back problems. Patient denies any recent changes in medications or new problems since last visit.   Patient Active Problem List   Diagnosis Date Noted  . Mixed incontinence 12/18/2015  . Sensation of pressure in bladder area 12/18/2015  . Nocturia 12/18/2015  . Urine frequency 12/18/2015  . Lung cancer ([place]) 11/28/2013  . ACE-inhibitor cough 11/07/2013    Current Outpatient Medications on File Prior to Visit  Medication Sig Dispense Refill  . atenolol (TENORMIN) 50 MG tablet Take 50 mg by mouth daily.      . clonazePAM (KLONOPIN) 1 MG tablet Take 1 mg by mouth 2 (two) times daily as needed for anxiety.     Marland Kitchen EPIPEN 2-PAK 0.3 MG/0.3ML SOAJ injection Inject 0.3 mg as directed once. As needed    . escitalopram (LEXAPRO) 10 MG tablet Take 10 mg by mouth at bedtime.     Marland Kitchen estradiol (VIVELLE-DOT) 0.05 MG/24HR Place 1 patch onto the skin once a week. On Saturday    . fluticasone (FLONASE) 50 MCG/ACT nasal spray Place 2 sprays into both nostrils daily as needed for allergies.     Marland Kitchen gabapentin (NEURONTIN) 300 MG capsule Take 300 mg by mouth at bedtime as needed.    Marland Kitchen levothyroxine (SYNTHROID, LEVOTHROID) 175 MCG tablet     . lisinopril (PRINIVIL,ZESTRIL) 5 MG tablet Take 5 mg by mouth daily.     . mometasone (NASONEX) 50 MCG/ACT nasal spray Place 2 sprays into both nostrils daily.    . nabumetone (RELAFEN) 750 MG tablet Take 750 mg by mouth at bedtime as needed for mild pain.     Marland Kitchen PROAIR HFA 108 (90 BASE) MCG/ACT inhaler   0  . promethazine (PHENERGAN) 25 MG tablet Take 25 mg by mouth every 6 (six) hours as needed for nausea or  vomiting.     No current facility-administered medications on file prior to visit.     Allergies  Allergen Reactions  . Bee Venom Anaphylaxis and Other (See Comments)    Developed "blue rings" all over body after being stung  . Celebrex [Celecoxib]     REACTION: heart rate increases, itching  . Mobic [Meloxicam] Itching    HEART RACES  . Prednisone     REACTION: Heart rate increases, itching    Objective:   General:  Alert and oriented x 3, in no acute distress  Dermatology: Skin is warm, dry, and supple bilateral. Nails are within normal limits. There is no lower extremity erythema, no eccymosis, no open lesions present bilateral.   Vascular: Dorsalis Pedis and Posterior Tibial pedal pulses are 1/4 bilateral. + hair growth noted bilateral. Capillary Fill Time is 3 seconds in all digits. No varicosities, No edema bilateral lower extremities.   Neurological: Sensation grossly intact to light touch bilateral.  Musculoskeletal: There is decreased tenderness to palpation at the medial calcaneal tubercale and through the insertion of the plantar fascia on the right foot. No pain with compression to calcaneus or application of tuning fork. There is decreased Ankle joint range of motion bilateral. All other joints range of motion within normal limits bilateral. Strength 5/5 bilateral.  Assessment and Plan: Problem List Items Addressed This Visit    None    Visit Diagnoses    Plantar fasciitis of right foot    -  Primary   Right foot pain       Acquired equinus deformity of right foot         -Complete examination performed.  -Previous x-rays reviewed. -Discussed with patient in detail the condition of plantar fasciitis, how this  occurs related to the foot type of the patient and general treatment options. -Dispensed right heel padding  -Continue with stretching, icing, good supportive shoes daily.  -Discussed long term care and reocurrence; will closely monitor; if fails to  improve will consider other treatment modalities.  -Patient to return to office PRN or sooner if problems or questions arise. Advised patient if flares may require another injection.   Landis Martins, DPM

## 2019-04-30 DIAGNOSIS — F3341 Major depressive disorder, recurrent, in partial remission: Secondary | ICD-10-CM | POA: Diagnosis not present

## 2019-04-30 DIAGNOSIS — I2699 Other pulmonary embolism without acute cor pulmonale: Secondary | ICD-10-CM | POA: Diagnosis not present

## 2019-04-30 DIAGNOSIS — E782 Mixed hyperlipidemia: Secondary | ICD-10-CM | POA: Diagnosis not present

## 2019-04-30 DIAGNOSIS — I10 Essential (primary) hypertension: Secondary | ICD-10-CM | POA: Diagnosis not present

## 2019-05-04 DIAGNOSIS — E782 Mixed hyperlipidemia: Secondary | ICD-10-CM | POA: Diagnosis not present

## 2019-05-04 DIAGNOSIS — F3341 Major depressive disorder, recurrent, in partial remission: Secondary | ICD-10-CM | POA: Diagnosis not present

## 2019-05-04 DIAGNOSIS — I10 Essential (primary) hypertension: Secondary | ICD-10-CM | POA: Diagnosis not present

## 2019-05-04 DIAGNOSIS — I2699 Other pulmonary embolism without acute cor pulmonale: Secondary | ICD-10-CM | POA: Diagnosis not present

## 2019-05-14 DIAGNOSIS — I2699 Other pulmonary embolism without acute cor pulmonale: Secondary | ICD-10-CM | POA: Diagnosis not present

## 2019-05-14 DIAGNOSIS — I10 Essential (primary) hypertension: Secondary | ICD-10-CM | POA: Diagnosis not present

## 2019-05-14 DIAGNOSIS — E782 Mixed hyperlipidemia: Secondary | ICD-10-CM | POA: Diagnosis not present

## 2019-05-14 DIAGNOSIS — F3341 Major depressive disorder, recurrent, in partial remission: Secondary | ICD-10-CM | POA: Diagnosis not present

## 2019-05-21 DIAGNOSIS — E782 Mixed hyperlipidemia: Secondary | ICD-10-CM | POA: Diagnosis not present

## 2019-05-21 DIAGNOSIS — I10 Essential (primary) hypertension: Secondary | ICD-10-CM | POA: Diagnosis not present

## 2019-05-21 DIAGNOSIS — I2699 Other pulmonary embolism without acute cor pulmonale: Secondary | ICD-10-CM | POA: Diagnosis not present

## 2019-05-21 DIAGNOSIS — F3341 Major depressive disorder, recurrent, in partial remission: Secondary | ICD-10-CM | POA: Diagnosis not present

## 2019-05-28 DIAGNOSIS — S299XXA Unspecified injury of thorax, initial encounter: Secondary | ICD-10-CM | POA: Diagnosis not present

## 2019-05-28 DIAGNOSIS — Z7901 Long term (current) use of anticoagulants: Secondary | ICD-10-CM | POA: Diagnosis not present

## 2019-05-28 DIAGNOSIS — R079 Chest pain, unspecified: Secondary | ICD-10-CM | POA: Diagnosis not present

## 2019-05-28 DIAGNOSIS — Z79899 Other long term (current) drug therapy: Secondary | ICD-10-CM | POA: Diagnosis not present

## 2019-05-28 DIAGNOSIS — R0781 Pleurodynia: Secondary | ICD-10-CM | POA: Diagnosis not present

## 2019-05-28 DIAGNOSIS — S99911A Unspecified injury of right ankle, initial encounter: Secondary | ICD-10-CM | POA: Diagnosis not present

## 2019-05-28 DIAGNOSIS — S93401A Sprain of unspecified ligament of right ankle, initial encounter: Secondary | ICD-10-CM | POA: Diagnosis not present

## 2019-05-28 DIAGNOSIS — I10 Essential (primary) hypertension: Secondary | ICD-10-CM | POA: Diagnosis not present

## 2019-05-28 DIAGNOSIS — M1990 Unspecified osteoarthritis, unspecified site: Secondary | ICD-10-CM | POA: Diagnosis not present

## 2019-05-28 DIAGNOSIS — M25571 Pain in right ankle and joints of right foot: Secondary | ICD-10-CM | POA: Diagnosis not present

## 2019-05-28 DIAGNOSIS — Z7952 Long term (current) use of systemic steroids: Secondary | ICD-10-CM | POA: Diagnosis not present

## 2019-05-28 DIAGNOSIS — E039 Hypothyroidism, unspecified: Secondary | ICD-10-CM | POA: Diagnosis not present

## 2019-05-28 DIAGNOSIS — Z87891 Personal history of nicotine dependence: Secondary | ICD-10-CM | POA: Diagnosis not present

## 2019-05-28 DIAGNOSIS — Z792 Long term (current) use of antibiotics: Secondary | ICD-10-CM | POA: Diagnosis not present

## 2019-05-30 DIAGNOSIS — I10 Essential (primary) hypertension: Secondary | ICD-10-CM | POA: Diagnosis not present

## 2019-05-30 DIAGNOSIS — I2699 Other pulmonary embolism without acute cor pulmonale: Secondary | ICD-10-CM | POA: Diagnosis not present

## 2019-05-30 DIAGNOSIS — J0100 Acute maxillary sinusitis, unspecified: Secondary | ICD-10-CM | POA: Diagnosis not present

## 2019-05-30 DIAGNOSIS — E782 Mixed hyperlipidemia: Secondary | ICD-10-CM | POA: Diagnosis not present

## 2019-06-01 DIAGNOSIS — E782 Mixed hyperlipidemia: Secondary | ICD-10-CM | POA: Diagnosis not present

## 2019-06-01 DIAGNOSIS — I2699 Other pulmonary embolism without acute cor pulmonale: Secondary | ICD-10-CM | POA: Diagnosis not present

## 2019-06-01 DIAGNOSIS — I10 Essential (primary) hypertension: Secondary | ICD-10-CM | POA: Diagnosis not present

## 2019-06-01 DIAGNOSIS — J0100 Acute maxillary sinusitis, unspecified: Secondary | ICD-10-CM | POA: Diagnosis not present

## 2019-06-05 DIAGNOSIS — M19071 Primary osteoarthritis, right ankle and foot: Secondary | ICD-10-CM | POA: Diagnosis not present

## 2019-06-05 DIAGNOSIS — M722 Plantar fascial fibromatosis: Secondary | ICD-10-CM | POA: Diagnosis not present

## 2019-06-20 DIAGNOSIS — F331 Major depressive disorder, recurrent, moderate: Secondary | ICD-10-CM | POA: Diagnosis not present

## 2019-06-21 DIAGNOSIS — F3341 Major depressive disorder, recurrent, in partial remission: Secondary | ICD-10-CM | POA: Diagnosis not present

## 2019-06-21 DIAGNOSIS — I10 Essential (primary) hypertension: Secondary | ICD-10-CM | POA: Diagnosis not present

## 2019-06-21 DIAGNOSIS — E782 Mixed hyperlipidemia: Secondary | ICD-10-CM | POA: Diagnosis not present

## 2019-06-21 DIAGNOSIS — K529 Noninfective gastroenteritis and colitis, unspecified: Secondary | ICD-10-CM | POA: Diagnosis not present

## 2019-06-24 DIAGNOSIS — R1084 Generalized abdominal pain: Secondary | ICD-10-CM | POA: Diagnosis not present

## 2019-06-24 DIAGNOSIS — R197 Diarrhea, unspecified: Secondary | ICD-10-CM | POA: Diagnosis not present

## 2019-06-24 DIAGNOSIS — R112 Nausea with vomiting, unspecified: Secondary | ICD-10-CM | POA: Diagnosis not present

## 2019-06-24 DIAGNOSIS — R1110 Vomiting, unspecified: Secondary | ICD-10-CM | POA: Diagnosis not present

## 2019-06-25 DIAGNOSIS — I10 Essential (primary) hypertension: Secondary | ICD-10-CM | POA: Diagnosis not present

## 2019-06-25 DIAGNOSIS — F3341 Major depressive disorder, recurrent, in partial remission: Secondary | ICD-10-CM | POA: Diagnosis not present

## 2019-06-25 DIAGNOSIS — K529 Noninfective gastroenteritis and colitis, unspecified: Secondary | ICD-10-CM | POA: Diagnosis not present

## 2019-06-25 DIAGNOSIS — E782 Mixed hyperlipidemia: Secondary | ICD-10-CM | POA: Diagnosis not present

## 2019-07-04 DIAGNOSIS — G4733 Obstructive sleep apnea (adult) (pediatric): Secondary | ICD-10-CM | POA: Diagnosis not present

## 2019-07-04 DIAGNOSIS — E662 Morbid (severe) obesity with alveolar hypoventilation: Secondary | ICD-10-CM | POA: Diagnosis not present

## 2019-07-04 DIAGNOSIS — R5383 Other fatigue: Secondary | ICD-10-CM | POA: Diagnosis not present

## 2019-07-04 DIAGNOSIS — I2699 Other pulmonary embolism without acute cor pulmonale: Secondary | ICD-10-CM | POA: Diagnosis not present

## 2019-07-04 DIAGNOSIS — J4540 Moderate persistent asthma, uncomplicated: Secondary | ICD-10-CM | POA: Diagnosis not present

## 2019-07-05 DIAGNOSIS — I10 Essential (primary) hypertension: Secondary | ICD-10-CM | POA: Diagnosis not present

## 2019-07-05 DIAGNOSIS — I2699 Other pulmonary embolism without acute cor pulmonale: Secondary | ICD-10-CM | POA: Diagnosis not present

## 2019-07-05 DIAGNOSIS — F3341 Major depressive disorder, recurrent, in partial remission: Secondary | ICD-10-CM | POA: Diagnosis not present

## 2019-07-05 DIAGNOSIS — E782 Mixed hyperlipidemia: Secondary | ICD-10-CM | POA: Diagnosis not present

## 2019-07-19 DIAGNOSIS — J028 Acute pharyngitis due to other specified organisms: Secondary | ICD-10-CM | POA: Diagnosis not present

## 2019-07-19 DIAGNOSIS — E782 Mixed hyperlipidemia: Secondary | ICD-10-CM | POA: Diagnosis not present

## 2019-07-19 DIAGNOSIS — F3341 Major depressive disorder, recurrent, in partial remission: Secondary | ICD-10-CM | POA: Diagnosis not present

## 2019-07-19 DIAGNOSIS — I2699 Other pulmonary embolism without acute cor pulmonale: Secondary | ICD-10-CM | POA: Diagnosis not present

## 2019-08-06 DIAGNOSIS — E782 Mixed hyperlipidemia: Secondary | ICD-10-CM | POA: Diagnosis not present

## 2019-08-06 DIAGNOSIS — F3341 Major depressive disorder, recurrent, in partial remission: Secondary | ICD-10-CM | POA: Diagnosis not present

## 2019-08-06 DIAGNOSIS — L309 Dermatitis, unspecified: Secondary | ICD-10-CM | POA: Diagnosis not present

## 2019-08-06 DIAGNOSIS — I10 Essential (primary) hypertension: Secondary | ICD-10-CM | POA: Diagnosis not present

## 2019-08-10 DIAGNOSIS — I10 Essential (primary) hypertension: Secondary | ICD-10-CM | POA: Diagnosis not present

## 2019-08-10 DIAGNOSIS — I2699 Other pulmonary embolism without acute cor pulmonale: Secondary | ICD-10-CM | POA: Diagnosis not present

## 2019-08-10 DIAGNOSIS — F3341 Major depressive disorder, recurrent, in partial remission: Secondary | ICD-10-CM | POA: Diagnosis not present

## 2019-08-10 DIAGNOSIS — E782 Mixed hyperlipidemia: Secondary | ICD-10-CM | POA: Diagnosis not present

## 2019-08-19 DIAGNOSIS — B80 Enterobiasis: Secondary | ICD-10-CM | POA: Diagnosis not present

## 2019-08-27 DIAGNOSIS — E039 Hypothyroidism, unspecified: Secondary | ICD-10-CM | POA: Diagnosis not present

## 2019-08-27 DIAGNOSIS — I10 Essential (primary) hypertension: Secondary | ICD-10-CM | POA: Diagnosis not present

## 2019-08-27 DIAGNOSIS — J309 Allergic rhinitis, unspecified: Secondary | ICD-10-CM | POA: Diagnosis not present

## 2019-08-27 DIAGNOSIS — E782 Mixed hyperlipidemia: Secondary | ICD-10-CM | POA: Diagnosis not present

## 2019-08-27 DIAGNOSIS — F3341 Major depressive disorder, recurrent, in partial remission: Secondary | ICD-10-CM | POA: Diagnosis not present

## 2019-08-28 DIAGNOSIS — Z79899 Other long term (current) drug therapy: Secondary | ICD-10-CM | POA: Diagnosis not present

## 2019-08-28 DIAGNOSIS — E039 Hypothyroidism, unspecified: Secondary | ICD-10-CM | POA: Diagnosis not present

## 2019-08-28 DIAGNOSIS — Z7952 Long term (current) use of systemic steroids: Secondary | ICD-10-CM | POA: Diagnosis not present

## 2019-08-28 DIAGNOSIS — R0602 Shortness of breath: Secondary | ICD-10-CM | POA: Diagnosis not present

## 2019-08-28 DIAGNOSIS — R438 Other disturbances of smell and taste: Secondary | ICD-10-CM | POA: Diagnosis not present

## 2019-08-28 DIAGNOSIS — M1990 Unspecified osteoarthritis, unspecified site: Secondary | ICD-10-CM | POA: Diagnosis not present

## 2019-08-28 DIAGNOSIS — I10 Essential (primary) hypertension: Secondary | ICD-10-CM | POA: Diagnosis not present

## 2019-08-28 DIAGNOSIS — B349 Viral infection, unspecified: Secondary | ICD-10-CM | POA: Diagnosis not present

## 2019-08-28 DIAGNOSIS — Z7901 Long term (current) use of anticoagulants: Secondary | ICD-10-CM | POA: Diagnosis not present

## 2019-09-05 DIAGNOSIS — R0602 Shortness of breath: Secondary | ICD-10-CM | POA: Diagnosis not present

## 2019-09-05 DIAGNOSIS — M79662 Pain in left lower leg: Secondary | ICD-10-CM | POA: Diagnosis not present

## 2019-09-05 DIAGNOSIS — R600 Localized edema: Secondary | ICD-10-CM | POA: Diagnosis not present

## 2019-09-05 DIAGNOSIS — M79661 Pain in right lower leg: Secondary | ICD-10-CM | POA: Diagnosis not present

## 2019-09-05 DIAGNOSIS — M7989 Other specified soft tissue disorders: Secondary | ICD-10-CM | POA: Diagnosis not present

## 2019-09-05 DIAGNOSIS — R609 Edema, unspecified: Secondary | ICD-10-CM | POA: Diagnosis not present

## 2019-09-10 DIAGNOSIS — E039 Hypothyroidism, unspecified: Secondary | ICD-10-CM | POA: Diagnosis not present

## 2019-09-10 DIAGNOSIS — Z23 Encounter for immunization: Secondary | ICD-10-CM | POA: Diagnosis not present

## 2019-09-10 DIAGNOSIS — R600 Localized edema: Secondary | ICD-10-CM | POA: Diagnosis not present

## 2019-09-10 DIAGNOSIS — G2581 Restless legs syndrome: Secondary | ICD-10-CM | POA: Diagnosis not present

## 2019-09-10 DIAGNOSIS — J309 Allergic rhinitis, unspecified: Secondary | ICD-10-CM | POA: Diagnosis not present

## 2019-09-17 DIAGNOSIS — K611 Rectal abscess: Secondary | ICD-10-CM | POA: Diagnosis not present

## 2019-09-17 DIAGNOSIS — K649 Unspecified hemorrhoids: Secondary | ICD-10-CM | POA: Diagnosis not present

## 2019-09-25 DIAGNOSIS — E039 Hypothyroidism, unspecified: Secondary | ICD-10-CM | POA: Diagnosis not present

## 2019-09-25 DIAGNOSIS — M25572 Pain in left ankle and joints of left foot: Secondary | ICD-10-CM | POA: Diagnosis not present

## 2019-09-25 DIAGNOSIS — J309 Allergic rhinitis, unspecified: Secondary | ICD-10-CM | POA: Diagnosis not present

## 2019-09-25 DIAGNOSIS — R5382 Chronic fatigue, unspecified: Secondary | ICD-10-CM | POA: Diagnosis not present

## 2019-10-04 DIAGNOSIS — J309 Allergic rhinitis, unspecified: Secondary | ICD-10-CM | POA: Diagnosis not present

## 2019-10-04 DIAGNOSIS — I10 Essential (primary) hypertension: Secondary | ICD-10-CM | POA: Diagnosis not present

## 2019-10-04 DIAGNOSIS — F3341 Major depressive disorder, recurrent, in partial remission: Secondary | ICD-10-CM | POA: Diagnosis not present

## 2019-10-04 DIAGNOSIS — E039 Hypothyroidism, unspecified: Secondary | ICD-10-CM | POA: Diagnosis not present

## 2019-10-30 DIAGNOSIS — E662 Morbid (severe) obesity with alveolar hypoventilation: Secondary | ICD-10-CM | POA: Diagnosis not present

## 2019-10-30 DIAGNOSIS — G4733 Obstructive sleep apnea (adult) (pediatric): Secondary | ICD-10-CM | POA: Diagnosis not present

## 2019-10-30 DIAGNOSIS — R5383 Other fatigue: Secondary | ICD-10-CM | POA: Diagnosis not present

## 2019-10-30 DIAGNOSIS — I10 Essential (primary) hypertension: Secondary | ICD-10-CM | POA: Diagnosis not present

## 2019-10-30 DIAGNOSIS — E039 Hypothyroidism, unspecified: Secondary | ICD-10-CM | POA: Diagnosis not present

## 2019-10-30 DIAGNOSIS — J4540 Moderate persistent asthma, uncomplicated: Secondary | ICD-10-CM | POA: Diagnosis not present

## 2019-11-02 DIAGNOSIS — F331 Major depressive disorder, recurrent, moderate: Secondary | ICD-10-CM | POA: Diagnosis not present

## 2019-11-13 DIAGNOSIS — E782 Mixed hyperlipidemia: Secondary | ICD-10-CM | POA: Diagnosis not present

## 2019-11-13 DIAGNOSIS — J309 Allergic rhinitis, unspecified: Secondary | ICD-10-CM | POA: Diagnosis not present

## 2019-11-13 DIAGNOSIS — M25572 Pain in left ankle and joints of left foot: Secondary | ICD-10-CM | POA: Diagnosis not present

## 2019-11-13 DIAGNOSIS — F3341 Major depressive disorder, recurrent, in partial remission: Secondary | ICD-10-CM | POA: Diagnosis not present

## 2019-11-21 DIAGNOSIS — E782 Mixed hyperlipidemia: Secondary | ICD-10-CM | POA: Diagnosis not present

## 2019-11-21 DIAGNOSIS — F3341 Major depressive disorder, recurrent, in partial remission: Secondary | ICD-10-CM | POA: Diagnosis not present

## 2019-11-21 DIAGNOSIS — M25572 Pain in left ankle and joints of left foot: Secondary | ICD-10-CM | POA: Diagnosis not present

## 2019-11-21 DIAGNOSIS — I10 Essential (primary) hypertension: Secondary | ICD-10-CM | POA: Diagnosis not present

## 2019-11-21 DIAGNOSIS — J309 Allergic rhinitis, unspecified: Secondary | ICD-10-CM | POA: Diagnosis not present

## 2019-12-04 DIAGNOSIS — F3341 Major depressive disorder, recurrent, in partial remission: Secondary | ICD-10-CM | POA: Diagnosis not present

## 2019-12-04 DIAGNOSIS — E782 Mixed hyperlipidemia: Secondary | ICD-10-CM | POA: Diagnosis not present

## 2019-12-04 DIAGNOSIS — M25571 Pain in right ankle and joints of right foot: Secondary | ICD-10-CM | POA: Diagnosis not present

## 2019-12-04 DIAGNOSIS — J309 Allergic rhinitis, unspecified: Secondary | ICD-10-CM | POA: Diagnosis not present

## 2019-12-11 DIAGNOSIS — J4540 Moderate persistent asthma, uncomplicated: Secondary | ICD-10-CM | POA: Diagnosis not present

## 2019-12-11 DIAGNOSIS — M659 Synovitis and tenosynovitis, unspecified: Secondary | ICD-10-CM | POA: Diagnosis not present

## 2019-12-11 DIAGNOSIS — E782 Mixed hyperlipidemia: Secondary | ICD-10-CM | POA: Diagnosis not present

## 2019-12-11 DIAGNOSIS — J309 Allergic rhinitis, unspecified: Secondary | ICD-10-CM | POA: Diagnosis not present

## 2019-12-11 DIAGNOSIS — I2699 Other pulmonary embolism without acute cor pulmonale: Secondary | ICD-10-CM | POA: Diagnosis not present

## 2019-12-19 DIAGNOSIS — R002 Palpitations: Secondary | ICD-10-CM | POA: Diagnosis not present

## 2019-12-19 DIAGNOSIS — M659 Synovitis and tenosynovitis, unspecified: Secondary | ICD-10-CM | POA: Diagnosis not present

## 2019-12-19 DIAGNOSIS — J309 Allergic rhinitis, unspecified: Secondary | ICD-10-CM | POA: Diagnosis not present

## 2019-12-19 DIAGNOSIS — E782 Mixed hyperlipidemia: Secondary | ICD-10-CM | POA: Diagnosis not present

## 2019-12-24 DIAGNOSIS — J309 Allergic rhinitis, unspecified: Secondary | ICD-10-CM | POA: Diagnosis not present

## 2019-12-24 DIAGNOSIS — I471 Supraventricular tachycardia: Secondary | ICD-10-CM | POA: Diagnosis not present

## 2019-12-24 DIAGNOSIS — I2699 Other pulmonary embolism without acute cor pulmonale: Secondary | ICD-10-CM | POA: Diagnosis not present

## 2019-12-24 DIAGNOSIS — E782 Mixed hyperlipidemia: Secondary | ICD-10-CM | POA: Diagnosis not present

## 2019-12-28 DIAGNOSIS — M25561 Pain in right knee: Secondary | ICD-10-CM | POA: Diagnosis not present

## 2019-12-28 DIAGNOSIS — M79661 Pain in right lower leg: Secondary | ICD-10-CM | POA: Diagnosis not present

## 2019-12-28 DIAGNOSIS — M79604 Pain in right leg: Secondary | ICD-10-CM | POA: Diagnosis not present

## 2020-01-01 DIAGNOSIS — M159 Polyosteoarthritis, unspecified: Secondary | ICD-10-CM | POA: Diagnosis not present

## 2020-01-01 DIAGNOSIS — J309 Allergic rhinitis, unspecified: Secondary | ICD-10-CM | POA: Diagnosis not present

## 2020-01-01 DIAGNOSIS — I471 Supraventricular tachycardia: Secondary | ICD-10-CM | POA: Diagnosis not present

## 2020-01-01 DIAGNOSIS — R5383 Other fatigue: Secondary | ICD-10-CM | POA: Diagnosis not present

## 2020-01-09 DIAGNOSIS — I471 Supraventricular tachycardia: Secondary | ICD-10-CM | POA: Diagnosis not present

## 2020-01-09 DIAGNOSIS — K529 Noninfective gastroenteritis and colitis, unspecified: Secondary | ICD-10-CM | POA: Diagnosis not present

## 2020-01-09 DIAGNOSIS — I10 Essential (primary) hypertension: Secondary | ICD-10-CM | POA: Diagnosis not present

## 2020-01-09 DIAGNOSIS — J309 Allergic rhinitis, unspecified: Secondary | ICD-10-CM | POA: Diagnosis not present

## 2020-01-16 DIAGNOSIS — M659 Synovitis and tenosynovitis, unspecified: Secondary | ICD-10-CM | POA: Diagnosis not present

## 2020-01-16 DIAGNOSIS — E782 Mixed hyperlipidemia: Secondary | ICD-10-CM | POA: Diagnosis not present

## 2020-01-16 DIAGNOSIS — J309 Allergic rhinitis, unspecified: Secondary | ICD-10-CM | POA: Diagnosis not present

## 2020-01-16 DIAGNOSIS — I471 Supraventricular tachycardia: Secondary | ICD-10-CM | POA: Diagnosis not present

## 2020-01-21 ENCOUNTER — Encounter: Payer: Self-pay | Admitting: Gastroenterology

## 2020-01-21 DIAGNOSIS — G4733 Obstructive sleep apnea (adult) (pediatric): Secondary | ICD-10-CM | POA: Diagnosis not present

## 2020-01-21 DIAGNOSIS — Z1231 Encounter for screening mammogram for malignant neoplasm of breast: Secondary | ICD-10-CM | POA: Diagnosis not present

## 2020-01-21 DIAGNOSIS — J4540 Moderate persistent asthma, uncomplicated: Secondary | ICD-10-CM | POA: Diagnosis not present

## 2020-01-21 DIAGNOSIS — R5383 Other fatigue: Secondary | ICD-10-CM | POA: Diagnosis not present

## 2020-01-21 DIAGNOSIS — E662 Morbid (severe) obesity with alveolar hypoventilation: Secondary | ICD-10-CM | POA: Diagnosis not present

## 2020-01-22 DIAGNOSIS — R0602 Shortness of breath: Secondary | ICD-10-CM | POA: Diagnosis not present

## 2020-01-22 DIAGNOSIS — I2699 Other pulmonary embolism without acute cor pulmonale: Secondary | ICD-10-CM | POA: Diagnosis not present

## 2020-01-23 DIAGNOSIS — Z03818 Encounter for observation for suspected exposure to other biological agents ruled out: Secondary | ICD-10-CM | POA: Diagnosis not present

## 2020-01-23 DIAGNOSIS — M659 Synovitis and tenosynovitis, unspecified: Secondary | ICD-10-CM | POA: Diagnosis not present

## 2020-01-23 DIAGNOSIS — E782 Mixed hyperlipidemia: Secondary | ICD-10-CM | POA: Diagnosis not present

## 2020-01-23 DIAGNOSIS — J309 Allergic rhinitis, unspecified: Secondary | ICD-10-CM | POA: Diagnosis not present

## 2020-01-23 DIAGNOSIS — I471 Supraventricular tachycardia: Secondary | ICD-10-CM | POA: Diagnosis not present

## 2020-02-01 DIAGNOSIS — F331 Major depressive disorder, recurrent, moderate: Secondary | ICD-10-CM | POA: Diagnosis not present

## 2020-02-06 DIAGNOSIS — M542 Cervicalgia: Secondary | ICD-10-CM | POA: Diagnosis not present

## 2020-02-06 DIAGNOSIS — S0093XA Contusion of unspecified part of head, initial encounter: Secondary | ICD-10-CM | POA: Diagnosis not present

## 2020-02-06 DIAGNOSIS — M545 Low back pain: Secondary | ICD-10-CM | POA: Diagnosis not present

## 2020-02-07 DIAGNOSIS — S99921A Unspecified injury of right foot, initial encounter: Secondary | ICD-10-CM | POA: Diagnosis not present

## 2020-02-07 DIAGNOSIS — M542 Cervicalgia: Secondary | ICD-10-CM | POA: Diagnosis not present

## 2020-02-07 DIAGNOSIS — M7989 Other specified soft tissue disorders: Secondary | ICD-10-CM | POA: Diagnosis not present

## 2020-02-13 ENCOUNTER — Telehealth: Payer: Self-pay | Admitting: *Deleted

## 2020-02-13 DIAGNOSIS — E785 Hyperlipidemia, unspecified: Secondary | ICD-10-CM | POA: Diagnosis not present

## 2020-02-13 DIAGNOSIS — I10 Essential (primary) hypertension: Secondary | ICD-10-CM | POA: Diagnosis not present

## 2020-02-13 DIAGNOSIS — R002 Palpitations: Secondary | ICD-10-CM | POA: Insufficient documentation

## 2020-02-13 DIAGNOSIS — R0602 Shortness of breath: Secondary | ICD-10-CM | POA: Diagnosis not present

## 2020-02-13 DIAGNOSIS — R609 Edema, unspecified: Secondary | ICD-10-CM | POA: Insufficient documentation

## 2020-02-13 DIAGNOSIS — G4733 Obstructive sleep apnea (adult) (pediatric): Secondary | ICD-10-CM | POA: Diagnosis not present

## 2020-02-13 DIAGNOSIS — R072 Precordial pain: Secondary | ICD-10-CM | POA: Insufficient documentation

## 2020-02-13 DIAGNOSIS — R9431 Abnormal electrocardiogram [ECG] [EKG]: Secondary | ICD-10-CM | POA: Diagnosis not present

## 2020-02-13 DIAGNOSIS — Z6841 Body Mass Index (BMI) 40.0 and over, adult: Secondary | ICD-10-CM | POA: Insufficient documentation

## 2020-02-13 DIAGNOSIS — Z9989 Dependence on other enabling machines and devices: Secondary | ICD-10-CM | POA: Diagnosis not present

## 2020-02-13 DIAGNOSIS — R0600 Dyspnea, unspecified: Secondary | ICD-10-CM | POA: Diagnosis not present

## 2020-02-13 NOTE — Telephone Encounter (Signed)
Dr Lyndel Safe,  This pt had a colon with you 12-31-2011- she had tics, hems and 2 polyps in the rectum- both polyps HPP- she had a good prep with Miralax- she is scheduled for a recall colon 03-06-20 and a PV 4-29- she has a hx of lung cancer, PONV, Tachycardia, HTN.  Does she need a recall colon now or 2023?  Please advise, Thanks,Marie PV

## 2020-02-13 NOTE — Telephone Encounter (Signed)
She also has a cardiac referral out for Dyspnea- should she wait until she has cardiac clearance as well??  Marie Chavez

## 2020-02-19 NOTE — Telephone Encounter (Signed)
Lets do recall colon March 2023 If she is having any GI problems or if she has family history of colonic polyps, then lets do colonoscopy after cardiology clearance.  RG

## 2020-02-20 DIAGNOSIS — J4540 Moderate persistent asthma, uncomplicated: Secondary | ICD-10-CM | POA: Diagnosis not present

## 2020-02-20 DIAGNOSIS — G4733 Obstructive sleep apnea (adult) (pediatric): Secondary | ICD-10-CM | POA: Diagnosis not present

## 2020-02-20 DIAGNOSIS — E662 Morbid (severe) obesity with alveolar hypoventilation: Secondary | ICD-10-CM | POA: Diagnosis not present

## 2020-02-20 DIAGNOSIS — R5383 Other fatigue: Secondary | ICD-10-CM | POA: Diagnosis not present

## 2020-02-20 NOTE — Telephone Encounter (Signed)
Spoke with the patient's husband Fritz Pickerel. Patient was unavailable at this time. I explained that her colonoscopy is not due at this time per Dr.Gupta, colon due March 2023. I asked if she is having any GI concerns or problems and he denies any, and denies any family hx colon cancer or polyps. I explained to him to let her know this and if she is having any concerns to call us back to make an office visit with Dr.Gupta (per chart pt is on oxygen) . Husband verbalizes understanding.   Colon recall placed for 2023, colonoscopy and PV cancelled at this time.

## 2020-02-22 DIAGNOSIS — E039 Hypothyroidism, unspecified: Secondary | ICD-10-CM | POA: Diagnosis not present

## 2020-02-22 DIAGNOSIS — E669 Obesity, unspecified: Secondary | ICD-10-CM | POA: Diagnosis not present

## 2020-02-22 DIAGNOSIS — R221 Localized swelling, mass and lump, neck: Secondary | ICD-10-CM | POA: Diagnosis not present

## 2020-02-22 DIAGNOSIS — I10 Essential (primary) hypertension: Secondary | ICD-10-CM | POA: Diagnosis not present

## 2020-02-22 DIAGNOSIS — R600 Localized edema: Secondary | ICD-10-CM | POA: Diagnosis not present

## 2020-02-22 DIAGNOSIS — E049 Nontoxic goiter, unspecified: Secondary | ICD-10-CM | POA: Diagnosis not present

## 2020-02-22 DIAGNOSIS — R5383 Other fatigue: Secondary | ICD-10-CM | POA: Diagnosis not present

## 2020-02-26 DIAGNOSIS — R0602 Shortness of breath: Secondary | ICD-10-CM | POA: Diagnosis not present

## 2020-02-26 DIAGNOSIS — R002 Palpitations: Secondary | ICD-10-CM | POA: Diagnosis not present

## 2020-02-26 DIAGNOSIS — E785 Hyperlipidemia, unspecified: Secondary | ICD-10-CM | POA: Diagnosis not present

## 2020-02-26 DIAGNOSIS — R0600 Dyspnea, unspecified: Secondary | ICD-10-CM | POA: Diagnosis not present

## 2020-02-26 DIAGNOSIS — R609 Edema, unspecified: Secondary | ICD-10-CM | POA: Diagnosis not present

## 2020-03-04 DIAGNOSIS — S99912A Unspecified injury of left ankle, initial encounter: Secondary | ICD-10-CM | POA: Diagnosis not present

## 2020-03-04 DIAGNOSIS — S93402S Sprain of unspecified ligament of left ankle, sequela: Secondary | ICD-10-CM | POA: Diagnosis not present

## 2020-03-04 DIAGNOSIS — R519 Headache, unspecified: Secondary | ICD-10-CM | POA: Diagnosis not present

## 2020-03-04 DIAGNOSIS — S59911A Unspecified injury of right forearm, initial encounter: Secondary | ICD-10-CM | POA: Diagnosis not present

## 2020-03-04 DIAGNOSIS — S8992XA Unspecified injury of left lower leg, initial encounter: Secondary | ICD-10-CM | POA: Diagnosis not present

## 2020-03-04 DIAGNOSIS — I10 Essential (primary) hypertension: Secondary | ICD-10-CM | POA: Diagnosis not present

## 2020-03-04 DIAGNOSIS — R0789 Other chest pain: Secondary | ICD-10-CM | POA: Diagnosis not present

## 2020-03-04 DIAGNOSIS — S50811A Abrasion of right forearm, initial encounter: Secondary | ICD-10-CM | POA: Diagnosis not present

## 2020-03-04 DIAGNOSIS — S93402A Sprain of unspecified ligament of left ankle, initial encounter: Secondary | ICD-10-CM | POA: Diagnosis not present

## 2020-03-04 DIAGNOSIS — R079 Chest pain, unspecified: Secondary | ICD-10-CM | POA: Diagnosis not present

## 2020-03-04 DIAGNOSIS — S299XXA Unspecified injury of thorax, initial encounter: Secondary | ICD-10-CM | POA: Diagnosis not present

## 2020-03-04 DIAGNOSIS — S79911A Unspecified injury of right hip, initial encounter: Secondary | ICD-10-CM | POA: Diagnosis not present

## 2020-03-04 DIAGNOSIS — S92352A Displaced fracture of fifth metatarsal bone, left foot, initial encounter for closed fracture: Secondary | ICD-10-CM | POA: Diagnosis not present

## 2020-03-04 DIAGNOSIS — R110 Nausea: Secondary | ICD-10-CM | POA: Diagnosis not present

## 2020-03-04 DIAGNOSIS — S92302A Fracture of unspecified metatarsal bone(s), left foot, initial encounter for closed fracture: Secondary | ICD-10-CM | POA: Diagnosis not present

## 2020-03-04 DIAGNOSIS — S79912A Unspecified injury of left hip, initial encounter: Secondary | ICD-10-CM | POA: Diagnosis not present

## 2020-03-04 DIAGNOSIS — S92356A Nondisplaced fracture of fifth metatarsal bone, unspecified foot, initial encounter for closed fracture: Secondary | ICD-10-CM | POA: Diagnosis not present

## 2020-03-06 ENCOUNTER — Encounter: Payer: Medicare Other | Admitting: Gastroenterology

## 2020-03-06 DIAGNOSIS — E039 Hypothyroidism, unspecified: Secondary | ICD-10-CM | POA: Diagnosis not present

## 2020-03-06 DIAGNOSIS — R5383 Other fatigue: Secondary | ICD-10-CM | POA: Diagnosis not present

## 2020-03-06 DIAGNOSIS — E030 Congenital hypothyroidism with diffuse goiter: Secondary | ICD-10-CM | POA: Diagnosis not present

## 2020-03-06 DIAGNOSIS — Z712 Person consulting for explanation of examination or test findings: Secondary | ICD-10-CM | POA: Diagnosis not present

## 2020-03-06 DIAGNOSIS — E785 Hyperlipidemia, unspecified: Secondary | ICD-10-CM | POA: Diagnosis not present

## 2020-03-06 DIAGNOSIS — R600 Localized edema: Secondary | ICD-10-CM | POA: Diagnosis not present

## 2020-03-18 DIAGNOSIS — R0602 Shortness of breath: Secondary | ICD-10-CM | POA: Diagnosis not present

## 2020-03-18 DIAGNOSIS — R0600 Dyspnea, unspecified: Secondary | ICD-10-CM | POA: Diagnosis not present

## 2020-03-25 DIAGNOSIS — R002 Palpitations: Secondary | ICD-10-CM | POA: Diagnosis not present

## 2020-03-25 DIAGNOSIS — I10 Essential (primary) hypertension: Secondary | ICD-10-CM | POA: Diagnosis not present

## 2020-03-25 DIAGNOSIS — R072 Precordial pain: Secondary | ICD-10-CM | POA: Diagnosis not present

## 2020-03-25 DIAGNOSIS — R609 Edema, unspecified: Secondary | ICD-10-CM | POA: Diagnosis not present

## 2020-03-25 DIAGNOSIS — E7849 Other hyperlipidemia: Secondary | ICD-10-CM | POA: Diagnosis not present

## 2020-03-25 DIAGNOSIS — Z6841 Body Mass Index (BMI) 40.0 and over, adult: Secondary | ICD-10-CM | POA: Diagnosis not present

## 2020-03-25 DIAGNOSIS — Z5181 Encounter for therapeutic drug level monitoring: Secondary | ICD-10-CM | POA: Diagnosis not present

## 2020-03-25 DIAGNOSIS — Z79899 Other long term (current) drug therapy: Secondary | ICD-10-CM | POA: Diagnosis not present

## 2020-03-25 DIAGNOSIS — R0600 Dyspnea, unspecified: Secondary | ICD-10-CM | POA: Diagnosis not present

## 2020-03-26 DIAGNOSIS — G4733 Obstructive sleep apnea (adult) (pediatric): Secondary | ICD-10-CM | POA: Diagnosis not present

## 2020-03-26 DIAGNOSIS — R5383 Other fatigue: Secondary | ICD-10-CM | POA: Diagnosis not present

## 2020-03-26 DIAGNOSIS — E662 Morbid (severe) obesity with alveolar hypoventilation: Secondary | ICD-10-CM | POA: Diagnosis not present

## 2020-03-26 DIAGNOSIS — J4540 Moderate persistent asthma, uncomplicated: Secondary | ICD-10-CM | POA: Diagnosis not present

## 2020-03-31 DIAGNOSIS — I491 Atrial premature depolarization: Secondary | ICD-10-CM | POA: Diagnosis not present

## 2020-04-07 DIAGNOSIS — R5383 Other fatigue: Secondary | ICD-10-CM | POA: Diagnosis not present

## 2020-04-07 DIAGNOSIS — I10 Essential (primary) hypertension: Secondary | ICD-10-CM | POA: Diagnosis not present

## 2020-04-07 DIAGNOSIS — R600 Localized edema: Secondary | ICD-10-CM | POA: Diagnosis not present

## 2020-04-07 DIAGNOSIS — Z712 Person consulting for explanation of examination or test findings: Secondary | ICD-10-CM | POA: Diagnosis not present

## 2020-04-07 DIAGNOSIS — E1165 Type 2 diabetes mellitus with hyperglycemia: Secondary | ICD-10-CM | POA: Diagnosis not present

## 2020-04-07 DIAGNOSIS — E785 Hyperlipidemia, unspecified: Secondary | ICD-10-CM | POA: Diagnosis not present

## 2020-04-07 DIAGNOSIS — E030 Congenital hypothyroidism with diffuse goiter: Secondary | ICD-10-CM | POA: Diagnosis not present

## 2020-04-08 DIAGNOSIS — Z79899 Other long term (current) drug therapy: Secondary | ICD-10-CM | POA: Diagnosis not present

## 2020-04-08 DIAGNOSIS — Z5181 Encounter for therapeutic drug level monitoring: Secondary | ICD-10-CM | POA: Diagnosis not present

## 2020-04-23 DIAGNOSIS — E1165 Type 2 diabetes mellitus with hyperglycemia: Secondary | ICD-10-CM | POA: Diagnosis not present

## 2020-04-23 DIAGNOSIS — I10 Essential (primary) hypertension: Secondary | ICD-10-CM | POA: Diagnosis not present

## 2020-04-23 DIAGNOSIS — R0600 Dyspnea, unspecified: Secondary | ICD-10-CM | POA: Diagnosis not present

## 2020-04-23 DIAGNOSIS — R600 Localized edema: Secondary | ICD-10-CM | POA: Diagnosis not present

## 2020-04-23 DIAGNOSIS — Z130 Encounter for screening for diseases of the blood and blood-forming organs and certain disorders involving the immune mechanism: Secondary | ICD-10-CM | POA: Diagnosis not present

## 2020-05-07 DIAGNOSIS — R5383 Other fatigue: Secondary | ICD-10-CM | POA: Diagnosis not present

## 2020-05-07 DIAGNOSIS — E662 Morbid (severe) obesity with alveolar hypoventilation: Secondary | ICD-10-CM | POA: Diagnosis not present

## 2020-05-07 DIAGNOSIS — G4733 Obstructive sleep apnea (adult) (pediatric): Secondary | ICD-10-CM | POA: Diagnosis not present

## 2020-05-07 DIAGNOSIS — J4540 Moderate persistent asthma, uncomplicated: Secondary | ICD-10-CM | POA: Diagnosis not present

## 2020-05-09 DIAGNOSIS — R0609 Other forms of dyspnea: Secondary | ICD-10-CM | POA: Insufficient documentation

## 2020-05-09 DIAGNOSIS — E039 Hypothyroidism, unspecified: Secondary | ICD-10-CM | POA: Insufficient documentation

## 2020-05-09 DIAGNOSIS — K219 Gastro-esophageal reflux disease without esophagitis: Secondary | ICD-10-CM | POA: Insufficient documentation

## 2020-05-12 ENCOUNTER — Other Ambulatory Visit: Payer: Self-pay | Admitting: Podiatry

## 2020-05-12 ENCOUNTER — Ambulatory Visit (INDEPENDENT_AMBULATORY_CARE_PROVIDER_SITE_OTHER): Payer: Medicare Other | Admitting: Podiatry

## 2020-05-12 DIAGNOSIS — E1165 Type 2 diabetes mellitus with hyperglycemia: Secondary | ICD-10-CM | POA: Diagnosis not present

## 2020-05-12 DIAGNOSIS — I10 Essential (primary) hypertension: Secondary | ICD-10-CM | POA: Diagnosis not present

## 2020-05-12 DIAGNOSIS — M25561 Pain in right knee: Secondary | ICD-10-CM | POA: Diagnosis not present

## 2020-05-12 DIAGNOSIS — H6120 Impacted cerumen, unspecified ear: Secondary | ICD-10-CM | POA: Diagnosis not present

## 2020-05-12 DIAGNOSIS — M545 Low back pain: Secondary | ICD-10-CM | POA: Diagnosis not present

## 2020-05-12 DIAGNOSIS — Z5329 Procedure and treatment not carried out because of patient's decision for other reasons: Secondary | ICD-10-CM

## 2020-05-12 DIAGNOSIS — R42 Dizziness and giddiness: Secondary | ICD-10-CM | POA: Diagnosis not present

## 2020-05-12 DIAGNOSIS — M79672 Pain in left foot: Secondary | ICD-10-CM

## 2020-05-12 NOTE — Progress Notes (Signed)
No show for appt. 

## 2020-05-29 DIAGNOSIS — I5189 Other ill-defined heart diseases: Secondary | ICD-10-CM | POA: Diagnosis not present

## 2020-05-29 DIAGNOSIS — R42 Dizziness and giddiness: Secondary | ICD-10-CM | POA: Diagnosis not present

## 2020-05-29 DIAGNOSIS — E039 Hypothyroidism, unspecified: Secondary | ICD-10-CM | POA: Diagnosis not present

## 2020-05-29 DIAGNOSIS — R0600 Dyspnea, unspecified: Secondary | ICD-10-CM | POA: Diagnosis not present

## 2020-05-29 DIAGNOSIS — R609 Edema, unspecified: Secondary | ICD-10-CM | POA: Diagnosis not present

## 2020-05-29 DIAGNOSIS — E7849 Other hyperlipidemia: Secondary | ICD-10-CM | POA: Diagnosis not present

## 2020-05-29 DIAGNOSIS — K219 Gastro-esophageal reflux disease without esophagitis: Secondary | ICD-10-CM | POA: Diagnosis not present

## 2020-05-29 DIAGNOSIS — R0602 Shortness of breath: Secondary | ICD-10-CM | POA: Diagnosis not present

## 2020-05-29 DIAGNOSIS — I340 Nonrheumatic mitral (valve) insufficiency: Secondary | ICD-10-CM | POA: Diagnosis not present

## 2020-05-29 DIAGNOSIS — I119 Hypertensive heart disease without heart failure: Secondary | ICD-10-CM | POA: Diagnosis not present

## 2020-06-06 DIAGNOSIS — F331 Major depressive disorder, recurrent, moderate: Secondary | ICD-10-CM | POA: Diagnosis not present

## 2020-06-15 DIAGNOSIS — I10 Essential (primary) hypertension: Secondary | ICD-10-CM | POA: Diagnosis not present

## 2020-06-15 DIAGNOSIS — R52 Pain, unspecified: Secondary | ICD-10-CM | POA: Diagnosis not present

## 2020-06-15 DIAGNOSIS — W19XXXA Unspecified fall, initial encounter: Secondary | ICD-10-CM | POA: Diagnosis not present

## 2020-06-15 DIAGNOSIS — S20212A Contusion of left front wall of thorax, initial encounter: Secondary | ICD-10-CM | POA: Diagnosis not present

## 2020-06-15 DIAGNOSIS — R0781 Pleurodynia: Secondary | ICD-10-CM | POA: Diagnosis not present

## 2020-06-15 DIAGNOSIS — R069 Unspecified abnormalities of breathing: Secondary | ICD-10-CM | POA: Diagnosis not present

## 2020-06-18 DIAGNOSIS — E049 Nontoxic goiter, unspecified: Secondary | ICD-10-CM | POA: Diagnosis not present

## 2020-06-18 DIAGNOSIS — E039 Hypothyroidism, unspecified: Secondary | ICD-10-CM | POA: Diagnosis not present

## 2020-06-24 DIAGNOSIS — E049 Nontoxic goiter, unspecified: Secondary | ICD-10-CM | POA: Diagnosis not present

## 2020-06-24 DIAGNOSIS — E039 Hypothyroidism, unspecified: Secondary | ICD-10-CM | POA: Diagnosis not present

## 2020-07-10 DIAGNOSIS — R05 Cough: Secondary | ICD-10-CM | POA: Diagnosis not present

## 2020-07-10 DIAGNOSIS — R0600 Dyspnea, unspecified: Secondary | ICD-10-CM | POA: Diagnosis not present

## 2020-07-10 DIAGNOSIS — R0981 Nasal congestion: Secondary | ICD-10-CM | POA: Diagnosis not present

## 2020-07-10 DIAGNOSIS — R296 Repeated falls: Secondary | ICD-10-CM | POA: Diagnosis not present

## 2020-07-16 DIAGNOSIS — M545 Low back pain: Secondary | ICD-10-CM | POA: Diagnosis not present

## 2020-07-16 DIAGNOSIS — S92355K Nondisplaced fracture of fifth metatarsal bone, left foot, subsequent encounter for fracture with nonunion: Secondary | ICD-10-CM | POA: Diagnosis not present

## 2020-07-24 DIAGNOSIS — S93402A Sprain of unspecified ligament of left ankle, initial encounter: Secondary | ICD-10-CM | POA: Diagnosis not present

## 2020-07-26 DIAGNOSIS — R0602 Shortness of breath: Secondary | ICD-10-CM | POA: Diagnosis not present

## 2020-07-26 DIAGNOSIS — Z20828 Contact with and (suspected) exposure to other viral communicable diseases: Secondary | ICD-10-CM | POA: Diagnosis not present

## 2020-07-26 DIAGNOSIS — U071 COVID-19: Secondary | ICD-10-CM | POA: Diagnosis not present

## 2020-07-27 DIAGNOSIS — U071 COVID-19: Secondary | ICD-10-CM | POA: Diagnosis not present

## 2020-07-28 DIAGNOSIS — R0902 Hypoxemia: Secondary | ICD-10-CM | POA: Diagnosis not present

## 2020-07-28 DIAGNOSIS — U071 COVID-19: Secondary | ICD-10-CM | POA: Diagnosis not present

## 2020-07-28 DIAGNOSIS — R0602 Shortness of breath: Secondary | ICD-10-CM | POA: Diagnosis not present

## 2020-07-28 DIAGNOSIS — R0689 Other abnormalities of breathing: Secondary | ICD-10-CM | POA: Diagnosis not present

## 2020-07-28 DIAGNOSIS — R042 Hemoptysis: Secondary | ICD-10-CM | POA: Diagnosis not present

## 2020-09-02 ENCOUNTER — Other Ambulatory Visit (HOSPITAL_COMMUNITY): Payer: Medicare Other

## 2020-09-02 ENCOUNTER — Inpatient Hospital Stay
Admission: RE | Admit: 2020-09-02 | Discharge: 2020-11-21 | Disposition: A | Discharge status: Skilled Nursing Facility | Payer: Medicare Other | Attending: Internal Medicine | Admitting: Internal Medicine

## 2020-09-02 DIAGNOSIS — Z86711 Personal history of pulmonary embolism: Secondary | ICD-10-CM | POA: Diagnosis present

## 2020-09-02 DIAGNOSIS — Z431 Encounter for attention to gastrostomy: Secondary | ICD-10-CM

## 2020-09-02 DIAGNOSIS — A419 Sepsis, unspecified organism: Secondary | ICD-10-CM | POA: Diagnosis present

## 2020-09-02 DIAGNOSIS — I509 Heart failure, unspecified: Secondary | ICD-10-CM

## 2020-09-02 DIAGNOSIS — Z9911 Dependence on respirator [ventilator] status: Secondary | ICD-10-CM

## 2020-09-02 DIAGNOSIS — J9690 Respiratory failure, unspecified, unspecified whether with hypoxia or hypercapnia: Secondary | ICD-10-CM

## 2020-09-02 DIAGNOSIS — J1282 Pneumonia due to coronavirus disease 2019: Secondary | ICD-10-CM | POA: Diagnosis present

## 2020-09-02 DIAGNOSIS — J189 Pneumonia, unspecified organism: Secondary | ICD-10-CM

## 2020-09-02 DIAGNOSIS — J811 Chronic pulmonary edema: Secondary | ICD-10-CM

## 2020-09-02 DIAGNOSIS — R0902 Hypoxemia: Secondary | ICD-10-CM

## 2020-09-02 DIAGNOSIS — U071 COVID-19: Secondary | ICD-10-CM | POA: Diagnosis present

## 2020-09-02 DIAGNOSIS — J9621 Acute and chronic respiratory failure with hypoxia: Secondary | ICD-10-CM | POA: Diagnosis present

## 2020-09-02 HISTORY — DX: Pneumonia due to coronavirus disease 2019: J12.82

## 2020-09-02 HISTORY — DX: Personal history of pulmonary embolism: Z86.711

## 2020-09-02 HISTORY — DX: Severe sepsis without septic shock: R65.20

## 2020-09-02 HISTORY — DX: COVID-19: U07.1

## 2020-09-02 HISTORY — DX: Acute and chronic respiratory failure with hypoxia: J96.21

## 2020-09-02 HISTORY — DX: Sepsis, unspecified organism: A41.9

## 2020-09-02 LAB — URINALYSIS, ROUTINE W REFLEX MICROSCOPIC
Bilirubin Urine: NEGATIVE
Glucose, UA: NEGATIVE mg/dL
Ketones, ur: NEGATIVE mg/dL
Leukocytes,Ua: NEGATIVE
Nitrite: NEGATIVE
Protein, ur: 30 mg/dL — AB
RBC / HPF: >50 RBC/hpf — ABNORMAL HIGH (ref 0–5)
Specific Gravity, Urine: 1.017 (ref 1.005–1.030)
pH: 5 (ref 5.0–8.0)

## 2020-09-02 LAB — BLOOD GAS, ARTERIAL
Acid-Base Excess: 11.8 mmol/L — ABNORMAL HIGH (ref 0.0–2.0)
Bicarbonate: 38 mmol/L — ABNORMAL HIGH (ref 20.0–28.0)
FIO2: 60
O2 Saturation: 91.6 %
Patient temperature: 36
pCO2 arterial: 70.8 mmHg (ref 32.0–48.0)
pH, Arterial: 7.343 — ABNORMAL LOW (ref 7.350–7.450)
pO2, Arterial: 62.9 mmHg — ABNORMAL LOW (ref 83.0–108.0)

## 2020-09-03 DIAGNOSIS — J1282 Pneumonia due to coronavirus disease 2019: Secondary | ICD-10-CM

## 2020-09-03 DIAGNOSIS — U071 COVID-19: Secondary | ICD-10-CM | POA: Diagnosis not present

## 2020-09-03 DIAGNOSIS — Z9911 Dependence on respirator [ventilator] status: Secondary | ICD-10-CM

## 2020-09-03 DIAGNOSIS — A419 Sepsis, unspecified organism: Secondary | ICD-10-CM | POA: Diagnosis not present

## 2020-09-03 DIAGNOSIS — Z86711 Personal history of pulmonary embolism: Secondary | ICD-10-CM

## 2020-09-03 DIAGNOSIS — J9621 Acute and chronic respiratory failure with hypoxia: Secondary | ICD-10-CM

## 2020-09-03 DIAGNOSIS — R6520 Severe sepsis without septic shock: Secondary | ICD-10-CM

## 2020-09-03 LAB — COMPREHENSIVE METABOLIC PANEL
ALT: 56 U/L — ABNORMAL HIGH (ref 0–44)
AST: 48 U/L — ABNORMAL HIGH (ref 15–41)
Albumin: 3 g/dL — ABNORMAL LOW (ref 3.5–5.0)
Alkaline Phosphatase: 123 U/L (ref 38–126)
Anion gap: 9 (ref 5–15)
BUN: 30 mg/dL — ABNORMAL HIGH (ref 8–23)
CO2: 35 mmol/L — ABNORMAL HIGH (ref 22–32)
Calcium: 9.3 mg/dL (ref 8.9–10.3)
Chloride: 93 mmol/L — ABNORMAL LOW (ref 98–111)
Creatinine, Ser: 0.53 mg/dL (ref 0.44–1.00)
GFR, Estimated: >60 mL/min (ref >60)
Glucose, Bld: 226 mg/dL — ABNORMAL HIGH (ref 70–99)
Potassium: 4.2 mmol/L (ref 3.5–5.1)
Sodium: 137 mmol/L (ref 135–145)
Total Bilirubin: 0.9 mg/dL (ref 0.3–1.2)
Total Protein: 7.5 g/dL (ref 6.5–8.1)

## 2020-09-03 LAB — CBC WITH DIFFERENTIAL/PLATELET
Abs Immature Granulocytes: 0.72 10*3/uL — ABNORMAL HIGH (ref 0.00–0.07)
Basophils Absolute: 0.1 10*3/uL (ref 0.0–0.1)
Basophils Relative: 0 %
Eosinophils Absolute: 0 10*3/uL (ref 0.0–0.5)
Eosinophils Relative: 0 %
HCT: 24.8 % — ABNORMAL LOW (ref 36.0–46.0)
Hemoglobin: 7.6 g/dL — ABNORMAL LOW (ref 12.0–15.0)
Immature Granulocytes: 6 %
Lymphocytes Relative: 9 %
Lymphs Abs: 1.1 10*3/uL (ref 0.7–4.0)
MCH: 33 pg (ref 26.0–34.0)
MCHC: 30.6 g/dL (ref 30.0–36.0)
MCV: 107.8 fL — ABNORMAL HIGH (ref 80.0–100.0)
Monocytes Absolute: 0.4 10*3/uL (ref 0.1–1.0)
Monocytes Relative: 3 %
Neutro Abs: 10.3 10*3/uL — ABNORMAL HIGH (ref 1.7–7.7)
Neutrophils Relative %: 82 %
Platelets: 187 10*3/uL (ref 150–400)
RBC: 2.3 MIL/uL — ABNORMAL LOW (ref 3.87–5.11)
RDW: 22.3 % — ABNORMAL HIGH (ref 11.5–15.5)
WBC: 12.5 10*3/uL — ABNORMAL HIGH (ref 4.0–10.5)
nRBC: 1.7 % — ABNORMAL HIGH (ref 0.0–0.2)

## 2020-09-03 LAB — PROTIME-INR
INR: 1.8 — ABNORMAL HIGH (ref 0.8–1.2)
Prothrombin Time: 20 seconds — ABNORMAL HIGH (ref 11.4–15.2)

## 2020-09-03 LAB — PHOSPHORUS: Phosphorus: 5.4 mg/dL — ABNORMAL HIGH (ref 2.5–4.6)

## 2020-09-03 LAB — MAGNESIUM: Magnesium: 2.3 mg/dL (ref 1.7–2.4)

## 2020-09-03 LAB — HEMOGLOBIN A1C
Hgb A1c MFr Bld: 6.3 % — ABNORMAL HIGH (ref 4.8–5.6)
Mean Plasma Glucose: 134.11 mg/dL

## 2020-09-03 LAB — TSH: TSH: 28.975 u[IU]/mL — ABNORMAL HIGH (ref 0.350–4.500)

## 2020-09-03 LAB — T4, FREE: Free T4: 0.37 ng/dL — ABNORMAL LOW (ref 0.61–1.12)

## 2020-09-04 ENCOUNTER — Encounter: Payer: Self-pay | Admitting: Internal Medicine

## 2020-09-04 DIAGNOSIS — A419 Sepsis, unspecified organism: Secondary | ICD-10-CM | POA: Diagnosis present

## 2020-09-04 DIAGNOSIS — U071 COVID-19: Secondary | ICD-10-CM | POA: Diagnosis not present

## 2020-09-04 DIAGNOSIS — J9621 Acute and chronic respiratory failure with hypoxia: Secondary | ICD-10-CM | POA: Diagnosis not present

## 2020-09-04 DIAGNOSIS — J1282 Pneumonia due to coronavirus disease 2019: Secondary | ICD-10-CM | POA: Diagnosis present

## 2020-09-04 DIAGNOSIS — Z86711 Personal history of pulmonary embolism: Secondary | ICD-10-CM | POA: Diagnosis not present

## 2020-09-04 LAB — URINE CULTURE: Culture: NO GROWTH

## 2020-09-04 NOTE — Consult Note (Addendum)
Pulmonary Critical Care Medicine [place]  PULMONARY SERVICE  Date of Service: 09/03/2020  PULMONARY CRITICAL CARE CONSULT   Marie Chavez  BDZ:[id number]  DOB: 1955/12/02   DOA: 09/02/2020  Referring Physician: Merton Border, MD  HPI: Marie Chavez is a 64 y.o. female seen for follow up of Acute on Chronic Respiratory Failure.  Patient has multiple medical problems including chronic bronchitis depression DJD GERD allergies hypertension hyperlipidemia pneumonia who presented to the transferring facility with complaints of increasing shortness of breath.  Symptoms have been going on for about 4 days this was back in October.  Patient had a complicated course with severe oxygen desaturations and hypoxia.  Hospital course patient initially was on BiPAP and subsequently ended up intubated paralyzed and in prone position ventilation.  Patient required 80% FiO2 throughout and at the lowest point she had been on 35% however patient became unstable and ended up having to go up on the FiO2.  Subsequently patient had a tracheostomy done on October 22.  At the same time she also had a PEG done.  She continues to require high oxygen requirements of 50% 40% with a PEEP of 8.  They continue to try to wean the FiO2 without much success.  She presented to [place] on 70% FiO2.  She is here for further management and weaning  Review of Systems:  ROS performed and is unremarkable other than noted above.  Past Medical History:  Diagnosis Date  . Bronchitis    Hx of  . Depression   . DJD (degenerative joint disease)   . Dry skin   . Environmental allergies   . GERD (gastroesophageal reflux disease)   . Herniated disc   . Hypertension   . Hypoglycemia   . Hypothyroidism   . Panic disorder   . Pneumonia    Hx of "walking Pneumonia"   . PONV (postoperative nausea and vomiting)    Had a panic attack upon awakening from Hysterectomy   . Seasonal allergies   . Shortness of breath    when  bending over or just sitting thinks it comes from lung mass  . Tachycardia    Takes Atenolol  . Urgency of urination     Past Surgical History:  Procedure Laterality Date  . APPENDECTOMY    . CHOLECYSTECTOMY    . HERNIA REPAIR     X 3  . VAGINAL HYSTERECTOMY    . Vaginal sling  2012  . VIDEO ASSISTED THORACOSCOPY (VATS)/WEDGE RESECTION Left 11/28/2013   Procedure: VIDEO ASSISTED THORACOSCOPY (VATS)  Lobectomy;  Surgeon: Grace Isaac, MD;  Location: [facility];  Service: Thoracic;  Laterality: Left;  Marland Kitchen VIDEO BRONCHOSCOPY N/A 11/28/2013   Procedure: VIDEO BRONCHOSCOPY;  Surgeon: Grace Isaac, MD;  Location: [facility];  Service: Thoracic;  Laterality: N/A;    Social History:    reports that she has never smoked. She has never used smokeless tobacco. She reports that she does not drink alcohol and does not use drugs.  Family History: Non-Contributory to the present illness  Allergies  Allergen Reactions  . Bee Venom Anaphylaxis and Other (See Comments)    Developed "blue rings" all over body after being stung  . Celebrex [Celecoxib]     REACTION: heart rate increases, itching  . Mobic [Meloxicam] Itching    HEART RACES  . Prednisone     REACTION: Heart rate increases, itching    Medications: Reviewed on Rounds  Physical Exam:  Vitals: Temperature 96.7  pulse 67 respiratory rate 21 blood pressure is 118/70 saturations 100%  Ventilator Settings on assist control FiO2 70% tidal volume 440 PEEP 8  . General: Comfortable at this time . Eyes: Grossly normal lids, irises & conjunctiva . ENT: grossly tongue is normal . Neck: no obvious mass . Cardiovascular: S1-S2 normal no gallop or rub . Respiratory: No rhonchi no rales are noted at this time . Abdomen: Soft and nontender . Skin: no rash seen on limited exam . Musculoskeletal: not rigid . Psychiatric:unable to assess . Neurologic: no seizure no involuntary movements         Labs on Admission:  Basic Metabolic  Panel: Recent Labs  Lab 09/03/20 0428  NA 137  K 4.2  CL 93*  CO2 35*  GLUCOSE 226*  BUN 30*  CREATININE 0.53  CALCIUM 9.3  MG 2.3  PHOS 5.4*    Recent Labs  Lab 09/02/20 1533  PHART 7.343*  PCO2ART 70.8*  PO2ART 62.9*  HCO3 38.0*  O2SAT 91.6    Liver Function Tests: Recent Labs  Lab 09/03/20 0428  AST 48*  ALT 56*  ALKPHOS 123  BILITOT 0.9  PROT 7.5  ALBUMIN 3.0*   No results for input(s): LIPASE, AMYLASE in the last 168 hours. No results for input(s): AMMONIA in the last 168 hours.  CBC: Recent Labs  Lab 09/03/20 0428  WBC 12.5*  NEUTROABS 10.3*  HGB 7.6*  HCT 24.8*  MCV 107.8*  PLT 187    Cardiac Enzymes: No results for input(s): CKTOTAL, CKMB, CKMBINDEX, TROPONINI in the last 168 hours.  BNP (last 3 results) No results for input(s): BNP in the last 8760 hours.  ProBNP (last 3 results) No results for input(s): PROBNP in the last 8760 hours.   Radiological Exams on Admission: DG ABDOMEN PEG TUBE LOCATION  Result Date: 09/02/2020 CLINICAL DATA:  Gastrostomy positioning EXAM: ABDOMEN - 1 VIEW COMPARISON:  None. FINDINGS: Oral contrast is been administered through a gastrostomy catheter. The gastrostomy catheter is positioned within the stomach. Contrast is seen in the proximal stomach. No contrast extravasation. There is no appreciable bowel dilatation or air-fluid level to suggest bowel obstruction. No free air is seen on this supine examination. There is extensive airspace opacity throughout the visualized lungs. IMPRESSION: Gastrostomy catheter within stomach. No contrast extravasation from the stomach. No bowel obstruction or free air appreciable. Electronically Signed   By: Lowella Grip III M.D.   On: 09/02/2020 15:28   DG CHEST PORT 1 VIEW  Result Date: 09/02/2020 CLINICAL DATA:  Shortness of breath EXAM: PORTABLE CHEST 1 VIEW COMPARISON:  August 28, 2020 FINDINGS: Tracheostomy catheter tip is 5.1 cm above the carina. Central catheter  tip is in the superior vena cava near the cavoatrial junction. No pneumothorax. Widespread interstitial thickening and alveolar opacity remains bilaterally. Heart remains prominent with pulmonary vascularity within normal limits. No adenopathy appreciable. No bone lesions. Postoperative change noted in the lower cervical spine. IMPRESSION: Tube and catheter positions as described without pneumothorax. Diffuse opacity in the lungs bilaterally, likely representing widespread pulmonary edema, multifocal pneumonia may present in this manner. Both entities may be present concurrently. Stable cardiac prominence. Electronically Signed   By: Lowella Grip III M.D.   On: 09/02/2020 15:29    Assessment/Plan Active Problems:   Acute on chronic respiratory failure with hypoxia ([place])   COVID-19 virus infection   Pneumonia due to COVID-19 virus   History of pulmonary embolism   Severe sepsis ([place])   1. Acute on chronic  respiratory failure hypoxia plan is to continue with full support on the ventilator patient's FiO2 requirements are still significantly elevated at 70%.  We will try to titrate down the chest PEEP accordingly. 2. COVID-19 virus infection now is in recovery patient has severe residual pulmonary damage with bilateral infiltrates.  We will continue to monitor closely. 3. Severe sepsis she has had multiple infections including COVID-19 pneumonia and gram-negative rods and healthcare associated pneumonia.  We will continue to monitor radiologically she remains at risk. 4. COVID-19 pneumonia treated in resolution with severe pulmonary damage. 5. History of pulmonary embolism she has been on chronic Eliquis we will continue to monitor.  I have personally seen and evaluated the patient, evaluated laboratory and imaging results, formulated the assessment and plan and placed orders. The Patient requires high complexity decision making with multiple systems involvement.  Case was discussed on Rounds  with the Respiratory Therapy Director and the Respiratory staff Time Spent 11minutes  Ronne Stefanski A Gifford Ballon, MD [facility] Pulmonary Critical Care Medicine Sleep Medicine

## 2020-09-04 NOTE — Progress Notes (Signed)
Pulmonary Critical Care Medicine [place]   PULMONARY CRITICAL CARE SERVICE  PROGRESS NOTE  Date of Service: 09/04/2020  LIONA WENGERT  IDP:[id number]  DOB: Sep 07, 1956   DOA: 09/02/2020  Referring Physician: Merton Border, MD  HPI: Marie Chavez is a 64 y.o. female seen for follow up of Acute on Chronic Respiratory Failure.  Patient currently is on assist control has been on 70% FiO2 with a PEEP of five  Medications: Reviewed on Rounds  Physical Exam:  Vitals: Temperature 96.7 pulse sixty respiratory twenty-seven blood pressure is 99/50 saturations 96%  Ventilator Settings on assist control FiO2 of 70% PEEP is 8.0  . General: Comfortable at this time . Eyes: Grossly normal lids, irises & conjunctiva . ENT: grossly tongue is normal . Neck: no obvious mass . Cardiovascular: S1 S2 normal no gallop . Respiratory: Scattered rhonchi expansion is equal . Abdomen: soft . Skin: no rash seen on limited exam . Musculoskeletal: not rigid . Psychiatric:unable to assess . Neurologic: no seizure no involuntary movements         Lab Data:   Basic Metabolic Panel: Recent Labs  Lab 09/03/20 0428  NA 137  K 4.2  CL 93*  CO2 35*  GLUCOSE 226*  BUN 30*  CREATININE 0.53  CALCIUM 9.3  MG 2.3  PHOS 5.4*    ABG: Recent Labs  Lab 09/02/20 1533  PHART 7.343*  PCO2ART 70.8*  PO2ART 62.9*  HCO3 38.0*  O2SAT 91.6    Liver Function Tests: Recent Labs  Lab 09/03/20 0428  AST 48*  ALT 56*  ALKPHOS 123  BILITOT 0.9  PROT 7.5  ALBUMIN 3.0*   No results for input(s): LIPASE, AMYLASE in the last 168 hours. No results for input(s): AMMONIA in the last 168 hours.  CBC: Recent Labs  Lab 09/03/20 0428  WBC 12.5*  NEUTROABS 10.3*  HGB 7.6*  HCT 24.8*  MCV 107.8*  PLT 187    Cardiac Enzymes: No results for input(s): CKTOTAL, CKMB, CKMBINDEX, TROPONINI in the last 168 hours.  BNP (last 3 results) No results for input(s): BNP in the last 8760  hours.  ProBNP (last 3 results) No results for input(s): PROBNP in the last 8760 hours.  Radiological Exams: DG ABDOMEN PEG TUBE LOCATION  Result Date: 09/02/2020 CLINICAL DATA:  Gastrostomy positioning EXAM: ABDOMEN - 1 VIEW COMPARISON:  None. FINDINGS: Oral contrast is been administered through a gastrostomy catheter. The gastrostomy catheter is positioned within the stomach. Contrast is seen in the proximal stomach. No contrast extravasation. There is no appreciable bowel dilatation or air-fluid level to suggest bowel obstruction. No free air is seen on this supine examination. There is extensive airspace opacity throughout the visualized lungs. IMPRESSION: Gastrostomy catheter within stomach. No contrast extravasation from the stomach. No bowel obstruction or free air appreciable. Electronically Signed   By: Lowella Grip III M.D.   On: 09/02/2020 15:28   DG CHEST PORT 1 VIEW  Result Date: 09/02/2020 CLINICAL DATA:  Shortness of breath EXAM: PORTABLE CHEST 1 VIEW COMPARISON:  August 28, 2020 FINDINGS: Tracheostomy catheter tip is 5.1 cm above the carina. Central catheter tip is in the superior vena cava near the cavoatrial junction. No pneumothorax. Widespread interstitial thickening and alveolar opacity remains bilaterally. Heart remains prominent with pulmonary vascularity within normal limits. No adenopathy appreciable. No bone lesions. Postoperative change noted in the lower cervical spine. IMPRESSION: Tube and catheter positions as described without pneumothorax. Diffuse opacity in the lungs bilaterally, likely representing widespread  pulmonary edema, multifocal pneumonia may present in this manner. Both entities may be present concurrently. Stable cardiac prominence. Electronically Signed   By: Lowella Grip III M.D.   On: 09/02/2020 15:29    Assessment/Plan Active Problems:   Acute on chronic respiratory failure with hypoxia ([place])   COVID-19 virus infection   Pneumonia due to  COVID-19 virus   History of pulmonary embolism   Severe sepsis ([place])   1. Acute on chronic respiratory failure hypoxia plan is to continue with full support on the ventilator and is on assist control mode patient is on tolerating weaning still with high FiO2 requirement to 70% 2. COVID-19 virus infection in recovery we will continue with supportive care 3. Pneumonia due to COVID-19 residual damage noted as previous 4. Pulmonary embolism treated 5. Severe sepsis resolved   I have personally seen and evaluated the patient, evaluated laboratory and imaging results, formulated the assessment and plan and placed orders. The Patient requires high complexity decision making with multiple systems involvement.  Rounds were done with the Respiratory Therapy Director and Staff therapists and discussed with nursing staff also.  [name], MD [facility] Pulmonary Critical Care Medicine Sleep Medicine

## 2020-09-05 ENCOUNTER — Other Ambulatory Visit (HOSPITAL_COMMUNITY): Payer: Medicare Other

## 2020-09-05 DIAGNOSIS — A419 Sepsis, unspecified organism: Secondary | ICD-10-CM | POA: Diagnosis not present

## 2020-09-05 DIAGNOSIS — U071 COVID-19: Secondary | ICD-10-CM | POA: Diagnosis not present

## 2020-09-05 DIAGNOSIS — J9621 Acute and chronic respiratory failure with hypoxia: Secondary | ICD-10-CM | POA: Diagnosis not present

## 2020-09-05 DIAGNOSIS — Z86711 Personal history of pulmonary embolism: Secondary | ICD-10-CM | POA: Diagnosis not present

## 2020-09-05 LAB — CULTURE, RESPIRATORY W GRAM STAIN: Culture: NO GROWTH

## 2020-09-05 NOTE — Progress Notes (Signed)
Pulmonary Critical Care Medicine [place]   PULMONARY CRITICAL CARE SERVICE  PROGRESS NOTE  Date of Service: 09/05/2020  Marie Chavez  GEX:[id number]  DOB: 20-Feb-1956   DOA: 09/02/2020  Referring Physician: Merton Border, MD  HPI: Marie Chavez is a 64 y.o. female seen for follow up of Acute on Chronic Respiratory Failure.  Patient currently is on assist control with been on an FiO2 of 70% which is not really showing much improvement  Medications: Reviewed on Rounds  Physical Exam:  Vitals: Temperature 98.2 pulse 99 respiratory rate 20 blood pressure is 106/76 saturations 99%  Ventilator Settings on assist control FiO2 70% tidal volume 440 PEEP 8.0  . General: Comfortable at this time . Eyes: Grossly normal lids, irises & conjunctiva . ENT: grossly tongue is normal . Neck: no obvious mass . Cardiovascular: S1 S2 normal no gallop . Respiratory: No rhonchi no rales noted at this time . Abdomen: soft . Skin: no rash seen on limited exam . Musculoskeletal: not rigid . Psychiatric:unable to assess . Neurologic: no seizure no involuntary movements         Lab Data:   Basic Metabolic Panel: Recent Labs  Lab 09/03/20 0428  NA 137  K 4.2  CL 93*  CO2 35*  GLUCOSE 226*  BUN 30*  CREATININE 0.53  CALCIUM 9.3  MG 2.3  PHOS 5.4*    ABG: Recent Labs  Lab 09/02/20 1533  PHART 7.343*  PCO2ART 70.8*  PO2ART 62.9*  HCO3 38.0*  O2SAT 91.6    Liver Function Tests: Recent Labs  Lab 09/03/20 0428  AST 48*  ALT 56*  ALKPHOS 123  BILITOT 0.9  PROT 7.5  ALBUMIN 3.0*   No results for input(s): LIPASE, AMYLASE in the last 168 hours. No results for input(s): AMMONIA in the last 168 hours.  CBC: Recent Labs  Lab 09/03/20 0428  WBC 12.5*  NEUTROABS 10.3*  HGB 7.6*  HCT 24.8*  MCV 107.8*  PLT 187    Cardiac Enzymes: No results for input(s): CKTOTAL, CKMB, CKMBINDEX, TROPONINI in the last 168 hours.  BNP (last 3 results) No  results for input(s): BNP in the last 8760 hours.  ProBNP (last 3 results) No results for input(s): PROBNP in the last 8760 hours.  Radiological Exams: No results found.  Assessment/Plan Active Problems:   Acute on chronic respiratory failure with hypoxia ([place])   COVID-19 virus infection   Pneumonia due to COVID-19 virus   History of pulmonary embolism   Severe sepsis ([place])   1. Acute on chronic respiratory failure with hypoxia continue to try to titrate oxygen down.  Patient has not been doing much in the way of weaning. 2. COVID-19 virus infection in recovery 3. Pneumonia due to COVID-19 slow improvement 4. History of pulmonary embolism has been on anticoagulation 5. Severe sepsis resolved   I have personally seen and evaluated the patient, evaluated laboratory and imaging results, formulated the assessment and plan and placed orders. The Patient requires high complexity decision making with multiple systems involvement.  Rounds were done with the Respiratory Therapy Director and Staff therapists and discussed with nursing staff also.  [name], MD [facility] Pulmonary Critical Care Medicine Sleep Medicine

## 2020-09-06 ENCOUNTER — Other Ambulatory Visit (HOSPITAL_COMMUNITY): Payer: Medicare Other

## 2020-09-06 DIAGNOSIS — A419 Sepsis, unspecified organism: Secondary | ICD-10-CM | POA: Diagnosis not present

## 2020-09-06 DIAGNOSIS — Z86711 Personal history of pulmonary embolism: Secondary | ICD-10-CM | POA: Diagnosis not present

## 2020-09-06 DIAGNOSIS — J9621 Acute and chronic respiratory failure with hypoxia: Secondary | ICD-10-CM | POA: Diagnosis not present

## 2020-09-06 DIAGNOSIS — U071 COVID-19: Secondary | ICD-10-CM | POA: Diagnosis not present

## 2020-09-06 LAB — BASIC METABOLIC PANEL
Anion gap: 7 (ref 5–15)
BUN: 66 mg/dL — ABNORMAL HIGH (ref 8–23)
CO2: 38 mmol/L — ABNORMAL HIGH (ref 22–32)
Calcium: 9.2 mg/dL (ref 8.9–10.3)
Chloride: 93 mmol/L — ABNORMAL LOW (ref 98–111)
Creatinine, Ser: 0.75 mg/dL (ref 0.44–1.00)
GFR, Estimated: >60 mL/min (ref >60)
Glucose, Bld: 276 mg/dL — ABNORMAL HIGH (ref 70–99)
Potassium: 5.1 mmol/L (ref 3.5–5.1)
Sodium: 138 mmol/L (ref 135–145)

## 2020-09-06 LAB — CBC
HCT: 22.2 % — ABNORMAL LOW (ref 36.0–46.0)
Hemoglobin: 6.6 g/dL — CL (ref 12.0–15.0)
MCH: 33.3 pg (ref 26.0–34.0)
MCHC: 29.7 g/dL — ABNORMAL LOW (ref 30.0–36.0)
MCV: 112.1 fL — ABNORMAL HIGH (ref 80.0–100.0)
Platelets: 258 10*3/uL (ref 150–400)
RBC: 1.98 MIL/uL — ABNORMAL LOW (ref 3.87–5.11)
RDW: 25.2 % — ABNORMAL HIGH (ref 11.5–15.5)
WBC: 22.4 10*3/uL — ABNORMAL HIGH (ref 4.0–10.5)
nRBC: 4.6 % — ABNORMAL HIGH (ref 0.0–0.2)

## 2020-09-06 LAB — PREPARE RBC (CROSSMATCH)

## 2020-09-06 LAB — MAGNESIUM: Magnesium: 2.6 mg/dL — ABNORMAL HIGH (ref 1.7–2.4)

## 2020-09-06 MED ORDER — PERFLUTREN LIPID MICROSPHERE
1.0000 mL | INTRAVENOUS | Status: AC | PRN
Start: 2020-09-06 — End: 2020-09-06
  Administered 2020-09-06: 2 mL via INTRAVENOUS

## 2020-09-06 NOTE — Progress Notes (Signed)
  Echocardiogram 2D Echocardiogram has been performed.  Marie Chavez 09/06/2020, 3:48 PM

## 2020-09-06 NOTE — Progress Notes (Signed)
Pulmonary Critical Care Medicine [place]   PULMONARY CRITICAL CARE SERVICE  PROGRESS NOTE  Date of Service: 09/06/2020  Marie Chavez  HWE:[id number]  DOB: 03-08-1956   DOA: 09/02/2020  Referring Physician: Merton Border, MD  HPI: Marie Chavez is a 64 y.o. female seen for follow up of Acute on Chronic Respiratory Failure.  Patient is on assist control has been on 80% FiO2 not been able to come down on the oxygen patient with severe oxygen dependent respiratory failure  Medications: Reviewed on Rounds  Physical Exam:  Vitals: Temperature is 96.4 pulse 71 respiratory 19 blood pressure is 144/66 saturations 97%  Ventilator Settings on assist control FiO2 80% tidal volume 440 PEEP 8  . General: Comfortable at this time . Eyes: Grossly normal lids, irises & conjunctiva . ENT: grossly tongue is normal . Neck: no obvious mass . Cardiovascular: S1 S2 normal no gallop . Respiratory: No rhonchi very coarse breath sounds . Abdomen: soft . Skin: no rash seen on limited exam . Musculoskeletal: not rigid . Psychiatric:unable to assess . Neurologic: no seizure no involuntary movements         Lab Data:   Basic Metabolic Panel: Recent Labs  Lab 09/03/20 0428  NA 137  K 4.2  CL 93*  CO2 35*  GLUCOSE 226*  BUN 30*  CREATININE 0.53  CALCIUM 9.3  MG 2.3  PHOS 5.4*    ABG: Recent Labs  Lab 09/02/20 1533  PHART 7.343*  PCO2ART 70.8*  PO2ART 62.9*  HCO3 38.0*  O2SAT 91.6    Liver Function Tests: Recent Labs  Lab 09/03/20 0428  AST 48*  ALT 56*  ALKPHOS 123  BILITOT 0.9  PROT 7.5  ALBUMIN 3.0*   No results for input(s): LIPASE, AMYLASE in the last 168 hours. No results for input(s): AMMONIA in the last 168 hours.  CBC: Recent Labs  Lab 09/03/20 0428  WBC 12.5*  NEUTROABS 10.3*  HGB 7.6*  HCT 24.8*  MCV 107.8*  PLT 187    Cardiac Enzymes: No results for input(s): CKTOTAL, CKMB, CKMBINDEX, TROPONINI in the last 168  hours.  BNP (last 3 results) No results for input(s): BNP in the last 8760 hours.  ProBNP (last 3 results) No results for input(s): PROBNP in the last 8760 hours.  Radiological Exams: DG CHEST PORT 1 VIEW  Result Date: 09/05/2020 CLINICAL DATA:  Pulmonary edema.  Pneumonia. EXAM: PORTABLE CHEST 1 VIEW COMPARISON:  09/02/2020 FINDINGS: A tracheostomy tube overlies the airway. The right PICC has been removed. The cardiac silhouette is largely obscured. Widespread bilateral airspace opacities demonstrate mild worsening in the right lower lung and have not significantly changed on the left. No large pleural effusion or pneumothorax is identified. IMPRESSION: Mild interval worsening of widespread airspace disease. Electronically Signed   By: Logan Bores M.D.   On: 09/05/2020 16:51    Assessment/Plan Active Problems:   Acute on chronic respiratory failure with hypoxia ([place])   COVID-19 virus infection   Pneumonia due to COVID-19 virus   History of pulmonary embolism   Severe sepsis ([place])   1. Acute on chronic respiratory failure hypoxia continue with full support on the ventilator at this time is not secondary to high oxygen requirements last chest x-ray showed some worsening of airspace disease no effusions or pneumothorax consider an echocardiogram could not find one done even at the other facility. 2. COVID-19 virus infection overall recovery phase still with significant pulmonary changes. 3. Pneumonia due to COVID-19 treated  we will continue to follow 4. History of pulmonary embolism no change 5. Severe sepsis resolved hemodynamics are stable   I have personally seen and evaluated the patient, evaluated laboratory and imaging results, formulated the assessment and plan and placed orders. The Patient requires high complexity decision making with multiple systems involvement.  Rounds were done with the Respiratory Therapy Director and Staff therapists and discussed with nursing staff  also.  [name], MD [facility] Pulmonary Critical Care Medicine Sleep Medicine

## 2020-09-07 DIAGNOSIS — J9621 Acute and chronic respiratory failure with hypoxia: Secondary | ICD-10-CM | POA: Diagnosis not present

## 2020-09-07 DIAGNOSIS — A419 Sepsis, unspecified organism: Secondary | ICD-10-CM | POA: Diagnosis not present

## 2020-09-07 DIAGNOSIS — U071 COVID-19: Secondary | ICD-10-CM | POA: Diagnosis not present

## 2020-09-07 DIAGNOSIS — Z86711 Personal history of pulmonary embolism: Secondary | ICD-10-CM | POA: Diagnosis not present

## 2020-09-07 LAB — BASIC METABOLIC PANEL
Anion gap: 10 (ref 5–15)
BUN: 69 mg/dL — ABNORMAL HIGH (ref 8–23)
CO2: 37 mmol/L — ABNORMAL HIGH (ref 22–32)
Calcium: 9 mg/dL (ref 8.9–10.3)
Chloride: 92 mmol/L — ABNORMAL LOW (ref 98–111)
Creatinine, Ser: 0.75 mg/dL (ref 0.44–1.00)
GFR, Estimated: >60 mL/min (ref >60)
Glucose, Bld: 266 mg/dL — ABNORMAL HIGH (ref 70–99)
Potassium: 5 mmol/L (ref 3.5–5.1)
Sodium: 139 mmol/L (ref 135–145)

## 2020-09-07 LAB — CBC
HCT: 28.2 % — ABNORMAL LOW (ref 36.0–46.0)
Hemoglobin: 9.2 g/dL — ABNORMAL LOW (ref 12.0–15.0)
MCH: 33.7 pg (ref 26.0–34.0)
MCHC: 32.6 g/dL (ref 30.0–36.0)
MCV: 103.3 fL — ABNORMAL HIGH (ref 80.0–100.0)
Platelets: 242 10*3/uL (ref 150–400)
RBC: 2.73 MIL/uL — ABNORMAL LOW (ref 3.87–5.11)
RDW: 22.2 % — ABNORMAL HIGH (ref 11.5–15.5)
WBC: 19.6 10*3/uL — ABNORMAL HIGH (ref 4.0–10.5)
nRBC: 4.4 % — ABNORMAL HIGH (ref 0.0–0.2)

## 2020-09-07 LAB — OCCULT BLOOD X 1 CARD TO LAB, STOOL: Fecal Occult Bld: NEGATIVE

## 2020-09-07 LAB — MAGNESIUM: Magnesium: 2.6 mg/dL — ABNORMAL HIGH (ref 1.7–2.4)

## 2020-09-07 NOTE — Progress Notes (Signed)
Pulmonary Critical Care Medicine [place]   PULMONARY CRITICAL CARE SERVICE  PROGRESS NOTE  Date of Service: 09/07/2020  Marie Chavez  WFU:[id number]  DOB: 1956/05/18   DOA: 09/02/2020  Referring Physician: Merton Border, MD  HPI: Marie Chavez is a 64 y.o. female seen for follow up of Acute on Chronic Respiratory Failure. Patient is on full support on assist control mode has been on 80% FiO2 good saturations are noted at this time  Medications: Reviewed on Rounds  Physical Exam:  Vitals: Temperature 97.2 pulse 97 respiratory rate 16 blood pressure is 157/74 saturations 95%  Ventilator Settings on assist control FiO2 is 80% tidal volume 445 PEEP  . General: Comfortable at this time . Eyes: Grossly normal lids, irises & conjunctiva . ENT: grossly tongue is normal . Neck: no obvious mass . Cardiovascular: S1 S2 normal no gallop . Respiratory: No rhonchi noted at this time . Abdomen: soft . Skin: no rash seen on limited exam . Musculoskeletal: not rigid . Psychiatric:unable to assess . Neurologic: no seizure no involuntary movements         Lab Data:   Basic Metabolic Panel: Recent Labs  Lab 09/03/20 0428 09/06/20 1848  NA 137 138  K 4.2 5.1  CL 93* 93*  CO2 35* 38*  GLUCOSE 226* 276*  BUN 30* 66*  CREATININE 0.53 0.75  CALCIUM 9.3 9.2  MG 2.3 2.6*  PHOS 5.4*  --     ABG: Recent Labs  Lab 09/02/20 1533  PHART 7.343*  PCO2ART 70.8*  PO2ART 62.9*  HCO3 38.0*  O2SAT 91.6    Liver Function Tests: Recent Labs  Lab 09/03/20 0428  AST 48*  ALT 56*  ALKPHOS 123  BILITOT 0.9  PROT 7.5  ALBUMIN 3.0*   No results for input(s): LIPASE, AMYLASE in the last 168 hours. No results for input(s): AMMONIA in the last 168 hours.  CBC: Recent Labs  Lab 09/03/20 0428 09/06/20 1848  WBC 12.5* 22.4*  NEUTROABS 10.3*  --   HGB 7.6* 6.6*  HCT 24.8* 22.2*  MCV 107.8* 112.1*  PLT 187 258    Cardiac Enzymes: No results for  input(s): CKTOTAL, CKMB, CKMBINDEX, TROPONINI in the last 168 hours.  BNP (last 3 results) No results for input(s): BNP in the last 8760 hours.  ProBNP (last 3 results) No results for input(s): PROBNP in the last 8760 hours.  Radiological Exams: DG CHEST PORT 1 VIEW  Result Date: 09/05/2020 CLINICAL DATA:  Pulmonary edema.  Pneumonia. EXAM: PORTABLE CHEST 1 VIEW COMPARISON:  09/02/2020 FINDINGS: A tracheostomy tube overlies the airway. The right PICC has been removed. The cardiac silhouette is largely obscured. Widespread bilateral airspace opacities demonstrate mild worsening in the right lower lung and have not significantly changed on the left. No large pleural effusion or pneumothorax is identified. IMPRESSION: Mild interval worsening of widespread airspace disease. Electronically Signed   By: Logan Bores M.D.   On: 09/05/2020 16:51    Assessment/Plan Active Problems:   Acute on chronic respiratory failure with hypoxia ([place])   COVID-19 virus infection   Pneumonia due to COVID-19 virus   History of pulmonary embolism   Severe sepsis ([place])   1. Acute on chronic respiratory failure with hypoxia patient remains on the ventilator for support echocardiogram was done yesterday awaiting results patient still has a 80% FiO2 requirements not a candidate for weaning 2. COVID-19 virus infection treated in resolution we will continue to monitor 3. Pneumonia due to COVID-19  slow improvement 4. Pulmonary embolism treated 5. Severe sepsis resolved hemodynamics are stable   I have personally seen and evaluated the patient, evaluated laboratory and imaging results, formulated the assessment and plan and placed orders. The Patient requires high complexity decision making with multiple systems involvement.  Rounds were done with the Respiratory Therapy Director and Staff therapists and discussed with nursing staff also.  [name], MD [facility] Pulmonary Critical Care Medicine Sleep Medicine

## 2020-09-08 ENCOUNTER — Other Ambulatory Visit (HOSPITAL_COMMUNITY): Payer: Medicare Other

## 2020-09-08 DIAGNOSIS — A419 Sepsis, unspecified organism: Secondary | ICD-10-CM | POA: Diagnosis not present

## 2020-09-08 DIAGNOSIS — J9621 Acute and chronic respiratory failure with hypoxia: Secondary | ICD-10-CM | POA: Diagnosis not present

## 2020-09-08 DIAGNOSIS — Z86711 Personal history of pulmonary embolism: Secondary | ICD-10-CM | POA: Diagnosis not present

## 2020-09-08 DIAGNOSIS — U071 COVID-19: Secondary | ICD-10-CM | POA: Diagnosis not present

## 2020-09-08 LAB — TYPE AND SCREEN
ABO/RH(D): A POS
Antibody Screen: NEGATIVE
Unit division: 0
Unit division: 0

## 2020-09-08 LAB — CBC
HCT: 29.7 % — ABNORMAL LOW (ref 36.0–46.0)
Hemoglobin: 9.5 g/dL — ABNORMAL LOW (ref 12.0–15.0)
MCH: 34.3 pg — ABNORMAL HIGH (ref 26.0–34.0)
MCHC: 32 g/dL (ref 30.0–36.0)
MCV: 107.2 fL — ABNORMAL HIGH (ref 80.0–100.0)
Platelets: 215 10*3/uL (ref 150–400)
RBC: 2.77 MIL/uL — ABNORMAL LOW (ref 3.87–5.11)
RDW: 22.8 % — ABNORMAL HIGH (ref 11.5–15.5)
WBC: 16.6 10*3/uL — ABNORMAL HIGH (ref 4.0–10.5)
nRBC: 4.2 % — ABNORMAL HIGH (ref 0.0–0.2)

## 2020-09-08 LAB — BPAM RBC
Blood Product Expiration Date: 202112112359
Blood Product Expiration Date: 202112112359
ISSUE DATE / TIME: 202111132352
ISSUE DATE / TIME: 202111140451
Unit Type and Rh: 6200
Unit Type and Rh: 6200

## 2020-09-08 LAB — CULTURE, RESPIRATORY W GRAM STAIN

## 2020-09-08 LAB — BASIC METABOLIC PANEL
Anion gap: 8 (ref 5–15)
BUN: 63 mg/dL — ABNORMAL HIGH (ref 8–23)
CO2: 40 mmol/L — ABNORMAL HIGH (ref 22–32)
Calcium: 8.6 mg/dL — ABNORMAL LOW (ref 8.9–10.3)
Chloride: 95 mmol/L — ABNORMAL LOW (ref 98–111)
Creatinine, Ser: 0.66 mg/dL (ref 0.44–1.00)
GFR, Estimated: >60 mL/min (ref >60)
Glucose, Bld: 179 mg/dL — ABNORMAL HIGH (ref 70–99)
Potassium: 4.5 mmol/L (ref 3.5–5.1)
Sodium: 143 mmol/L (ref 135–145)

## 2020-09-08 MED ORDER — IOHEXOL 300 MG/ML  SOLN
75.0000 mL | Freq: Once | INTRAMUSCULAR | Status: AC | PRN
  Administered 2020-09-08: 75 mL via INTRAVENOUS

## 2020-09-08 NOTE — Progress Notes (Signed)
[place]   PULMONARY CRITICAL CARE SERVICE  PROGRESS NOTE  Date of Service: 09/08/2020  Marie Chavez  WFU:[id number]  DOB: May 14, 1956   DOA: 09/02/2020  Referring Physician: Merton Border, MD  HPI: Marie Chavez is a 64 y.o. female seen for follow up of Acute on Chronic Respiratory Failure.  She remains on high oxygen concentrations at this time.  Chest x-ray shows diffuse infiltrates also did receive some blood she had been on anticoagulation possibility of alveolar hemorrhage should be considered  Medications: Reviewed on Rounds  Physical Exam:  Vitals: Temperature is 96.4 pulse 66 respiratory rate 17 blood pressure is 141/75 saturations 96%  Ventilator Settings on assist control FiO2 is 80% tidal volume 440 PEEP 8  . General: Comfortable at this time . Eyes: Grossly normal lids, irises & conjunctiva . ENT: grossly tongue is normal . Neck: no obvious mass . Cardiovascular: S1 S2 normal no gallop . Respiratory: No rhonchi no rales are noted at this time . Abdomen: soft . Skin: no rash seen on limited exam . Musculoskeletal: not rigid . Psychiatric:unable to assess . Neurologic: no seizure no involuntary movements         Lab Data:   Basic Metabolic Panel: Recent Labs  Lab 09/03/20 0428 09/06/20 1848 09/07/20 1033  NA 137 138 139  K 4.2 5.1 5.0  CL 93* 93* 92*  CO2 35* 38* 37*  GLUCOSE 226* 276* 266*  BUN 30* 66* 69*  CREATININE 0.53 0.75 0.75  CALCIUM 9.3 9.2 9.0  MG 2.3 2.6* 2.6*  PHOS 5.4*  --   --     ABG: Recent Labs  Lab 09/02/20 1533  PHART 7.343*  PCO2ART 70.8*  PO2ART 62.9*  HCO3 38.0*  O2SAT 91.6    Liver Function Tests: Recent Labs  Lab 09/03/20 0428  AST 48*  ALT 56*  ALKPHOS 123  BILITOT 0.9  PROT 7.5  ALBUMIN 3.0*   No results for input(s): LIPASE, AMYLASE in the last 168 hours. No results for input(s): AMMONIA in the last 168 hours.  CBC: Recent Labs  Lab 09/03/20 0428 09/06/20 1848  09/07/20 1033  WBC 12.5* 22.4* 19.6*  NEUTROABS 10.3*  --   --   HGB 7.6* 6.6* 9.2*  HCT 24.8* 22.2* 28.2*  MCV 107.8* 112.1* 103.3*  PLT 187 258 242    Cardiac Enzymes: No results for input(s): CKTOTAL, CKMB, CKMBINDEX, TROPONINI in the last 168 hours.  BNP (last 3 results) No results for input(s): BNP in the last 8760 hours.  ProBNP (last 3 results) No results for input(s): PROBNP in the last 8760 hours.  Radiological Exams: DG CHEST PORT 1 VIEW  Result Date: 09/08/2020 CLINICAL DATA:  Hypoxemia and pneumonia EXAM: PORTABLE CHEST 1 VIEW COMPARISON:  Three days ago FINDINGS: Stable diffuse airspace opacity with partially obscured cardiomegaly. Tracheostomy tube remains in place. No visible air leak. IMPRESSION: Stable confluent pneumonia and cardiomegaly. Electronically Signed   By: Monte Fantasia M.D.   On: 09/08/2020 06:12    Assessment/Plan Active Problems:   Acute on chronic respiratory failure with hypoxia ([place])   COVID-19 virus infection   Pneumonia due to COVID-19 virus   History of pulmonary embolism   Severe sepsis ([place])   1. Acute on chronic respiratory failure hypoxia patient remains on 80% FiO2 anticoagulation has been stopped.  The patient's hemoglobin will be monitored for any further drop 2. COVID-19 virus infection in recovery continue supportive care 3. Pneumonia due to COVID-19 chest x-ray  still shows diffuse infiltrates likely explanation for the severe hypoxia 4. History of pulmonary embolism with a drop in hemoglobin had to stop anticoagulation 5. Severe sepsis treated resolved   I have personally seen and evaluated the patient, evaluated laboratory and imaging results, formulated the assessment and plan and placed orders. The Patient requires high complexity decision making with multiple systems involvement.  Rounds were done with the Respiratory Therapy Director and Staff therapists and discussed with nursing staff also.  [name], MD  [facility] Pulmonary Critical Care Medicine Sleep Medicine

## 2020-09-09 DIAGNOSIS — U071 COVID-19: Secondary | ICD-10-CM | POA: Diagnosis not present

## 2020-09-09 DIAGNOSIS — J9621 Acute and chronic respiratory failure with hypoxia: Secondary | ICD-10-CM | POA: Diagnosis not present

## 2020-09-09 DIAGNOSIS — Z86711 Personal history of pulmonary embolism: Secondary | ICD-10-CM | POA: Diagnosis not present

## 2020-09-09 DIAGNOSIS — A419 Sepsis, unspecified organism: Secondary | ICD-10-CM | POA: Diagnosis not present

## 2020-09-09 LAB — BASIC METABOLIC PANEL
Anion gap: 14 (ref 5–15)
BUN: 64 mg/dL — ABNORMAL HIGH (ref 8–23)
CO2: 35 mmol/L — ABNORMAL HIGH (ref 22–32)
Calcium: 8.8 mg/dL — ABNORMAL LOW (ref 8.9–10.3)
Chloride: 96 mmol/L — ABNORMAL LOW (ref 98–111)
Creatinine, Ser: 0.59 mg/dL (ref 0.44–1.00)
GFR, Estimated: >60 mL/min (ref >60)
Glucose, Bld: 217 mg/dL — ABNORMAL HIGH (ref 70–99)
Potassium: 4.9 mmol/L (ref 3.5–5.1)
Sodium: 145 mmol/L (ref 135–145)

## 2020-09-09 LAB — CBC
HCT: 29.2 % — ABNORMAL LOW (ref 36.0–46.0)
Hemoglobin: 9.2 g/dL — ABNORMAL LOW (ref 12.0–15.0)
MCH: 34.3 pg — ABNORMAL HIGH (ref 26.0–34.0)
MCHC: 31.5 g/dL (ref 30.0–36.0)
MCV: 109 fL — ABNORMAL HIGH (ref 80.0–100.0)
Platelets: 223 10*3/uL (ref 150–400)
RBC: 2.68 MIL/uL — ABNORMAL LOW (ref 3.87–5.11)
RDW: 22.8 % — ABNORMAL HIGH (ref 11.5–15.5)
WBC: 15.2 10*3/uL — ABNORMAL HIGH (ref 4.0–10.5)
nRBC: 4.3 % — ABNORMAL HIGH (ref 0.0–0.2)

## 2020-09-09 LAB — ECHOCARDIOGRAM COMPLETE
Area-P 1/2: 3.37 cm2
S' Lateral: 3.4 cm

## 2020-09-09 LAB — BRAIN NATRIURETIC PEPTIDE: B Natriuretic Peptide: 137.4 pg/mL — ABNORMAL HIGH (ref 0.0–100.0)

## 2020-09-09 NOTE — Progress Notes (Signed)
Pulmonary Critical Care Medicine [place]   PULMONARY CRITICAL CARE SERVICE  PROGRESS NOTE  Date of Service: 09/09/2020  Marie Chavez  OZD:[id number]  DOB: 01-28-1956   DOA: 09/02/2020  Referring Physician: Merton Border, MD  HPI: Marie Chavez is a 64 y.o. female seen for follow up of Acute on Chronic Respiratory Failure.  Patient remains on assist control mode has been on 80% FiO2.  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 and a diffuse pulmonary parenchymal changes  Medications: Reviewed on Rounds  Physical Exam:  Vitals: Temperature 96.4 pulse 72 respiratory rate 25 blood pressure is 143/72 saturations 96%  Ventilator Settings on assist control FiO2 80% tidal volume 462 PEEP 8   General: Comfortable at this time  Eyes: Grossly normal lids, irises & conjunctiva  ENT: grossly tongue is normal  Neck: no obvious mass  Cardiovascular: S1 S2 normal no gallop  Respiratory: No rhonchi very coarse breath sounds  Abdomen: soft  Skin: no rash seen on limited exam  Musculoskeletal: not rigid  Psychiatric:unable to assess  Neurologic: no seizure no involuntary movements         Lab Data:   Basic Metabolic Panel: Recent Labs  Lab 09/03/20 0428 09/06/20 1848 09/07/20 1033 09/08/20 1300 09/09/20 0623  NA 137 138 139 143 145  K 4.2 5.1 5.0 4.5 4.9  CL 93* 93* 92* 95* 96*  CO2 35* 38* 37* 40* 35*  GLUCOSE 226* 276* 266* 179* 217*  BUN 30* 66* 69* 63* 64*  CREATININE 0.53 0.75 0.75 0.66 0.59  CALCIUM 9.3 9.2 9.0 8.6* 8.8*  MG 2.3 2.6* 2.6*  --   --   PHOS 5.4*  --   --   --   --     ABG: No results for input(s): PHART, PCO2ART, PO2ART, HCO3, O2SAT in the last 168 hours.  Liver Function Tests: Recent Labs  Lab 09/03/20 0428  AST 48*  ALT 56*  ALKPHOS 123  BILITOT 0.9  PROT 7.5  ALBUMIN 3.0*   No  results for input(s): LIPASE, AMYLASE in the last 168 hours. No results for input(s): AMMONIA in the last 168 hours.  CBC: Recent Labs  Lab 09/03/20 0428 09/06/20 1848 09/07/20 1033 09/08/20 1449 09/09/20 0623  WBC 12.5* 22.4* 19.6* 16.6* 15.2*  NEUTROABS 10.3*  --   --   --   --   HGB 7.6* 6.6* 9.2* 9.5* 9.2*  HCT 24.8* 22.2* 28.2* 29.7* 29.2*  MCV 107.8* 112.1* 103.3* 107.2* 109.0*  PLT 187 258 242 215 223    Cardiac Enzymes: No results for input(s): CKTOTAL, CKMB, CKMBINDEX, TROPONINI in the last 168 hours.  BNP (last 3 results) Recent Labs    09/09/20 0623  BNP 137.4*    ProBNP (last 3 results) No results for input(s): PROBNP in the last 8760 hours.  Radiological Exams: CT CHEST W CONTRAST  Result Date: 09/08/2020 CLINICAL DATA:  Respiratory failure. EXAM: CT CHEST WITH CONTRAST TECHNIQUE: Multidetector CT imaging of the chest was performed during intravenous contrast administration. CONTRAST:  62mL OMNIPAQUE IOHEXOL 300 MG/ML  SOLN COMPARISON:  07/28/2020 FINDINGS: Exam quite limited due to body habitus. Cardiovascular: The heart is borderline enlarged but stable. No pericardial effusion. Stable aortic calcifications. No aortic dissection or focal aneurysm. The pulmonary arteries are slightly prominent but appears stable. No obvious pulmonary embolism. Mediastinum/Nodes: Scattered mediastinal and hilar  lymph nodes are stable. These are likely reactive. The esophagus is grossly normal. Lungs/Pleura: Severe diffuse airspace process, not significantly changed since prior study no definite pleural effusions. No pneumothorax. Upper Abdomen: The feeding gastrostomy tube appears to be in good position. The wall of the stomach is probably stretched over the bulb. Musculoskeletal: No significant bony findings. IMPRESSION: 1. Very limited examination due to body habitus. 2. Severe diffuse airspace process, not significantly changed since prior study. 3. No pneumothorax. Electronically  Signed   By: Marijo Sanes M.D.   On: 09/08/2020 16:08   DG CHEST PORT 1 VIEW  Result Date: 09/08/2020 CLINICAL DATA:  Hypoxemia and pneumonia EXAM: PORTABLE CHEST 1 VIEW COMPARISON:  Three days ago FINDINGS: Stable diffuse airspace opacity with partially obscured cardiomegaly. Tracheostomy tube remains in place. No visible air leak. IMPRESSION: Stable confluent pneumonia and cardiomegaly. Electronically Signed   By: Monte Fantasia M.D.   On: 09/08/2020 06:12    Assessment/Plan Active Problems:   Acute on chronic respiratory failure with hypoxia ([place])   COVID-19 virus infection   Pneumonia due to COVID-19 virus   History of pulmonary embolism   Severe sepsis ([place])   1. Acute on chronic respiratory failure with hypoxia patient currently is on assist control mode has been on 80% FiO2 appears to be not able to come down on the FiO2 patient's chest x-ray still shows no significant improvement and CT findings as discussed. 2. COVID-19 virus infection in recovery we will continue to monitor closely. 3. Pneumonia due to COVID-19 treated has been in resolution 4. History of pulmonary embolism treated 5. Severe sepsis resolved 6. ARDS patient is behaving at ARDS physiology   I have personally seen and evaluated the patient, evaluated laboratory and imaging results, formulated the assessment and plan and placed orders. The Patient requires high complexity decision making with multiple systems involvement.  Rounds were done with the Respiratory Therapy Director and Staff therapists and discussed with nursing staff also.  [name], MD [facility] Pulmonary Critical Care Medicine Sleep Medicine

## 2020-09-10 DIAGNOSIS — U071 COVID-19: Secondary | ICD-10-CM | POA: Diagnosis not present

## 2020-09-10 DIAGNOSIS — J9621 Acute and chronic respiratory failure with hypoxia: Secondary | ICD-10-CM | POA: Diagnosis not present

## 2020-09-10 DIAGNOSIS — Z86711 Personal history of pulmonary embolism: Secondary | ICD-10-CM | POA: Diagnosis not present

## 2020-09-10 DIAGNOSIS — A419 Sepsis, unspecified organism: Secondary | ICD-10-CM | POA: Diagnosis not present

## 2020-09-10 NOTE — Progress Notes (Signed)
Pulmonary Critical Care Medicine [place]   PULMONARY CRITICAL CARE SERVICE  PROGRESS NOTE  Date of Service: 09/10/2020  Marie Chavez  CHY:[id number]  DOB: Mar 06, 1956   DOA: 09/02/2020  Referring Physician: Merton Border, MD  HPI: Marie Chavez is a 64 y.o. female seen for follow up of Acute on Chronic Respiratory Failure.  At this time patient is on assist control mode has been on 75% FiO2  Medications: Reviewed on Rounds  Physical Exam:  Vitals: Temperature 96.9 pulse 80 respiratory rate 30 blood pressure is 118/71 saturations 100%  Ventilator Settings on assist control FiO2 of 75%  . General: Comfortable at this time . Eyes: Grossly normal lids, irises & conjunctiva . ENT: grossly tongue is normal . Neck: no obvious mass . Cardiovascular: S1 S2 normal no gallop . Respiratory: No rhonchi no rales are noted at this time . Abdomen: soft . Skin: no rash seen on limited exam . Musculoskeletal: not rigid . Psychiatric:unable to assess . Neurologic: no seizure no involuntary movements         Lab Data:   Basic Metabolic Panel: Recent Labs  Lab 09/06/20 1848 09/07/20 1033 09/08/20 1300 09/09/20 0623  NA 138 139 143 145  K 5.1 5.0 4.5 4.9  CL 93* 92* 95* 96*  CO2 38* 37* 40* 35*  GLUCOSE 276* 266* 179* 217*  BUN 66* 69* 63* 64*  CREATININE 0.75 0.75 0.66 0.59  CALCIUM 9.2 9.0 8.6* 8.8*  MG 2.6* 2.6*  --   --     ABG: No results for input(s): PHART, PCO2ART, PO2ART, HCO3, O2SAT in the last 168 hours.  Liver Function Tests: No results for input(s): AST, ALT, ALKPHOS, BILITOT, PROT, ALBUMIN in the last 168 hours. No results for input(s): LIPASE, AMYLASE in the last 168 hours. No results for input(s): AMMONIA in the last 168 hours.  CBC: Recent Labs  Lab 09/06/20 1848 09/07/20 1033 09/08/20 1449 09/09/20 0623  WBC 22.4* 19.6* 16.6* 15.2*  HGB 6.6* 9.2* 9.5* 9.2*  HCT 22.2* 28.2* 29.7* 29.2*  MCV 112.1* 103.3* 107.2* 109.0*  PLT  258 242 215 223    Cardiac Enzymes: No results for input(s): CKTOTAL, CKMB, CKMBINDEX, TROPONINI in the last 168 hours.  BNP (last 3 results) Recent Labs    09/09/20 0623  BNP 137.4*    ProBNP (last 3 results) No results for input(s): PROBNP in the last 8760 hours.  Radiological Exams: CT CHEST W CONTRAST  Result Date: 09/08/2020 CLINICAL DATA:  Respiratory failure. EXAM: CT CHEST WITH CONTRAST TECHNIQUE: Multidetector CT imaging of the chest was performed during intravenous contrast administration. CONTRAST:  80mL OMNIPAQUE IOHEXOL 300 MG/ML  SOLN COMPARISON:  07/28/2020 FINDINGS: Exam quite limited due to body habitus. Cardiovascular: The heart is borderline enlarged but stable. No pericardial effusion. Stable aortic calcifications. No aortic dissection or focal aneurysm. The pulmonary arteries are slightly prominent but appears stable. No obvious pulmonary embolism. Mediastinum/Nodes: Scattered mediastinal and hilar lymph nodes are stable. These are likely reactive. The esophagus is grossly normal. Lungs/Pleura: Severe diffuse airspace process, not significantly changed since prior study no definite pleural effusions. No pneumothorax. Upper Abdomen: The feeding gastrostomy tube appears to be in good position. The wall of the stomach is probably stretched over the bulb. Musculoskeletal: No significant bony findings. IMPRESSION: 1. Very limited examination due to body habitus. 2. Severe diffuse airspace process, not significantly changed since prior study. 3. No pneumothorax. Electronically Signed   By: Ricky Stabs.D.  On: 09/08/2020 16:08    Assessment/Plan Active Problems:   Acute on chronic respiratory failure with hypoxia ([place])   COVID-19 virus infection   Pneumonia due to COVID-19 virus   History of pulmonary embolism   Severe sepsis ([place])   1. Acute on chronic respiratory failure with hypoxia we'll continue with full support on assist control 2. COVID-19 virus infection  in recovery we'll continue to follow 3. Pneumonia due to COVID-19 treated 4. History of pulmonary embolism supportive care and right now 5. Severe sepsis resolved   I have personally seen and evaluated the patient, evaluated laboratory and imaging results, formulated the assessment and plan and placed orders. The Patient requires high complexity decision making with multiple systems involvement.  Rounds were done with the Respiratory Therapy Director and Staff therapists and discussed with nursing staff also.  [name], MD [facility] Pulmonary Critical Care Medicine Sleep Medicine

## 2020-09-11 DIAGNOSIS — A419 Sepsis, unspecified organism: Secondary | ICD-10-CM | POA: Diagnosis not present

## 2020-09-11 DIAGNOSIS — Z86711 Personal history of pulmonary embolism: Secondary | ICD-10-CM | POA: Diagnosis not present

## 2020-09-11 DIAGNOSIS — U071 COVID-19: Secondary | ICD-10-CM | POA: Diagnosis not present

## 2020-09-11 DIAGNOSIS — J9621 Acute and chronic respiratory failure with hypoxia: Secondary | ICD-10-CM | POA: Diagnosis not present

## 2020-09-11 NOTE — Progress Notes (Signed)
Pulmonary Critical Care Medicine [place]   PULMONARY CRITICAL CARE SERVICE  PROGRESS NOTE  Date of Service: 09/11/2020  Marie Chavez  [id number]:[id number]  DOB: [birth year]   DOA: 09/02/2020  Referring Physician: Merton Border, MD  HPI: Marie Chavez is a 64 y.o. female seen for follow up of Acute on Chronic Respiratory Failure.  Remains on pressure support currently on 45% FiO2 has been on pressure 12/7  Medications: Reviewed on Rounds  Physical Exam:  Vitals: Temperature is 96.9 pulse 83 respiratory 18 blood pressure is 136/71 saturations 95%  Ventilator Settings on pressure support FiO2 45% pressure 12/7 tidal volume 600  . General: Comfortable at this time . Eyes: Grossly normal lids, irises & conjunctiva . ENT: grossly tongue is normal . Neck: no obvious mass . Cardiovascular: S1 S2 normal no gallop . Respiratory: No rhonchi very coarse breath sounds . Abdomen: soft . Skin: no rash seen on limited exam . Musculoskeletal: not rigid . Psychiatric:unable to assess . Neurologic: no seizure no involuntary movements         Lab Data:   Basic Metabolic Panel: Recent Labs  Lab 09/06/20 1848 09/07/20 1033 09/08/20 1300 09/09/20 0623  NA 138 139 143 145  K 5.1 5.0 4.5 4.9  CL 93* 92* 95* 96*  CO2 38* 37* 40* 35*  GLUCOSE 276* 266* 179* 217*  BUN 66* 69* 63* 64*  CREATININE 0.75 0.75 0.66 0.59  CALCIUM 9.2 9.0 8.6* 8.8*  MG 2.6* 2.6*  --   --     ABG: No results for input(s): PHART, PCO2ART, PO2ART, HCO3, O2SAT in the last 168 hours.  Liver Function Tests: No results for input(s): AST, ALT, ALKPHOS, BILITOT, PROT, ALBUMIN in the last 168 hours. No results for input(s): LIPASE, AMYLASE in the last 168 hours. No results for input(s): AMMONIA in the last 168 hours.  CBC: Recent Labs  Lab 09/06/20 1848 09/07/20 1033 09/08/20 1449 09/09/20 0623  WBC 22.4* 19.6* 16.6* 15.2*  HGB 6.6* 9.2* 9.5* 9.2*  HCT 22.2* 28.2* 29.7* 29.2*  MCV  112.1* 103.3* 107.2* 109.0*  PLT 258 242 215 223    Cardiac Enzymes: No results for input(s): CKTOTAL, CKMB, CKMBINDEX, TROPONINI in the last 168 hours.  BNP (last 3 results) Recent Labs    09/09/20 0623  BNP 137.4*    ProBNP (last 3 results) No results for input(s): PROBNP in the last 8760 hours.  Radiological Exams: No results found.  Assessment/Plan Active Problems:   Acute on chronic respiratory failure with hypoxia ([place])   COVID-19 virus infection   Pneumonia due to COVID-19 virus   History of pulmonary embolism   Severe sepsis ([place])   1. Acute on chronic respiratory failure with oxygen continue with pressure support titrate oxygen continue pulmonary toilet. 2. COVID-19 virus infection in recovery we will continue to follow 3. Pneumonia due to COVID-19 treated 4. History of pulmonary embolism at baseline 5. Severe sepsis resolved   I have personally seen and evaluated the patient, evaluated laboratory and imaging results, formulated the assessment and plan and placed orders. The Patient requires high complexity decision making with multiple systems involvement.  Rounds were done with the Respiratory Therapy Director and Staff therapists and discussed with nursing staff also.  [name], MD [facility] Pulmonary Critical Care Medicine Sleep Medicine

## 2020-09-12 ENCOUNTER — Other Ambulatory Visit (HOSPITAL_COMMUNITY): Payer: Medicare Other

## 2020-09-12 DIAGNOSIS — U071 COVID-19: Secondary | ICD-10-CM | POA: Diagnosis not present

## 2020-09-12 DIAGNOSIS — A419 Sepsis, unspecified organism: Secondary | ICD-10-CM | POA: Diagnosis not present

## 2020-09-12 DIAGNOSIS — Z86711 Personal history of pulmonary embolism: Secondary | ICD-10-CM | POA: Diagnosis not present

## 2020-09-12 DIAGNOSIS — J9621 Acute and chronic respiratory failure with hypoxia: Secondary | ICD-10-CM | POA: Diagnosis not present

## 2020-09-12 LAB — BASIC METABOLIC PANEL
Anion gap: 10 (ref 5–15)
BUN: 56 mg/dL — ABNORMAL HIGH (ref 8–23)
CO2: 36 mmol/L — ABNORMAL HIGH (ref 22–32)
Calcium: 8.5 mg/dL — ABNORMAL LOW (ref 8.9–10.3)
Chloride: 104 mmol/L (ref 98–111)
Creatinine, Ser: 0.66 mg/dL (ref 0.44–1.00)
GFR, Estimated: >60 mL/min (ref >60)
Glucose, Bld: 239 mg/dL — ABNORMAL HIGH (ref 70–99)
Potassium: 3.7 mmol/L (ref 3.5–5.1)
Sodium: 150 mmol/L — ABNORMAL HIGH (ref 135–145)

## 2020-09-12 LAB — CBC
HCT: 26.2 % — ABNORMAL LOW (ref 36.0–46.0)
Hemoglobin: 7.7 g/dL — ABNORMAL LOW (ref 12.0–15.0)
MCH: 33.6 pg (ref 26.0–34.0)
MCHC: 29.4 g/dL — ABNORMAL LOW (ref 30.0–36.0)
MCV: 114.4 fL — ABNORMAL HIGH (ref 80.0–100.0)
Platelets: 149 10*3/uL — ABNORMAL LOW (ref 150–400)
RBC: 2.29 MIL/uL — ABNORMAL LOW (ref 3.87–5.11)
RDW: 25.2 % — ABNORMAL HIGH (ref 11.5–15.5)
WBC: 13.3 10*3/uL — ABNORMAL HIGH (ref 4.0–10.5)
nRBC: 1 % — ABNORMAL HIGH (ref 0.0–0.2)

## 2020-09-12 NOTE — Progress Notes (Signed)
Pulmonary Critical Care Medicine [place]   PULMONARY CRITICAL CARE SERVICE  PROGRESS NOTE  Date of Service: 09/12/2020  VARVARA LEGAULT  GQQ:[id number]  DOB: 24-May-1956   DOA: 09/02/2020  Referring Physician: Merton Border, MD  HPI: TULA SCHRYVER is a 64 y.o. female seen for follow up of Acute on Chronic Respiratory Failure.  Patient is on pressure support currently has been on 45% FiO2  Medications: Reviewed on Rounds  Physical Exam:  Vitals: Temperature is 96.9 pulse 83 respiratory rate 15 blood pressure is 124/69 saturations 100%  Ventilator Settings on pressure support FiO2 is 45%  . General: Comfortable at this time . Eyes: Grossly normal lids, irises & conjunctiva . ENT: grossly tongue is normal . Neck: no obvious mass . Cardiovascular: S1 S2 normal no gallop . Respiratory: No rhonchi very coarse breath sounds . Abdomen: soft . Skin: no rash seen on limited exam . Musculoskeletal: not rigid . Psychiatric:unable to assess . Neurologic: no seizure no involuntary movements         Lab Data:   Basic Metabolic Panel: Recent Labs  Lab 09/06/20 1848 09/07/20 1033 09/08/20 1300 09/09/20 0623 09/12/20 0340  NA 138 139 143 145 150*  K 5.1 5.0 4.5 4.9 3.7  CL 93* 92* 95* 96* 104  CO2 38* 37* 40* 35* 36*  GLUCOSE 276* 266* 179* 217* 239*  BUN 66* 69* 63* 64* 56*  CREATININE 0.75 0.75 0.66 0.59 0.66  CALCIUM 9.2 9.0 8.6* 8.8* 8.5*  MG 2.6* 2.6*  --   --   --     ABG: No results for input(s): PHART, PCO2ART, PO2ART, HCO3, O2SAT in the last 168 hours.  Liver Function Tests: No results for input(s): AST, ALT, ALKPHOS, BILITOT, PROT, ALBUMIN in the last 168 hours. No results for input(s): LIPASE, AMYLASE in the last 168 hours. No results for input(s): AMMONIA in the last 168 hours.  CBC: Recent Labs  Lab 09/06/20 1848 09/07/20 1033 09/08/20 1449 09/09/20 0623 09/12/20 0340  WBC 22.4* 19.6* 16.6* 15.2* 13.3*  HGB 6.6* 9.2* 9.5* 9.2*  7.7*  HCT 22.2* 28.2* 29.7* 29.2* 26.2*  MCV 112.1* 103.3* 107.2* 109.0* 114.4*  PLT 258 242 215 223 149*    Cardiac Enzymes: No results for input(s): CKTOTAL, CKMB, CKMBINDEX, TROPONINI in the last 168 hours.  BNP (last 3 results) Recent Labs    09/09/20 0623  BNP 137.4*    ProBNP (last 3 results) No results for input(s): PROBNP in the last 8760 hours.  Radiological Exams: DG Chest Port 1 View  Result Date: 09/12/2020 CLINICAL DATA:  Pneumonia.  Tracheostomy. EXAM: PORTABLE CHEST 1 VIEW COMPARISON:  Chest 09/08/2020, 09/02/2020.  CT 09/08/2020. FINDINGS: Tracheostomy tube in stable position. Heart size stable. Diffuse bilateral pulmonary infiltrates/edema again noted without significant interim change. No pleural effusion or pneumothorax. Old left rib fractures again noted. Prior cervical spine fusion. IMPRESSION: 1.  Tracheostomy tube in stable position. 2. Diffuse bilateral pulmonary infiltrates/edema again noted without significant interim change. Electronically Signed   By: Marcello Moores  Register   On: 09/12/2020 06:43    Assessment/Plan Active Problems:   Acute on chronic respiratory failure with hypoxia ([place])   COVID-19 virus infection   Pneumonia due to COVID-19 virus   History of pulmonary embolism   Severe sepsis ([place])   1. Acute on chronic respiratory failure hypoxia plan is to continue with weaning advance to T collar 2. COVID-19 virus infection in recovery 3. Pneumonia due to COVID-19 treated 4. Pulmonary  embolism treated 5. Severe sepsis resolved   I have personally seen and evaluated the patient, evaluated laboratory and imaging results, formulated the assessment and plan and placed orders. The Patient requires high complexity decision making with multiple systems involvement.  Rounds were done with the Respiratory Therapy Director and Staff therapists and discussed with nursing staff also.  [name], MD [facility] Pulmonary Critical Care Medicine Sleep  Medicine

## 2020-09-13 DIAGNOSIS — U071 COVID-19: Secondary | ICD-10-CM | POA: Diagnosis not present

## 2020-09-13 DIAGNOSIS — A419 Sepsis, unspecified organism: Secondary | ICD-10-CM | POA: Diagnosis not present

## 2020-09-13 DIAGNOSIS — J9621 Acute and chronic respiratory failure with hypoxia: Secondary | ICD-10-CM | POA: Diagnosis not present

## 2020-09-13 DIAGNOSIS — Z86711 Personal history of pulmonary embolism: Secondary | ICD-10-CM | POA: Diagnosis not present

## 2020-09-13 NOTE — Progress Notes (Signed)
Pulmonary Critical Care Medicine [place]   PULMONARY CRITICAL CARE SERVICE  PROGRESS NOTE  Date of Service: 09/13/2020  MAALIYAH ADOLPH  PPI:[id number]  DOB: 08-03-1956   DOA: 09/02/2020  Referring Physician: Merton Border, MD  HPI: Marie Chavez is a 64 y.o. female seen for follow up of Acute on Chronic Respiratory Failure.  She is off ventilator on T collar has been on 40% FiO2 with a goal of 8 hours  Medications: Reviewed on Rounds  Physical Exam:  Vitals: Temperature is 97.2 pulse 84 respiratory rate 28 blood pressure 122/52 saturations 100%  Ventilator Settings off the ventilator on T collar 40% FiO2  . General: Comfortable at this time . Eyes: Grossly normal lids, irises & conjunctiva . ENT: grossly tongue is normal . Neck: no obvious mass . Cardiovascular: S1 S2 normal no gallop . Respiratory: No rhonchi no rales . Abdomen: soft . Skin: no rash seen on limited exam . Musculoskeletal: not rigid . Psychiatric:unable to assess . Neurologic: no seizure no involuntary movements         Lab Data:   Basic Metabolic Panel: Recent Labs  Lab 09/06/20 1848 09/07/20 1033 09/08/20 1300 09/09/20 0623 09/12/20 0340  NA 138 139 143 145 150*  K 5.1 5.0 4.5 4.9 3.7  CL 93* 92* 95* 96* 104  CO2 38* 37* 40* 35* 36*  GLUCOSE 276* 266* 179* 217* 239*  BUN 66* 69* 63* 64* 56*  CREATININE 0.75 0.75 0.66 0.59 0.66  CALCIUM 9.2 9.0 8.6* 8.8* 8.5*  MG 2.6* 2.6*  --   --   --     ABG: No results for input(s): PHART, PCO2ART, PO2ART, HCO3, O2SAT in the last 168 hours.  Liver Function Tests: No results for input(s): AST, ALT, ALKPHOS, BILITOT, PROT, ALBUMIN in the last 168 hours. No results for input(s): LIPASE, AMYLASE in the last 168 hours. No results for input(s): AMMONIA in the last 168 hours.  CBC: Recent Labs  Lab 09/06/20 1848 09/07/20 1033 09/08/20 1449 09/09/20 0623 09/12/20 0340  WBC 22.4* 19.6* 16.6* 15.2* 13.3*  HGB 6.6* 9.2* 9.5*  9.2* 7.7*  HCT 22.2* 28.2* 29.7* 29.2* 26.2*  MCV 112.1* 103.3* 107.2* 109.0* 114.4*  PLT 258 242 215 223 149*    Cardiac Enzymes: No results for input(s): CKTOTAL, CKMB, CKMBINDEX, TROPONINI in the last 168 hours.  BNP (last 3 results) Recent Labs    09/09/20 0623  BNP 137.4*    ProBNP (last 3 results) No results for input(s): PROBNP in the last 8760 hours.  Radiological Exams: DG Chest Port 1 View  Result Date: 09/12/2020 CLINICAL DATA:  Pneumonia.  Tracheostomy. EXAM: PORTABLE CHEST 1 VIEW COMPARISON:  Chest 09/08/2020, 09/02/2020.  CT 09/08/2020. FINDINGS: Tracheostomy tube in stable position. Heart size stable. Diffuse bilateral pulmonary infiltrates/edema again noted without significant interim change. No pleural effusion or pneumothorax. Old left rib fractures again noted. Prior cervical spine fusion. IMPRESSION: 1.  Tracheostomy tube in stable position. 2. Diffuse bilateral pulmonary infiltrates/edema again noted without significant interim change. Electronically Signed   By: Marcello Moores  Register   On: 09/12/2020 06:43    Assessment/Plan Active Problems:   Acute on chronic respiratory failure with hypoxia ([place])   COVID-19 virus infection   Pneumonia due to COVID-19 virus   History of pulmonary embolism   Severe sepsis ([place])   1. Acute on chronic respiratory failure hypoxemic continue with T-piece 8 hours 2. COVID-19 virus infection in recovery 3. Pneumonia due to COVID-19 slow improvement  residual changes still present 4. History of pulmonary embolism treated 5. Severe sepsis treated with   I have personally seen and evaluated the patient, evaluated laboratory and imaging results, formulated the assessment and plan and placed orders. The Patient requires high complexity decision making with multiple systems involvement.  Rounds were done with the Respiratory Therapy Director and Staff therapists and discussed with nursing staff also.  [name], MD  [facility] Pulmonary Critical Care Medicine Sleep Medicine

## 2020-09-14 DIAGNOSIS — J9621 Acute and chronic respiratory failure with hypoxia: Secondary | ICD-10-CM | POA: Diagnosis not present

## 2020-09-14 DIAGNOSIS — Z86711 Personal history of pulmonary embolism: Secondary | ICD-10-CM | POA: Diagnosis not present

## 2020-09-14 DIAGNOSIS — A419 Sepsis, unspecified organism: Secondary | ICD-10-CM | POA: Diagnosis not present

## 2020-09-14 DIAGNOSIS — U071 COVID-19: Secondary | ICD-10-CM | POA: Diagnosis not present

## 2020-09-14 LAB — BASIC METABOLIC PANEL WITH GFR
Anion gap: 11 (ref 5–15)
BUN: 54 mg/dL — ABNORMAL HIGH (ref 8–23)
CO2: 33 mmol/L — ABNORMAL HIGH (ref 22–32)
Calcium: 8.6 mg/dL — ABNORMAL LOW (ref 8.9–10.3)
Chloride: 104 mmol/L (ref 98–111)
Creatinine, Ser: 0.61 mg/dL (ref 0.44–1.00)
GFR, Estimated: >60 mL/min (ref >60)
Glucose, Bld: 185 mg/dL — ABNORMAL HIGH (ref 70–99)
Potassium: 3.4 mmol/L — ABNORMAL LOW (ref 3.5–5.1)
Sodium: 148 mmol/L — ABNORMAL HIGH (ref 135–145)

## 2020-09-14 NOTE — Progress Notes (Signed)
Pulmonary Critical Care Medicine [place]   PULMONARY CRITICAL CARE SERVICE  PROGRESS NOTE  Date of Service: 09/14/2020  Marie Chavez  TGG:[id number]  DOB: Aug 16, 1956   DOA: 09/02/2020  Referring Physician: Merton Border, MD  HPI: Marie Chavez is a 64 y.o. female seen for follow up of Acute on Chronic Respiratory Failure.  Patient currently is on T collar has been on 45% FiO2 good saturations are noted at this time  Medications: Reviewed on Rounds  Physical Exam:  Vitals: Temperature is 97.4 pulse 90 respiratory rate 20 blood pressure is 128/68 saturations 98%  Ventilator Settings T collar FiO2 45%  . General: Comfortable at this time . Eyes: Grossly normal lids, irises & conjunctiva . ENT: grossly tongue is normal . Neck: no obvious mass . Cardiovascular: S1 S2 normal no gallop . Respiratory: No rhonchi no rales noted at this time . Abdomen: soft . Skin: no rash seen on limited exam . Musculoskeletal: not rigid . Psychiatric:unable to assess . Neurologic: no seizure no involuntary movements         Lab Data:   Basic Metabolic Panel: Recent Labs  Lab 09/07/20 1033 09/08/20 1300 09/09/20 0623 09/12/20 0340 09/14/20 0611  NA 139 143 145 150* 148*  K 5.0 4.5 4.9 3.7 3.4*  CL 92* 95* 96* 104 104  CO2 37* 40* 35* 36* 33*  GLUCOSE 266* 179* 217* 239* 185*  BUN 69* 63* 64* 56* 54*  CREATININE 0.75 0.66 0.59 0.66 0.61  CALCIUM 9.0 8.6* 8.8* 8.5* 8.6*  MG 2.6*  --   --   --   --     ABG: No results for input(s): PHART, PCO2ART, PO2ART, HCO3, O2SAT in the last 168 hours.  Liver Function Tests: No results for input(s): AST, ALT, ALKPHOS, BILITOT, PROT, ALBUMIN in the last 168 hours. No results for input(s): LIPASE, AMYLASE in the last 168 hours. No results for input(s): AMMONIA in the last 168 hours.  CBC: Recent Labs  Lab 09/07/20 1033 09/08/20 1449 09/09/20 0623 09/12/20 0340  WBC 19.6* 16.6* 15.2* 13.3*  HGB 9.2* 9.5* 9.2*  7.7*  HCT 28.2* 29.7* 29.2* 26.2*  MCV 103.3* 107.2* 109.0* 114.4*  PLT 242 215 223 149*    Cardiac Enzymes: No results for input(s): CKTOTAL, CKMB, CKMBINDEX, TROPONINI in the last 168 hours.  BNP (last 3 results) Recent Labs    09/09/20 0623  BNP 137.4*    ProBNP (last 3 results) No results for input(s): PROBNP in the last 8760 hours.  Radiological Exams: No results found.  Assessment/Plan Active Problems:   Acute on chronic respiratory failure with hypoxia ([place])   COVID-19 virus infection   Pneumonia due to COVID-19 virus   History of pulmonary embolism   Severe sepsis ([place])   1. Acute on chronic respiratory failure hypoxia plan is to continue with the T-piece wean 12-hour goal 2. COVID-19 virus infection in recovery continue to monitor 3. Pneumonia due to COVID-19 treated 4. History of pulmonary embolism no change 5. Severe sepsis resolved   I have personally seen and evaluated the patient, evaluated laboratory and imaging results, formulated the assessment and plan and placed orders. The Patient requires high complexity decision making with multiple systems involvement.  Rounds were done with the Respiratory Therapy Director and Staff therapists and discussed with nursing staff also.  [name], MD [facility] Pulmonary Critical Care Medicine Sleep Medicine

## 2020-09-15 DIAGNOSIS — Z86711 Personal history of pulmonary embolism: Secondary | ICD-10-CM | POA: Diagnosis not present

## 2020-09-15 DIAGNOSIS — A419 Sepsis, unspecified organism: Secondary | ICD-10-CM | POA: Diagnosis not present

## 2020-09-15 DIAGNOSIS — J9621 Acute and chronic respiratory failure with hypoxia: Secondary | ICD-10-CM | POA: Diagnosis not present

## 2020-09-15 DIAGNOSIS — U071 COVID-19: Secondary | ICD-10-CM | POA: Diagnosis not present

## 2020-09-15 NOTE — Progress Notes (Signed)
Pulmonary Critical Care Medicine [place]   PULMONARY CRITICAL CARE SERVICE  PROGRESS NOTE  Date of Service: 09/15/2020  Marie Chavez  UEA:[id number]  DOB: October 10, 1956   DOA: 09/02/2020  Referring Physician: Merton Border, MD  HPI: Marie Chavez is a 64 y.o. female seen for follow up of Acute on Chronic Respiratory Failure.  Patient currently is on T collar on 40% FiO2  Medications: Reviewed on Rounds  Physical Exam:  Vitals: Temperature is 96.0 pulse 79 respiratory 19 blood pressure is 126/73 saturations 100%  Ventilator Settings on T collar with an FiO2 of 40%  . General: Comfortable at this time . Eyes: Grossly normal lids, irises & conjunctiva . ENT: grossly tongue is normal . Neck: no obvious mass . Cardiovascular: S1 S2 normal no gallop . Respiratory: No rhonchi very coarse breath sounds . Abdomen: soft . Skin: no rash seen on limited exam . Musculoskeletal: not rigid . Psychiatric:unable to assess . Neurologic: no seizure no involuntary movements         Lab Data:   Basic Metabolic Panel: Recent Labs  Lab 09/08/20 1300 09/09/20 0623 09/12/20 0340 09/14/20 0611  NA 143 145 150* 148*  K 4.5 4.9 3.7 3.4*  CL 95* 96* 104 104  CO2 40* 35* 36* 33*  GLUCOSE 179* 217* 239* 185*  BUN 63* 64* 56* 54*  CREATININE 0.66 0.59 0.66 0.61  CALCIUM 8.6* 8.8* 8.5* 8.6*    ABG: No results for input(s): PHART, PCO2ART, PO2ART, HCO3, O2SAT in the last 168 hours.  Liver Function Tests: No results for input(s): AST, ALT, ALKPHOS, BILITOT, PROT, ALBUMIN in the last 168 hours. No results for input(s): LIPASE, AMYLASE in the last 168 hours. No results for input(s): AMMONIA in the last 168 hours.  CBC: Recent Labs  Lab 09/08/20 1449 09/09/20 0623 09/12/20 0340  WBC 16.6* 15.2* 13.3*  HGB 9.5* 9.2* 7.7*  HCT 29.7* 29.2* 26.2*  MCV 107.2* 109.0* 114.4*  PLT 215 223 149*    Cardiac Enzymes: No results for input(s): CKTOTAL, CKMB, CKMBINDEX,  TROPONINI in the last 168 hours.  BNP (last 3 results) Recent Labs    09/09/20 0623  BNP 137.4*    ProBNP (last 3 results) No results for input(s): PROBNP in the last 8760 hours.  Radiological Exams: No results found.  Assessment/Plan Active Problems:   Acute on chronic respiratory failure with hypoxia ([place])   COVID-19 virus infection   Pneumonia due to COVID-19 virus   History of pulmonary embolism   Severe sepsis ([place])   1. Acute on chronic respiratory failure hypoxia we will continue with T collar goal of 16 hours 2. COVID-19 virus infection in recovery we will continue with supportive care 3. Pneumonia due to COVID-19 treated 4. Pulmonary embolism treated we will continue to follow 5. Severe sepsis resolved   I have personally seen and evaluated the patient, evaluated laboratory and imaging results, formulated the assessment and plan and placed orders. The Patient requires high complexity decision making with multiple systems involvement.  Rounds were done with the Respiratory Therapy Director and Staff therapists and discussed with nursing staff also.  [name], MD [facility] Pulmonary Critical Care Medicine Sleep Medicine

## 2020-09-16 DIAGNOSIS — Z86711 Personal history of pulmonary embolism: Secondary | ICD-10-CM | POA: Diagnosis not present

## 2020-09-16 DIAGNOSIS — A419 Sepsis, unspecified organism: Secondary | ICD-10-CM | POA: Diagnosis not present

## 2020-09-16 DIAGNOSIS — J9621 Acute and chronic respiratory failure with hypoxia: Secondary | ICD-10-CM | POA: Diagnosis not present

## 2020-09-16 DIAGNOSIS — U071 COVID-19: Secondary | ICD-10-CM | POA: Diagnosis not present

## 2020-09-16 LAB — TSH: TSH: 22.268 u[IU]/mL — ABNORMAL HIGH (ref 0.350–4.500)

## 2020-09-16 NOTE — Progress Notes (Signed)
Pulmonary Critical Care Medicine [place]   PULMONARY CRITICAL CARE SERVICE  PROGRESS NOTE  Date of Service: 09/16/2020  Marie Chavez  YIR:[id number]  DOB: 1956/09/29   DOA: 09/02/2020  Referring Physician: Merton Border, MD  HPI: Marie Chavez is a 64 y.o. female seen for follow up of Acute on Chronic Respiratory Failure.  She is doing well has been weaning on protocol did about 18 hours of T collar yesterday and now is back on pressure support  Medications: Reviewed on Rounds  Physical Exam:  Vitals: Temperature 96.8 pulse 85 respiratory rate 15 blood pressure is 115/64 saturations 94%  Ventilator Settings on pressure support 12/5 supposed to go to T collar  . General: Comfortable at this time . Eyes: Grossly normal lids, irises & conjunctiva . ENT: grossly tongue is normal . Neck: no obvious mass . Cardiovascular: S1 S2 normal no gallop . Respiratory: No rhonchi very coarse breath sounds . Abdomen: soft . Skin: no rash seen on limited exam . Musculoskeletal: not rigid . Psychiatric:unable to assess . Neurologic: no seizure no involuntary movements         Lab Data:   Basic Metabolic Panel: Recent Labs  Lab 09/12/20 0340 09/14/20 0611  NA 150* 148*  K 3.7 3.4*  CL 104 104  CO2 36* 33*  GLUCOSE 239* 185*  BUN 56* 54*  CREATININE 0.66 0.61  CALCIUM 8.5* 8.6*    ABG: No results for input(s): PHART, PCO2ART, PO2ART, HCO3, O2SAT in the last 168 hours.  Liver Function Tests: No results for input(s): AST, ALT, ALKPHOS, BILITOT, PROT, ALBUMIN in the last 168 hours. No results for input(s): LIPASE, AMYLASE in the last 168 hours. No results for input(s): AMMONIA in the last 168 hours.  CBC: Recent Labs  Lab 09/12/20 0340  WBC 13.3*  HGB 7.7*  HCT 26.2*  MCV 114.4*  PLT 149*    Cardiac Enzymes: No results for input(s): CKTOTAL, CKMB, CKMBINDEX, TROPONINI in the last 168 hours.  BNP (last 3 results) Recent Labs     09/09/20 0623  BNP 137.4*    ProBNP (last 3 results) No results for input(s): PROBNP in the last 8760 hours.  Radiological Exams: No results found.  Assessment/Plan Active Problems:   Acute on chronic respiratory failure with hypoxia ([place])   COVID-19 virus infection   Pneumonia due to COVID-19 virus   History of pulmonary embolism   Severe sepsis ([place])   1. Acute on chronic respiratory failure with hypoxia the patient will go on T collar 24 hours 2. COVID-19 virus infection in recovery 3. Pneumonia due to COVID-19 treated we will continue to follow 4. History of pulmonary embolism treated 5. Severe sepsis resolved   I have personally seen and evaluated the patient, evaluated laboratory and imaging results, formulated the assessment and plan and placed orders. The Patient requires high complexity decision making with multiple systems involvement.  Rounds were done with the Respiratory Therapy Director and Staff therapists and discussed with nursing staff also.  [name], MD [facility] Pulmonary Critical Care Medicine Sleep Medicine

## 2020-09-17 DIAGNOSIS — U071 COVID-19: Secondary | ICD-10-CM | POA: Diagnosis not present

## 2020-09-17 DIAGNOSIS — Z86711 Personal history of pulmonary embolism: Secondary | ICD-10-CM | POA: Diagnosis not present

## 2020-09-17 DIAGNOSIS — A419 Sepsis, unspecified organism: Secondary | ICD-10-CM | POA: Diagnosis not present

## 2020-09-17 DIAGNOSIS — J9621 Acute and chronic respiratory failure with hypoxia: Secondary | ICD-10-CM | POA: Diagnosis not present

## 2020-09-17 LAB — BASIC METABOLIC PANEL
Anion gap: 9 (ref 5–15)
BUN: 42 mg/dL — ABNORMAL HIGH (ref 8–23)
CO2: 31 mmol/L (ref 22–32)
Calcium: 8.7 mg/dL — ABNORMAL LOW (ref 8.9–10.3)
Chloride: 107 mmol/L (ref 98–111)
Creatinine, Ser: 0.56 mg/dL (ref 0.44–1.00)
GFR, Estimated: >60 mL/min (ref >60)
Glucose, Bld: 185 mg/dL — ABNORMAL HIGH (ref 70–99)
Potassium: 3.7 mmol/L (ref 3.5–5.1)
Sodium: 147 mmol/L — ABNORMAL HIGH (ref 135–145)

## 2020-09-17 NOTE — Progress Notes (Signed)
Pulmonary Critical Care Medicine [place]   PULMONARY CRITICAL CARE SERVICE  PROGRESS NOTE  Date of Service: 09/17/2020  Marie Chavez  [id number]:[id number]  DOB: Apr 20, 1956   DOA: 09/02/2020  Referring Physician: Merton Border, MD  HPI: Marie Chavez is a 64 y.o. female seen for follow up of Acute on Chronic Respiratory Failure.  Patient did about 12 hours of T collar yesterday right now is comfortable without distress  Medications: Reviewed on Rounds  Physical Exam:  Vitals: Temperature is 96.5 pulse 89 respiratory rate 19 blood pressure is 148/73 saturations 96%  Ventilator Settings on pressure support FiO2 45% pressure 12/7  . General: Comfortable at this time . Eyes: Grossly normal lids, irises & conjunctiva . ENT: grossly tongue is normal . Neck: no obvious mass . Cardiovascular: S1 S2 normal no gallop . Respiratory: No rhonchi no rales are noted at this time . Abdomen: soft . Skin: no rash seen on limited exam . Musculoskeletal: not rigid . Psychiatric:unable to assess . Neurologic: no seizure no involuntary movements         Lab Data:   Basic Metabolic Panel: Recent Labs  Lab 09/12/20 0340 09/14/20 0611 09/17/20 0440  NA 150* 148* 147*  K 3.7 3.4* 3.7  CL 104 104 107  CO2 36* 33* 31  GLUCOSE 239* 185* 185*  BUN 56* 54* 42*  CREATININE 0.66 0.61 0.56  CALCIUM 8.5* 8.6* 8.7*    ABG: No results for input(s): PHART, PCO2ART, PO2ART, HCO3, O2SAT in the last 168 hours.  Liver Function Tests: No results for input(s): AST, ALT, ALKPHOS, BILITOT, PROT, ALBUMIN in the last 168 hours. No results for input(s): LIPASE, AMYLASE in the last 168 hours. No results for input(s): AMMONIA in the last 168 hours.  CBC: Recent Labs  Lab 09/12/20 0340  WBC 13.3*  HGB 7.7*  HCT 26.2*  MCV 114.4*  PLT 149*    Cardiac Enzymes: No results for input(s): CKTOTAL, CKMB, CKMBINDEX, TROPONINI in the last 168 hours.  BNP (last 3 results) Recent  Labs    09/09/20 0623  BNP 137.4*    ProBNP (last 3 results) No results for input(s): PROBNP in the last 8760 hours.  Radiological Exams: No results found.  Assessment/Plan Active Problems:   Acute on chronic respiratory failure with hypoxia ([place])   COVID-19 virus infection   Pneumonia due to COVID-19 virus   History of pulmonary embolism   Severe sepsis ([place])   1. Acute on chronic respiratory failure with hypoxia we will continue with pressure support patient is on 45% FiO2 will proceed to T collar beyond 12 hours today 2. COVID-19 virus infection in recovery 3. Pneumonia due to COVID-19 treated 4. History of pulmonary embolism at baseline we will continue to follow 5. Severe sepsis resolved   I have personally seen and evaluated the patient, evaluated laboratory and imaging results, formulated the assessment and plan and placed orders. The Patient requires high complexity decision making with multiple systems involvement.  Rounds were done with the Respiratory Therapy Director and Staff therapists and discussed with nursing staff also.  [name], MD [facility] Pulmonary Critical Care Medicine Sleep Medicine

## 2020-09-18 DIAGNOSIS — U071 COVID-19: Secondary | ICD-10-CM | POA: Diagnosis not present

## 2020-09-18 DIAGNOSIS — J9621 Acute and chronic respiratory failure with hypoxia: Secondary | ICD-10-CM | POA: Diagnosis not present

## 2020-09-18 DIAGNOSIS — A419 Sepsis, unspecified organism: Secondary | ICD-10-CM | POA: Diagnosis not present

## 2020-09-18 DIAGNOSIS — Z86711 Personal history of pulmonary embolism: Secondary | ICD-10-CM | POA: Diagnosis not present

## 2020-09-18 LAB — CBC
HCT: 25.9 % — ABNORMAL LOW (ref 36.0–46.0)
Hemoglobin: 7.8 g/dL — ABNORMAL LOW (ref 12.0–15.0)
MCH: 34.1 pg — ABNORMAL HIGH (ref 26.0–34.0)
MCHC: 30.1 g/dL (ref 30.0–36.0)
MCV: 113.1 fL — ABNORMAL HIGH (ref 80.0–100.0)
Platelets: 164 10*3/uL (ref 150–400)
RBC: 2.29 MIL/uL — ABNORMAL LOW (ref 3.87–5.11)
RDW: 23.4 % — ABNORMAL HIGH (ref 11.5–15.5)
WBC: 17.5 10*3/uL — ABNORMAL HIGH (ref 4.0–10.5)
nRBC: 0.2 % (ref 0.0–0.2)

## 2020-09-18 LAB — BASIC METABOLIC PANEL
Anion gap: 7 (ref 5–15)
BUN: 48 mg/dL — ABNORMAL HIGH (ref 8–23)
CO2: 34 mmol/L — ABNORMAL HIGH (ref 22–32)
Calcium: 8.7 mg/dL — ABNORMAL LOW (ref 8.9–10.3)
Chloride: 105 mmol/L (ref 98–111)
Creatinine, Ser: 0.35 mg/dL — ABNORMAL LOW (ref 0.44–1.00)
GFR, Estimated: >60 mL/min (ref >60)
Glucose, Bld: 158 mg/dL — ABNORMAL HIGH (ref 70–99)
Potassium: 3.6 mmol/L (ref 3.5–5.1)
Sodium: 146 mmol/L — ABNORMAL HIGH (ref 135–145)

## 2020-09-18 LAB — BLOOD GAS, ARTERIAL
Acid-Base Excess: 9.3 mmol/L — ABNORMAL HIGH (ref 0.0–2.0)
Bicarbonate: 34.6 mmol/L — ABNORMAL HIGH (ref 20.0–28.0)
FIO2: 50
O2 Saturation: 89.5 %
Patient temperature: 36.2
pCO2 arterial: 58 mmHg — ABNORMAL HIGH (ref 32.0–48.0)
pH, Arterial: 7.389 (ref 7.350–7.450)
pO2, Arterial: 55.3 mmHg — ABNORMAL LOW (ref 83.0–108.0)

## 2020-09-18 NOTE — Progress Notes (Signed)
Pulmonary Critical Care Medicine [place]   PULMONARY CRITICAL CARE SERVICE  PROGRESS NOTE  Date of Service: 09/18/2020  Marie Chavez  [id number]  DOB: [birth year]   DOA: 09/02/2020  Referring Physician: Merton Border, MD  HPI: Marie Chavez is a 64 y.o. female seen for follow up of Acute on Chronic Respiratory Failure.  Patient currently is on T collar has been on 50% FiO2  Medications: Reviewed on Rounds  Physical Exam:  Vitals: Temperature is 96.3 pulse 78 respiratory rate 22 blood pressure is 128/64 saturations 98%  Ventilator Settings on T collar 50% FiO2  . General: Comfortable at this time . Eyes: Grossly normal lids, irises & conjunctiva . ENT: grossly tongue is normal . Neck: no obvious mass . Cardiovascular: S1 S2 normal no gallop . Respiratory: No rhonchi very coarse breath sounds . Abdomen: soft . Skin: no rash seen on limited exam . Musculoskeletal: not rigid . Psychiatric:unable to assess . Neurologic: no seizure no involuntary movements         Lab Data:   Basic Metabolic Panel: Recent Labs  Lab 09/12/20 0340 09/14/20 0611 09/17/20 0440 09/18/20 0335  NA 150* 148* 147* 146*  K 3.7 3.4* 3.7 3.6  CL 104 104 107 105  CO2 36* 33* 31 34*  GLUCOSE 239* 185* 185* 158*  BUN 56* 54* 42* 48*  CREATININE 0.66 0.61 0.56 0.35*  CALCIUM 8.5* 8.6* 8.7* 8.7*    ABG: No results for input(s): PHART, PCO2ART, PO2ART, HCO3, O2SAT in the last 168 hours.  Liver Function Tests: No results for input(s): AST, ALT, ALKPHOS, BILITOT, PROT, ALBUMIN in the last 168 hours. No results for input(s): LIPASE, AMYLASE in the last 168 hours. No results for input(s): AMMONIA in the last 168 hours.  CBC: Recent Labs  Lab 09/12/20 0340 09/18/20 0335  WBC 13.3* 17.5*  HGB 7.7* 7.8*  HCT 26.2* 25.9*  MCV 114.4* 113.1*  PLT 149* 164    Cardiac Enzymes: No results for input(s): CKTOTAL, CKMB, CKMBINDEX, TROPONINI in the last 168  hours.  BNP (last 3 results) Recent Labs    09/09/20 0623  BNP 137.4*    ProBNP (last 3 results) No results for input(s): PROBNP in the last 8760 hours.  Radiological Exams: No results found.  Assessment/Plan Active Problems:   Acute on chronic respiratory failure with hypoxia ([place])   COVID-19 virus infection   Pneumonia due to COVID-19 virus   History of pulmonary embolism   Severe sepsis ([place])   1. Acute on chronic respiratory failure we will continue with T-piece titrate oxygen down as tolerated the goal today is 24 hours to be completed 2. COVID-19 virus infection with Covid 3. Pneumonia due to COVID-19 slow improvement 4. History of pulmonary embolism at baseline 5. Severe sepsis resolved   I have personally seen and evaluated the patient, evaluated laboratory and imaging results, formulated the assessment and plan and placed orders. The Patient requires high complexity decision making with multiple systems involvement.  Rounds were done with the Respiratory Therapy Director and Staff therapists and discussed with nursing staff also.  [name], MD [facility] Pulmonary Critical Care Medicine Sleep Medicine

## 2020-09-19 ENCOUNTER — Other Ambulatory Visit (HOSPITAL_COMMUNITY): Payer: Medicare Other

## 2020-09-19 DIAGNOSIS — A419 Sepsis, unspecified organism: Secondary | ICD-10-CM | POA: Diagnosis not present

## 2020-09-19 DIAGNOSIS — U071 COVID-19: Secondary | ICD-10-CM | POA: Diagnosis not present

## 2020-09-19 DIAGNOSIS — J9621 Acute and chronic respiratory failure with hypoxia: Secondary | ICD-10-CM | POA: Diagnosis not present

## 2020-09-19 DIAGNOSIS — Z86711 Personal history of pulmonary embolism: Secondary | ICD-10-CM | POA: Diagnosis not present

## 2020-09-19 NOTE — Progress Notes (Signed)
Pulmonary Critical Care Medicine [place]   PULMONARY CRITICAL CARE SERVICE  PROGRESS NOTE  Date of Service: 09/19/2020  Marie Chavez  [id number]  DOB: 07-08-1956   DOA: 09/02/2020  Referring Physician: Merton Border, MD  HPI: Marie Chavez is a 63 y.o. female seen for follow up of Acute on Chronic Respiratory Failure.  She is on T collar doing well on 50% FiO2 goal today is 48 hours  Medications: Reviewed on Rounds  Physical Exam:  Vitals: Temperature 96.8 pulse 89 respiratory rate 15 blood pressure is 133/77 saturations 100%  Ventilator Settings off the ventilator on T collar with an FiO2 of 50%  . General: Comfortable at this time . Eyes: Grossly normal lids, irises & conjunctiva . ENT: grossly tongue is normal . Neck: no obvious mass . Cardiovascular: S1 S2 normal no gallop . Respiratory: No rhonchi coarse breath sounds . Abdomen: soft . Skin: no rash seen on limited exam . Musculoskeletal: not rigid . Psychiatric:unable to assess . Neurologic: no seizure no involuntary movements         Lab Data:   Basic Metabolic Panel: Recent Labs  Lab 09/14/20 0611 09/17/20 0440 09/18/20 0335  NA 148* 147* 146*  K 3.4* 3.7 3.6  CL 104 107 105  CO2 33* 31 34*  GLUCOSE 185* 185* 158*  BUN 54* 42* 48*  CREATININE 0.61 0.56 0.35*  CALCIUM 8.6* 8.7* 8.7*    ABG: Recent Labs  Lab 09/18/20 1613  PHART 7.389  PCO2ART 58.0*  PO2ART 55.3*  HCO3 34.6*  O2SAT 89.5    Liver Function Tests: No results for input(s): AST, ALT, ALKPHOS, BILITOT, PROT, ALBUMIN in the last 168 hours. No results for input(s): LIPASE, AMYLASE in the last 168 hours. No results for input(s): AMMONIA in the last 168 hours.  CBC: Recent Labs  Lab 09/18/20 0335  WBC 17.5*  HGB 7.8*  HCT 25.9*  MCV 113.1*  PLT 164    Cardiac Enzymes: No results for input(s): CKTOTAL, CKMB, CKMBINDEX, TROPONINI in the last 168 hours.  BNP (last 3 results) Recent Labs     09/09/20 0623  BNP 137.4*    ProBNP (last 3 results) No results for input(s): PROBNP in the last 8760 hours.  Radiological Exams: DG CHEST PORT 1 VIEW  Result Date: 09/19/2020 CLINICAL DATA:  Pneumonia. EXAM: PORTABLE CHEST 1 VIEW COMPARISON:  Chest x-ray 09/12/2020, 09/08/2020 chest CT 09/08/2020. FINDINGS: Tracheostomy stable position stable cardiomegaly. Diffuse bilateral pulmonary infiltrates/edema again noted similar findings noted on prior exams. No pleural effusion or pneumothorax. Heart size stable. Multiple left rib fractures again noted. IMPRESSION: Tracheostomy tube in stable position. Diffuse bilateral pulmonary infiltrates/edema again noted similar to prior exams. Electronically Signed   By: Marcello Moores  Register   On: 09/19/2020 07:11    Assessment/Plan Active Problems:   Acute on chronic respiratory failure with hypoxia ([place])   COVID-19 virus infection   Pneumonia due to COVID-19 virus   History of pulmonary embolism   Severe sepsis ([place])   1. Acute on chronic respiratory failure hypoxia we will continue with T-piece will be completing 48 hours liberation from the ventilator 2. COVID-19 virus infection urine recovery 3. Pneumonia due to COVID-19 treated slow improvement 4. Pulmonary embolism treated 5. Severe sepsis resolved   I have personally seen and evaluated the patient, evaluated laboratory and imaging results, formulated the assessment and plan and placed orders. The Patient requires high complexity decision making with multiple systems involvement.  Rounds were done with  the Respiratory Therapy Director and Staff therapists and discussed with nursing staff also.  [name], MD [facility] Pulmonary Critical Care Medicine Sleep Medicine

## 2020-09-20 DIAGNOSIS — J9621 Acute and chronic respiratory failure with hypoxia: Secondary | ICD-10-CM | POA: Diagnosis not present

## 2020-09-20 DIAGNOSIS — Z86711 Personal history of pulmonary embolism: Secondary | ICD-10-CM | POA: Diagnosis not present

## 2020-09-20 DIAGNOSIS — U071 COVID-19: Secondary | ICD-10-CM | POA: Diagnosis not present

## 2020-09-20 DIAGNOSIS — A419 Sepsis, unspecified organism: Secondary | ICD-10-CM | POA: Diagnosis not present

## 2020-09-20 LAB — CBC
HCT: 24.9 % — ABNORMAL LOW (ref 36.0–46.0)
Hemoglobin: 7.5 g/dL — ABNORMAL LOW (ref 12.0–15.0)
MCH: 34.6 pg — ABNORMAL HIGH (ref 26.0–34.0)
MCHC: 30.1 g/dL (ref 30.0–36.0)
MCV: 114.7 fL — ABNORMAL HIGH (ref 80.0–100.0)
Platelets: 161 10*3/uL (ref 150–400)
RBC: 2.17 MIL/uL — ABNORMAL LOW (ref 3.87–5.11)
RDW: 23.3 % — ABNORMAL HIGH (ref 11.5–15.5)
WBC: 12.9 10*3/uL — ABNORMAL HIGH (ref 4.0–10.5)
nRBC: 0.2 % (ref 0.0–0.2)

## 2020-09-20 LAB — BLOOD GAS, ARTERIAL
Acid-Base Excess: 8.8 mmol/L — ABNORMAL HIGH (ref 0.0–2.0)
Bicarbonate: 33.8 mmol/L — ABNORMAL HIGH (ref 20.0–28.0)
FIO2: 40
O2 Saturation: 97.1 %
Patient temperature: 37
pCO2 arterial: 55.8 mmHg — ABNORMAL HIGH (ref 32.0–48.0)
pH, Arterial: 7.399 (ref 7.350–7.450)
pO2, Arterial: 81.7 mmHg — ABNORMAL LOW (ref 83.0–108.0)

## 2020-09-20 LAB — BASIC METABOLIC PANEL
Anion gap: 11 (ref 5–15)
BUN: 41 mg/dL — ABNORMAL HIGH (ref 8–23)
CO2: 31 mmol/L (ref 22–32)
Calcium: 8 mg/dL — ABNORMAL LOW (ref 8.9–10.3)
Chloride: 105 mmol/L (ref 98–111)
Creatinine, Ser: 0.57 mg/dL (ref 0.44–1.00)
GFR, Estimated: >60 mL/min (ref >60)
Glucose, Bld: 182 mg/dL — ABNORMAL HIGH (ref 70–99)
Potassium: 4.5 mmol/L (ref 3.5–5.1)
Sodium: 147 mmol/L — ABNORMAL HIGH (ref 135–145)

## 2020-09-20 NOTE — Progress Notes (Signed)
Pulmonary Critical Care Medicine [place]   PULMONARY CRITICAL CARE SERVICE  PROGRESS NOTE  Date of Service: 09/20/2020  Marie Chavez  KGM:[id number]  DOB: 1956-08-01   DOA: 09/02/2020  Referring Physician: Merton Border, MD  HPI: Marie Chavez is a 64 y.o. female seen for follow up of Acute on Chronic Respiratory Failure.  Patient is back on the ventilator apparently had mental status changes with respiratory therapist placed back on the ventilator.  She now is on assist control mode  Medications: Reviewed on Rounds  Physical Exam:  Vitals: Temperature 95.9 pulse 73 respiratory rate 16 blood pressure is 112/68 saturations 95%  Ventilator Settings on assist control FiO2 is 40% tidal volume 440 PEEP 7  . General: Comfortable at this time . Eyes: Grossly normal lids, irises & conjunctiva . ENT: grossly tongue is normal . Neck: no obvious mass . Cardiovascular: S1 S2 normal no gallop . Respiratory: No rhonchi very coarse breath sounds . Abdomen: soft . Skin: no rash seen on limited exam . Musculoskeletal: not rigid . Psychiatric:unable to assess . Neurologic: no seizure no involuntary movements         Lab Data:   Basic Metabolic Panel: Recent Labs  Lab 09/14/20 0611 09/17/20 0440 09/18/20 0335 09/20/20 0503  NA 148* 147* 146* 147*  K 3.4* 3.7 3.6 4.5  CL 104 107 105 105  CO2 33* 31 34* 31  GLUCOSE 185* 185* 158* 182*  BUN 54* 42* 48* 41*  CREATININE 0.61 0.56 0.35* 0.57  CALCIUM 8.6* 8.7* 8.7* 8.0*    ABG: Recent Labs  Lab 09/18/20 1613  PHART 7.389  PCO2ART 58.0*  PO2ART 55.3*  HCO3 34.6*  O2SAT 89.5    Liver Function Tests: No results for input(s): AST, ALT, ALKPHOS, BILITOT, PROT, ALBUMIN in the last 168 hours. No results for input(s): LIPASE, AMYLASE in the last 168 hours. No results for input(s): AMMONIA in the last 168 hours.  CBC: Recent Labs  Lab 09/18/20 0335 09/20/20 0503  WBC 17.5* 12.9*  HGB 7.8* 7.5*   HCT 25.9* 24.9*  MCV 113.1* 114.7*  PLT 164 161    Cardiac Enzymes: No results for input(s): CKTOTAL, CKMB, CKMBINDEX, TROPONINI in the last 168 hours.  BNP (last 3 results) Recent Labs    09/09/20 0623  BNP 137.4*    ProBNP (last 3 results) No results for input(s): PROBNP in the last 8760 hours.  Radiological Exams: DG CHEST PORT 1 VIEW  Result Date: 09/19/2020 CLINICAL DATA:  Pneumonia. EXAM: PORTABLE CHEST 1 VIEW COMPARISON:  Chest x-ray 09/12/2020, 09/08/2020 chest CT 09/08/2020. FINDINGS: Tracheostomy stable position stable cardiomegaly. Diffuse bilateral pulmonary infiltrates/edema again noted similar findings noted on prior exams. No pleural effusion or pneumothorax. Heart size stable. Multiple left rib fractures again noted. IMPRESSION: Tracheostomy tube in stable position. Diffuse bilateral pulmonary infiltrates/edema again noted similar to prior exams. Electronically Signed   By: Marcello Moores  Register   On: 09/19/2020 07:11    Assessment/Plan Active Problems:   Acute on chronic respiratory failure with hypoxia ([place])   COVID-19 virus infection   Pneumonia due to COVID-19 virus   History of pulmonary embolism   Severe sepsis ([place])   1. Acute on chronic respiratory failure with hypoxia we will continue with full support on the ventilator for now.  I have instructed respiratory therapy to check follow-up ABG 2. COVID-19 virus infection in recovery 3. Pneumonia due to COVID-19 treated 4. History of pulmonary embolism treated we will continue to follow  5. Severe sepsis resolved hemodynamics are stable   I have personally seen and evaluated the patient, evaluated laboratory and imaging results, formulated the assessment and plan and placed orders. The Patient requires high complexity decision making with multiple systems involvement.  Rounds were done with the Respiratory Therapy Director and Staff therapists and discussed with nursing staff also.  [name], MD  [facility] Pulmonary Critical Care Medicine Sleep Medicine

## 2020-09-22 DIAGNOSIS — J9621 Acute and chronic respiratory failure with hypoxia: Secondary | ICD-10-CM | POA: Diagnosis not present

## 2020-09-22 DIAGNOSIS — A419 Sepsis, unspecified organism: Secondary | ICD-10-CM | POA: Diagnosis not present

## 2020-09-22 DIAGNOSIS — U071 COVID-19: Secondary | ICD-10-CM | POA: Diagnosis not present

## 2020-09-22 DIAGNOSIS — Z86711 Personal history of pulmonary embolism: Secondary | ICD-10-CM | POA: Diagnosis not present

## 2020-09-22 NOTE — Progress Notes (Signed)
Pulmonary Critical Care Medicine [place]   PULMONARY CRITICAL CARE SERVICE  PROGRESS NOTE  Date of Service: 09/22/2020  Marie Chavez  [id number]:[id number]  DOB: 1956/06/17   DOA: 09/02/2020  Referring Physician: Merton Border, MD  HPI: Marie Chavez is a 64 y.o. female seen for follow up of Acute on Chronic Respiratory Failure.  Patient is on full support again on the ventilator assist control had to be placed back on over the weekend respiratory therapy is going to reassess weaning today and try to get her back off the ventilator.  Medications: Reviewed on Rounds  Physical Exam:  Vitals: Temperature is 96.3 pulse 86 respiratory rate 22 blood pressure is 147/71 saturations 98%  Ventilator Settings on assist control FiO2 is 45% tidal volume 440 PEEP of 7  . General: Comfortable at this time . Eyes: Grossly normal lids, irises & conjunctiva . ENT: grossly tongue is normal . Neck: no obvious mass . Cardiovascular: S1 S2 normal no gallop . Respiratory: Scattered rhonchi expansion is equal . Abdomen: soft . Skin: no rash seen on limited exam . Musculoskeletal: not rigid . Psychiatric:unable to assess . Neurologic: no seizure no involuntary movements         Lab Data:   Basic Metabolic Panel: Recent Labs  Lab 09/17/20 0440 09/18/20 0335 09/20/20 0503  NA 147* 146* 147*  K 3.7 3.6 4.5  CL 107 105 105  CO2 31 34* 31  GLUCOSE 185* 158* 182*  BUN 42* 48* 41*  CREATININE 0.56 0.35* 0.57  CALCIUM 8.7* 8.7* 8.0*    ABG: Recent Labs  Lab 09/18/20 1613 09/20/20 0930  PHART 7.389 7.399  PCO2ART 58.0* 55.8*  PO2ART 55.3* 81.7*  HCO3 34.6* 33.8*  O2SAT 89.5 97.1    Liver Function Tests: No results for input(s): AST, ALT, ALKPHOS, BILITOT, PROT, ALBUMIN in the last 168 hours. No results for input(s): LIPASE, AMYLASE in the last 168 hours. No results for input(s): AMMONIA in the last 168 hours.  CBC: Recent Labs  Lab 09/18/20 0335  09/20/20 0503  WBC 17.5* 12.9*  HGB 7.8* 7.5*  HCT 25.9* 24.9*  MCV 113.1* 114.7*  PLT 164 161    Cardiac Enzymes: No results for input(s): CKTOTAL, CKMB, CKMBINDEX, TROPONINI in the last 168 hours.  BNP (last 3 results) Recent Labs    09/09/20 0623  BNP 137.4*    ProBNP (last 3 results) No results for input(s): PROBNP in the last 8760 hours.  Radiological Exams: No results found.  Assessment/Plan Active Problems:   Acute on chronic respiratory failure with hypoxia ([place])   COVID-19 virus infection   Pneumonia due to COVID-19 virus   History of pulmonary embolism   Severe sepsis ([place])   1. Acute on chronic respiratory failure with hypoxia patient right now is on full support spoke with respiratory therapy during rounds to begin with the weaning again 2. COVID-19 virus infection in recovery 3. Pneumonia due to COVID-19 treated improved 4. History of pulmonary embolism no change 5. Severe sepsis resolved   I have personally seen and evaluated the patient, evaluated laboratory and imaging results, formulated the assessment and plan and placed orders. The Patient requires high complexity decision making with multiple systems involvement.  Rounds were done with the Respiratory Therapy Director and Staff therapists and discussed with nursing staff also.  [name], MD [facility] Pulmonary Critical Care Medicine Sleep Medicine

## 2020-09-23 DIAGNOSIS — A419 Sepsis, unspecified organism: Secondary | ICD-10-CM | POA: Diagnosis not present

## 2020-09-23 DIAGNOSIS — Z86711 Personal history of pulmonary embolism: Secondary | ICD-10-CM | POA: Diagnosis not present

## 2020-09-23 DIAGNOSIS — U071 COVID-19: Secondary | ICD-10-CM | POA: Diagnosis not present

## 2020-09-23 DIAGNOSIS — J9621 Acute and chronic respiratory failure with hypoxia: Secondary | ICD-10-CM | POA: Diagnosis not present

## 2020-09-23 LAB — CBC
HCT: 27 % — ABNORMAL LOW (ref 36.0–46.0)
Hemoglobin: 8.2 g/dL — ABNORMAL LOW (ref 12.0–15.0)
MCH: 35.2 pg — ABNORMAL HIGH (ref 26.0–34.0)
MCHC: 30.4 g/dL (ref 30.0–36.0)
MCV: 115.9 fL — ABNORMAL HIGH (ref 80.0–100.0)
Platelets: 140 10*3/uL — ABNORMAL LOW (ref 150–400)
RBC: 2.33 MIL/uL — ABNORMAL LOW (ref 3.87–5.11)
RDW: 21.5 % — ABNORMAL HIGH (ref 11.5–15.5)
WBC: 8.7 10*3/uL (ref 4.0–10.5)
nRBC: 0.5 % — ABNORMAL HIGH (ref 0.0–0.2)

## 2020-09-23 LAB — BASIC METABOLIC PANEL
Anion gap: 8 (ref 5–15)
BUN: 48 mg/dL — ABNORMAL HIGH (ref 8–23)
CO2: 31 mmol/L (ref 22–32)
Calcium: 8.8 mg/dL — ABNORMAL LOW (ref 8.9–10.3)
Chloride: 102 mmol/L (ref 98–111)
Creatinine, Ser: 0.49 mg/dL (ref 0.44–1.00)
GFR, Estimated: >60 mL/min (ref >60)
Glucose, Bld: 207 mg/dL — ABNORMAL HIGH (ref 70–99)
Potassium: 4.7 mmol/L (ref 3.5–5.1)
Sodium: 141 mmol/L (ref 135–145)

## 2020-09-23 NOTE — Progress Notes (Signed)
Pulmonary Critical Care Medicine [place]   PULMONARY CRITICAL CARE SERVICE  PROGRESS NOTE  Date of Service: 09/23/2020  Marie Chavez  [id number]:[id number]  DOB: [birth year]   DOA: 09/02/2020  Referring Physician: Merton Border, MD  HPI: Marie Chavez is a 64 y.o. female seen for follow up of Acute on Chronic Respiratory Failure.  Patient currently is on full support on assist control mode has been on 45% FiO2 good volumes are noted at this time  Medications: Reviewed on Rounds  Physical Exam:  Vitals: Temperature is 96.8 pulse 78 respiratory rate is 16 blood pressures 138/84 saturations 100%  Ventilator Settings patient is on assist control FiO2 of 45% PEEP 7 tidal volume is 440  . General: Comfortable at this time . Eyes: Grossly normal lids, irises & conjunctiva . ENT: grossly tongue is normal . Neck: no obvious mass . Cardiovascular: S1 S2 normal no gallop . Respiratory: No rhonchi very coarse breath sounds . Abdomen: soft . Skin: no rash seen on limited exam . Musculoskeletal: not rigid . Psychiatric:unable to assess . Neurologic: no seizure no involuntary movements         Lab Data:   Basic Metabolic Panel: Recent Labs  Lab 09/17/20 0440 09/18/20 0335 09/20/20 0503 09/23/20 0524  NA 147* 146* 147* 141  K 3.7 3.6 4.5 4.7  CL 107 105 105 102  CO2 31 34* 31 31  GLUCOSE 185* 158* 182* 207*  BUN 42* 48* 41* 48*  CREATININE 0.56 0.35* 0.57 0.49  CALCIUM 8.7* 8.7* 8.0* 8.8*    ABG: Recent Labs  Lab 09/18/20 1613 09/20/20 0930  PHART 7.389 7.399  PCO2ART 58.0* 55.8*  PO2ART 55.3* 81.7*  HCO3 34.6* 33.8*  O2SAT 89.5 97.1    Liver Function Tests: No results for input(s): AST, ALT, ALKPHOS, BILITOT, PROT, ALBUMIN in the last 168 hours. No results for input(s): LIPASE, AMYLASE in the last 168 hours. No results for input(s): AMMONIA in the last 168 hours.  CBC: Recent Labs  Lab 09/18/20 0335 09/20/20 0503 09/23/20 0524  WBC  17.5* 12.9* 8.7  HGB 7.8* 7.5* 8.2*  HCT 25.9* 24.9* 27.0*  MCV 113.1* 114.7* 115.9*  PLT 164 161 140*    Cardiac Enzymes: No results for input(s): CKTOTAL, CKMB, CKMBINDEX, TROPONINI in the last 168 hours.  BNP (last 3 results) Recent Labs    09/09/20 0623  BNP 137.4*    ProBNP (last 3 results) No results for input(s): PROBNP in the last 8760 hours.  Radiological Exams: No results found.  Assessment/Plan Active Problems:   Acute on chronic respiratory failure with hypoxia ([place])   COVID-19 virus infection   Pneumonia due to COVID-19 virus   History of pulmonary embolism   Severe sepsis ([place])   1. Acute on chronic respiratory failure with hypoxia we will continue with full support on assist control patient is on 45% FiO2 we will continue with secretion management supportive care. 2. COVID-19 virus infection in recovery 3. Pneumonia due to COVID-19 treated slow improvement 4. Pulmonary embolism treated 5. Severe sepsis this is resolved. 6. Worsening pulmonary infiltrates patient previously had responded well to diuresis will discuss with the primary care team.   I have personally seen and evaluated the patient, evaluated laboratory and imaging results, formulated the assessment and plan and placed orders. The Patient requires high complexity decision making with multiple systems involvement.  Rounds were done with the Respiratory Therapy Director and Staff therapists and discussed with nursing staff also.  Marie Chavez  Richardson Dopp, MD [facility] Pulmonary Critical Care Medicine Sleep Medicine

## 2020-09-24 DIAGNOSIS — U071 COVID-19: Secondary | ICD-10-CM | POA: Diagnosis not present

## 2020-09-24 DIAGNOSIS — A419 Sepsis, unspecified organism: Secondary | ICD-10-CM | POA: Diagnosis not present

## 2020-09-24 DIAGNOSIS — Z86711 Personal history of pulmonary embolism: Secondary | ICD-10-CM | POA: Diagnosis not present

## 2020-09-24 DIAGNOSIS — J9621 Acute and chronic respiratory failure with hypoxia: Secondary | ICD-10-CM | POA: Diagnosis not present

## 2020-09-24 NOTE — Progress Notes (Signed)
Pulmonary Critical Care Medicine [place]   PULMONARY CRITICAL CARE SERVICE  PROGRESS NOTE  Date of Service: 09/24/2020  Marie Chavez  [id number]  DOB: 16-May-1956   DOA: 09/02/2020  Referring Physician: Merton Border, MD  HPI: Marie Chavez is a 64 y.o. female seen for follow up of Acute on Chronic Respiratory Failure.  Patient currently is on pressure support is done fairly well the goal today will be to advance on pressure support as tolerated  Medications: Reviewed on Rounds  Physical Exam:  Vitals: Temperature 96.5 pulse 85 respiratory rate 20 blood pressure 120/74 saturations 99%  Ventilator Settings on pressure support FiO2 40% pressure 12/7  . General: Comfortable at this time . Eyes: Grossly normal lids, irises & conjunctiva . ENT: grossly tongue is normal . Neck: no obvious mass . Cardiovascular: S1 S2 normal no gallop . Respiratory: No rhonchi coarse breath sounds . Abdomen: soft . Skin: no rash seen on limited exam . Musculoskeletal: not rigid . Psychiatric:unable to assess . Neurologic: no seizure no involuntary movements         Lab Data:   Basic Metabolic Panel: Recent Labs  Lab 09/18/20 0335 09/20/20 0503 09/23/20 0524  NA 146* 147* 141  K 3.6 4.5 4.7  CL 105 105 102  CO2 34* 31 31  GLUCOSE 158* 182* 207*  BUN 48* 41* 48*  CREATININE 0.35* 0.57 0.49  CALCIUM 8.7* 8.0* 8.8*    ABG: Recent Labs  Lab 09/18/20 1613 09/20/20 0930  PHART 7.389 7.399  PCO2ART 58.0* 55.8*  PO2ART 55.3* 81.7*  HCO3 34.6* 33.8*  O2SAT 89.5 97.1    Liver Function Tests: No results for input(s): AST, ALT, ALKPHOS, BILITOT, PROT, ALBUMIN in the last 168 hours. No results for input(s): LIPASE, AMYLASE in the last 168 hours. No results for input(s): AMMONIA in the last 168 hours.  CBC: Recent Labs  Lab 09/18/20 0335 09/20/20 0503 09/23/20 0524  WBC 17.5* 12.9* 8.7  HGB 7.8* 7.5* 8.2*  HCT 25.9* 24.9* 27.0*  MCV 113.1* 114.7*  115.9*  PLT 164 161 140*    Cardiac Enzymes: No results for input(s): CKTOTAL, CKMB, CKMBINDEX, TROPONINI in the last 168 hours.  BNP (last 3 results) Recent Labs    09/09/20 0623  BNP 137.4*    ProBNP (last 3 results) No results for input(s): PROBNP in the last 8760 hours.  Radiological Exams: No results found.  Assessment/Plan Active Problems:   Acute on chronic respiratory failure with hypoxia ([place])   COVID-19 virus infection   Pneumonia due to COVID-19 virus   History of pulmonary embolism   Severe sepsis ([place])   1. Acute on chronic respiratory failure with hypoxia we will continue to advance to wean today pressure support should go for 12 hours if she is able to tolerate 2. COVID-19 virus infection in recovery continue to follow 3. Pneumonia due to COVID-19 treated 4. Pulmonary embolism treated slowly improving 5. Severe sepsis resolved we will continue with present management   I have personally seen and evaluated the patient, evaluated laboratory and imaging results, formulated the assessment and plan and placed orders. The Patient requires high complexity decision making with multiple systems involvement.  Rounds were done with the Respiratory Therapy Director and Staff therapists and discussed with nursing staff also.  [name], MD [facility] Pulmonary Critical Care Medicine Sleep Medicine

## 2020-09-25 DIAGNOSIS — A419 Sepsis, unspecified organism: Secondary | ICD-10-CM | POA: Diagnosis not present

## 2020-09-25 DIAGNOSIS — J9621 Acute and chronic respiratory failure with hypoxia: Secondary | ICD-10-CM | POA: Diagnosis not present

## 2020-09-25 DIAGNOSIS — U071 COVID-19: Secondary | ICD-10-CM | POA: Diagnosis not present

## 2020-09-25 DIAGNOSIS — Z86711 Personal history of pulmonary embolism: Secondary | ICD-10-CM | POA: Diagnosis not present

## 2020-09-25 LAB — BLOOD GAS, ARTERIAL
Acid-Base Excess: 5.8 mmol/L — ABNORMAL HIGH (ref 0.0–2.0)
Bicarbonate: 30.4 mmol/L — ABNORMAL HIGH (ref 20.0–28.0)
FIO2: 35
O2 Saturation: 98.4 %
Patient temperature: 37
pCO2 arterial: 49.4 mmHg — ABNORMAL HIGH (ref 32.0–48.0)
pH, Arterial: 7.406 (ref 7.350–7.450)
pO2, Arterial: 100 mmHg (ref 83.0–108.0)

## 2020-09-25 LAB — TSH: TSH: 17.509 u[IU]/mL — ABNORMAL HIGH (ref 0.350–4.500)

## 2020-09-25 NOTE — Progress Notes (Signed)
Pulmonary Critical Care Medicine [place]   PULMONARY CRITICAL CARE SERVICE  PROGRESS NOTE  Date of Service: 09/25/2020  Marie Chavez  [id number]:[id number]  DOB: 1956-01-04   DOA: 09/02/2020  Referring Physician: Merton Border, MD  HPI: Marie Chavez is a 64 y.o. female seen for follow up of Acute on Chronic Respiratory Failure.  Patient currently is on pressure support has been doing pressure port now for 24 hours ready for T collar  Medications: Reviewed on Rounds  Physical Exam:  Vitals: Temperature 96.9 pulse 83 respiratory rate is 27 blood pressure 159 saturations 100%  Ventilator Settings on pressure support FiO2 35% pressure 12/5  . General: Comfortable at this time . Eyes: Grossly normal lids, irises & conjunctiva . ENT: grossly tongue is normal . Neck: no obvious mass . Cardiovascular: S1 S2 normal no gallop . Respiratory: No rhonchi no rales noted at this time . Abdomen: soft . Skin: no rash seen on limited exam . Musculoskeletal: not rigid . Psychiatric:unable to assess . Neurologic: no seizure no involuntary movements         Lab Data:   Basic Metabolic Panel: Recent Labs  Lab 09/20/20 0503 09/23/20 0524  NA 147* 141  K 4.5 4.7  CL 105 102  CO2 31 31  GLUCOSE 182* 207*  BUN 41* 48*  CREATININE 0.57 0.49  CALCIUM 8.0* 8.8*    ABG: Recent Labs  Lab 09/18/20 1613 09/20/20 0930  PHART 7.389 7.399  PCO2ART 58.0* 55.8*  PO2ART 55.3* 81.7*  HCO3 34.6* 33.8*  O2SAT 89.5 97.1    Liver Function Tests: No results for input(s): AST, ALT, ALKPHOS, BILITOT, PROT, ALBUMIN in the last 168 hours. No results for input(s): LIPASE, AMYLASE in the last 168 hours. No results for input(s): AMMONIA in the last 168 hours.  CBC: Recent Labs  Lab 09/20/20 0503 09/23/20 0524  WBC 12.9* 8.7  HGB 7.5* 8.2*  HCT 24.9* 27.0*  MCV 114.7* 115.9*  PLT 161 140*    Cardiac Enzymes: No results for input(s): CKTOTAL, CKMB, CKMBINDEX, TROPONINI  in the last 168 hours.  BNP (last 3 results) Recent Labs    09/09/20 0623  BNP 137.4*    ProBNP (last 3 results) No results for input(s): PROBNP in the last 8760 hours.  Radiological Exams: No results found.  Assessment/Plan Active Problems:   Acute on chronic respiratory failure with hypoxia ([place])   COVID-19 virus infection   Pneumonia due to COVID-19 virus   History of pulmonary embolism   Severe sepsis ([place])   1. Acute on chronic respiratory failure hypoxia we will continue with weaning on pressure support.  Continue with pulmonary toilet supportive care. 2. COVID-19 virus infection recovery 3. Pneumonia due to COVID-19 treated 4. Pulmonary embolism by history supportive care 5. Severe sepsis resolved   I have personally seen and evaluated the patient, evaluated laboratory and imaging results, formulated the assessment and plan and placed orders. The Patient requires high complexity decision making with multiple systems involvement.  Rounds were done with the Respiratory Therapy Director and Staff therapists and discussed with nursing staff also.  [name], MD [facility] Pulmonary Critical Care Medicine Sleep Medicine

## 2020-09-26 ENCOUNTER — Other Ambulatory Visit (HOSPITAL_COMMUNITY): Payer: Medicare Other

## 2020-09-26 DIAGNOSIS — U071 COVID-19: Secondary | ICD-10-CM | POA: Diagnosis not present

## 2020-09-26 DIAGNOSIS — Z86711 Personal history of pulmonary embolism: Secondary | ICD-10-CM | POA: Diagnosis not present

## 2020-09-26 DIAGNOSIS — J9621 Acute and chronic respiratory failure with hypoxia: Secondary | ICD-10-CM | POA: Diagnosis not present

## 2020-09-26 DIAGNOSIS — A419 Sepsis, unspecified organism: Secondary | ICD-10-CM | POA: Diagnosis not present

## 2020-09-26 LAB — BASIC METABOLIC PANEL
Anion gap: 10 (ref 5–15)
BUN: 48 mg/dL — ABNORMAL HIGH (ref 8–23)
CO2: 30 mmol/L (ref 22–32)
Calcium: 9.3 mg/dL (ref 8.9–10.3)
Chloride: 107 mmol/L (ref 98–111)
Creatinine, Ser: 0.42 mg/dL — ABNORMAL LOW (ref 0.44–1.00)
GFR, Estimated: >60 mL/min (ref >60)
Glucose, Bld: 190 mg/dL — ABNORMAL HIGH (ref 70–99)
Potassium: 4.1 mmol/L (ref 3.5–5.1)
Sodium: 147 mmol/L — ABNORMAL HIGH (ref 135–145)

## 2020-09-26 LAB — BLOOD GAS, ARTERIAL
Acid-Base Excess: 5.3 mmol/L — ABNORMAL HIGH (ref 0.0–2.0)
Bicarbonate: 31.4 mmol/L — ABNORMAL HIGH (ref 20.0–28.0)
FIO2: 60
O2 Saturation: 89.5 %
Patient temperature: 36.4
pCO2 arterial: 64.2 mmHg — ABNORMAL HIGH (ref 32.0–48.0)
pH, Arterial: 7.306 — ABNORMAL LOW (ref 7.350–7.450)
pO2, Arterial: 62.1 mmHg — ABNORMAL LOW (ref 83.0–108.0)

## 2020-09-26 LAB — CBC
HCT: 24.4 % — ABNORMAL LOW (ref 36.0–46.0)
Hemoglobin: 7.4 g/dL — ABNORMAL LOW (ref 12.0–15.0)
MCH: 34.4 pg — ABNORMAL HIGH (ref 26.0–34.0)
MCHC: 30.3 g/dL (ref 30.0–36.0)
MCV: 113.5 fL — ABNORMAL HIGH (ref 80.0–100.0)
Platelets: 172 10*3/uL (ref 150–400)
RBC: 2.15 MIL/uL — ABNORMAL LOW (ref 3.87–5.11)
RDW: 20.2 % — ABNORMAL HIGH (ref 11.5–15.5)
WBC: 9.6 10*3/uL (ref 4.0–10.5)
nRBC: 0.3 % — ABNORMAL HIGH (ref 0.0–0.2)

## 2020-09-26 NOTE — Progress Notes (Signed)
Pulmonary Critical Care Medicine [place]   PULMONARY CRITICAL CARE SERVICE  PROGRESS NOTE  Date of Service: 09/26/2020  Marie Chavez  VZD:[id number]  DOB: Jun 02, 1956   DOA: 09/02/2020  Referring Physician: Merton Border, MD  HPI: Marie Chavez is a 64 y.o. female seen for follow up of Acute on Chronic Respiratory Failure. Patient currently is on pressure support yesterday did 8 hours of T collar  Medications: Reviewed on Rounds  Physical Exam:  Vitals: Temperature is 97.3 pulse 87 respiratory rate 14 blood pressure is 113/55 saturations 91%  Ventilator Settings on pressure support FiO2 45% pressure 12/5  . General: Comfortable at this time . Eyes: Grossly normal lids, irises & conjunctiva . ENT: grossly tongue is normal . Neck: no obvious mass . Cardiovascular: S1 S2 normal no gallop . Respiratory: No rhonchi very coarse breath sounds . Abdomen: soft . Skin: no rash seen on limited exam . Musculoskeletal: not rigid . Psychiatric:unable to assess . Neurologic: no seizure no involuntary movements         Lab Data:   Basic Metabolic Panel: Recent Labs  Lab 09/20/20 0503 09/23/20 0524  NA 147* 141  K 4.5 4.7  CL 105 102  CO2 31 31  GLUCOSE 182* 207*  BUN 41* 48*  CREATININE 0.57 0.49  CALCIUM 8.0* 8.8*    ABG: Recent Labs  Lab 09/20/20 0930 09/25/20 0925  PHART 7.399 7.406  PCO2ART 55.8* 49.4*  PO2ART 81.7* 100  HCO3 33.8* 30.4*  O2SAT 97.1 98.4    Liver Function Tests: No results for input(s): AST, ALT, ALKPHOS, BILITOT, PROT, ALBUMIN in the last 168 hours. No results for input(s): LIPASE, AMYLASE in the last 168 hours. No results for input(s): AMMONIA in the last 168 hours.  CBC: Recent Labs  Lab 09/20/20 0503 09/23/20 0524  WBC 12.9* 8.7  HGB 7.5* 8.2*  HCT 24.9* 27.0*  MCV 114.7* 115.9*  PLT 161 140*    Cardiac Enzymes: No results for input(s): CKTOTAL, CKMB, CKMBINDEX, TROPONINI in the last 168 hours.  BNP  (last 3 results) Recent Labs    09/09/20 0623  BNP 137.4*    ProBNP (last 3 results) No results for input(s): PROBNP in the last 8760 hours.  Radiological Exams: No results found.  Assessment/Plan Active Problems:   Acute on chronic respiratory failure with hypoxia ([place])   COVID-19 virus infection   Pneumonia due to COVID-19 virus   History of pulmonary embolism   Severe sepsis ([place])   1. Acute on chronic respiratory failure hypoxia we will continue with the wean protocol patient should be able to continue with T collar today as tolerated. 2. COVID-19 virus infection in recovery 3. Pneumonia due to COVID-19 treated improved 4. Pulmonary embolism treated 5. Severe sepsis resolved   I have personally seen and evaluated the patient, evaluated laboratory and imaging results, formulated the assessment and plan and placed orders. The Patient requires high complexity decision making with multiple systems involvement.  Rounds were done with the Respiratory Therapy Director and Staff therapists and discussed with nursing staff also.  [name], MD [facility] Pulmonary Critical Care Medicine Sleep Medicine

## 2020-09-27 ENCOUNTER — Other Ambulatory Visit: Payer: Self-pay

## 2020-09-27 DIAGNOSIS — Z86711 Personal history of pulmonary embolism: Secondary | ICD-10-CM | POA: Diagnosis not present

## 2020-09-27 DIAGNOSIS — U071 COVID-19: Secondary | ICD-10-CM | POA: Diagnosis not present

## 2020-09-27 DIAGNOSIS — A419 Sepsis, unspecified organism: Secondary | ICD-10-CM | POA: Diagnosis not present

## 2020-09-27 DIAGNOSIS — J9621 Acute and chronic respiratory failure with hypoxia: Secondary | ICD-10-CM | POA: Diagnosis not present

## 2020-09-27 LAB — BLOOD CULTURE ID PANEL (REFLEXED) - BCID2

## 2020-09-27 LAB — CBC
HCT: 25.8 % — ABNORMAL LOW (ref 36.0–46.0)
Hemoglobin: 7.6 g/dL — ABNORMAL LOW (ref 12.0–15.0)
MCH: 33.5 pg (ref 26.0–34.0)
MCHC: 29.5 g/dL — ABNORMAL LOW (ref 30.0–36.0)
MCV: 113.7 fL — ABNORMAL HIGH (ref 80.0–100.0)
Platelets: 161 10*3/uL (ref 150–400)
RBC: 2.27 MIL/uL — ABNORMAL LOW (ref 3.87–5.11)
RDW: 19.8 % — ABNORMAL HIGH (ref 11.5–15.5)
WBC: 8.5 10*3/uL (ref 4.0–10.5)
nRBC: 0.5 % — ABNORMAL HIGH (ref 0.0–0.2)

## 2020-09-27 LAB — BASIC METABOLIC PANEL
Anion gap: 10 (ref 5–15)
BUN: 52 mg/dL — ABNORMAL HIGH (ref 8–23)
CO2: 31 mmol/L (ref 22–32)
Calcium: 9.2 mg/dL (ref 8.9–10.3)
Chloride: 107 mmol/L (ref 98–111)
Creatinine, Ser: 0.37 mg/dL — ABNORMAL LOW (ref 0.44–1.00)
GFR, Estimated: >60 mL/min (ref >60)
Glucose, Bld: 268 mg/dL — ABNORMAL HIGH (ref 70–99)
Potassium: 4.1 mmol/L (ref 3.5–5.1)
Sodium: 148 mmol/L — ABNORMAL HIGH (ref 135–145)

## 2020-09-27 LAB — BLOOD GAS, ARTERIAL
Acid-Base Excess: 6.1 mmol/L — ABNORMAL HIGH (ref 0.0–2.0)
Bicarbonate: 32.1 mmol/L — ABNORMAL HIGH (ref 20.0–28.0)
FIO2: 75
O2 Saturation: 91.3 %
Patient temperature: 36.1
pCO2 arterial: 63.5 mmHg — ABNORMAL HIGH (ref 32.0–48.0)
pH, Arterial: 7.319 — ABNORMAL LOW (ref 7.350–7.450)
pO2, Arterial: 62.6 mmHg — ABNORMAL LOW (ref 83.0–108.0)

## 2020-09-27 LAB — MAGNESIUM: Magnesium: 2.2 mg/dL (ref 1.7–2.4)

## 2020-09-27 NOTE — Progress Notes (Addendum)
Pulmonary Critical Care Medicine [place]   PULMONARY CRITICAL CARE SERVICE  PROGRESS NOTE  Date of Service: 09/27/2020  POETRY CERRO  WNI:[id number]  DOB: 10/12/1956   DOA: 09/02/2020  Referring Physician: Merton Border, MD  HPI: Marie Chavez is a 64 y.o. female seen for follow up of Acute on Chronic Respiratory Failure.  Patient currently is on assist control mode has been on 55% FiO2 good saturations are noted  Medications: Reviewed on Rounds  Physical Exam:  Vitals: Temperature is 96.7 pulse 77 respiratory 21 blood pressure is 111/62 saturations 98%  Ventilator Settings on assist control FiO2 is 55% tidal volume 440 PEEP 7  . General: Comfortable at this time . Eyes: Grossly normal lids, irises & conjunctiva . ENT: grossly tongue is normal . Neck: no obvious mass . Cardiovascular: S1 S2 normal no gallop . Respiratory: No rhonchi very coarse breath sounds . Abdomen: soft . Skin: no rash seen on limited exam . Musculoskeletal: not rigid . Psychiatric:unable to assess . Neurologic: no seizure no involuntary movements         Lab Data:   Basic Metabolic Panel: Recent Labs  Lab 09/23/20 0524 09/26/20 1331  NA 141 147*  K 4.7 4.1  CL 102 107  CO2 31 30  GLUCOSE 207* 190*  BUN 48* 48*  CREATININE 0.49 0.42*  CALCIUM 8.8* 9.3    ABG: Recent Labs  Lab 09/25/20 0925 09/26/20 0921  PHART 7.406 7.306*  PCO2ART 49.4* 64.2*  PO2ART 100 62.1*  HCO3 30.4* 31.4*  O2SAT 98.4 89.5    Liver Function Tests: No results for input(s): AST, ALT, ALKPHOS, BILITOT, PROT, ALBUMIN in the last 168 hours. No results for input(s): LIPASE, AMYLASE in the last 168 hours. No results for input(s): AMMONIA in the last 168 hours.  CBC: Recent Labs  Lab 09/23/20 0524 09/26/20 1331  WBC 8.7 9.6  HGB 8.2* 7.4*  HCT 27.0* 24.4*  MCV 115.9* 113.5*  PLT 140* 172    Cardiac Enzymes: No results for input(s): CKTOTAL, CKMB, CKMBINDEX, TROPONINI in the  last 168 hours.  BNP (last 3 results) Recent Labs    09/09/20 0623  BNP 137.4*    ProBNP (last 3 results) No results for input(s): PROBNP in the last 8760 hours.  Radiological Exams: DG Chest Port 1 View  Result Date: 09/26/2020 CLINICAL DATA:  Respiratory failure EXAM: PORTABLE CHEST 1 VIEW COMPARISON:  09/19/2020 FINDINGS: Severe diffuse bilateral airspace disease unchanged from the prior study. No pneumothorax. No significant effusion. Tracheostomy in good position. IMPRESSION: Severe bilateral airspace disease unchanged. Electronically Signed   By: Franchot Gallo M.D.   On: 09/26/2020 10:16    Assessment/Plan Active Problems:   Acute on chronic respiratory failure with hypoxia ([place])   COVID-19 virus infection   Pneumonia due to COVID-19 virus   History of pulmonary embolism   Severe sepsis ([place])   1. Acute on chronic respiratory failure hypoxia we will continue with assist control titrate oxygen continue pulmonary toilet.  Spoke with primary care team regarding the findings of the chest x-ray.  We are going to adjust antibiotics.  If we can get her FiO2 down may consider doing bronchoscopy for evaluation and cultures 2. COVID-19 virus infection recovery 3. Pneumonia due to COVID-19 treated 4. History of pulmonary embolism at baseline 5. Severe sepsis resolved   I have personally seen and evaluated the patient, evaluated laboratory and imaging results, formulated the assessment and plan and placed orders. The Patient requires  high complexity decision making with multiple systems involvement.  Rounds were done with the Respiratory Therapy Director and Staff therapists and discussed with nursing staff also.  [name], MD [facility] Pulmonary Critical Care Medicine Sleep Medicine

## 2020-09-28 DIAGNOSIS — U071 COVID-19: Secondary | ICD-10-CM | POA: Diagnosis not present

## 2020-09-28 DIAGNOSIS — Z86711 Personal history of pulmonary embolism: Secondary | ICD-10-CM | POA: Diagnosis not present

## 2020-09-28 DIAGNOSIS — J9621 Acute and chronic respiratory failure with hypoxia: Secondary | ICD-10-CM | POA: Diagnosis not present

## 2020-09-28 DIAGNOSIS — A419 Sepsis, unspecified organism: Secondary | ICD-10-CM | POA: Diagnosis not present

## 2020-09-28 LAB — BASIC METABOLIC PANEL
Anion gap: 9 (ref 5–15)
BUN: 52 mg/dL — ABNORMAL HIGH (ref 8–23)
CO2: 32 mmol/L (ref 22–32)
Calcium: 9 mg/dL (ref 8.9–10.3)
Chloride: 104 mmol/L (ref 98–111)
Creatinine, Ser: 0.4 mg/dL — ABNORMAL LOW (ref 0.44–1.00)
GFR, Estimated: >60 mL/min (ref >60)
Glucose, Bld: 247 mg/dL — ABNORMAL HIGH (ref 70–99)
Potassium: 4.5 mmol/L (ref 3.5–5.1)
Sodium: 145 mmol/L (ref 135–145)

## 2020-09-28 LAB — TROPONIN I (HIGH SENSITIVITY): Troponin I (High Sensitivity): 53 ng/L — ABNORMAL HIGH (ref <18)

## 2020-09-28 NOTE — Progress Notes (Signed)
Pulmonary Critical Care Medicine [place]   PULMONARY CRITICAL CARE SERVICE  PROGRESS NOTE  Date of Service: 09/28/2020  Marie Chavez  [id number]:[id number]  DOB: 1956/03/09   DOA: 09/02/2020  Referring Physician: Merton Border, MD  HPI: Marie Chavez is a 64 y.o. female seen for follow up of Acute on Chronic Respiratory Failure.  Patient right now is on 75% FiO2 with saturations greater than 92% she was started on more aggressive diuresis hopefully this will improve her oxygenation  Medications: Reviewed on Rounds  Physical Exam:  Vitals: Temperature is 96.4 pulse 81 respiratory rate 30 blood pressure is 121/73 saturations 92%  Ventilator Settings on assist control FiO2 75% tidal volume 440 PEEP 7  . General: Comfortable at this time . Eyes: Grossly normal lids, irises & conjunctiva . ENT: grossly tongue is normal . Neck: no obvious mass . Cardiovascular: S1 S2 normal no gallop . Respiratory: Scattered rhonchi expansion equal . Abdomen: soft . Skin: no rash seen on limited exam . Musculoskeletal: not rigid . Psychiatric:unable to assess . Neurologic: no seizure no involuntary movements         Lab Data:   Basic Metabolic Panel: Recent Labs  Lab 09/23/20 0524 09/26/20 1331 09/27/20 1008 09/28/20 0406  NA 141 147* 148* 145  K 4.7 4.1 4.1 4.5  CL 102 107 107 104  CO2 31 30 31  32  GLUCOSE 207* 190* 268* 247*  BUN 48* 48* 52* 52*  CREATININE 0.49 0.42* 0.37* 0.40*  CALCIUM 8.8* 9.3 9.2 9.0  MG  --   --  2.2  --     ABG: Recent Labs  Lab 09/25/20 0925 09/26/20 0921 09/27/20 1415  PHART 7.406 7.306* 7.319*  PCO2ART 49.4* 64.2* 63.5*  PO2ART 100 62.1* 62.6*  HCO3 30.4* 31.4* 32.1*  O2SAT 98.4 89.5 91.3    Liver Function Tests: No results for input(s): AST, ALT, ALKPHOS, BILITOT, PROT, ALBUMIN in the last 168 hours. No results for input(s): LIPASE, AMYLASE in the last 168 hours. No results for input(s): AMMONIA in the last 168  hours.  CBC: Recent Labs  Lab 09/23/20 0524 09/26/20 1331 09/27/20 1008  WBC 8.7 9.6 8.5  HGB 8.2* 7.4* 7.6*  HCT 27.0* 24.4* 25.8*  MCV 115.9* 113.5* 113.7*  PLT 140* 172 161    Cardiac Enzymes: No results for input(s): CKTOTAL, CKMB, CKMBINDEX, TROPONINI in the last 168 hours.  BNP (last 3 results) Recent Labs    09/09/20 0623  BNP 137.4*    ProBNP (last 3 results) No results for input(s): PROBNP in the last 8760 hours.  Radiological Exams: DG Chest Port 1 View  Result Date: 09/26/2020 CLINICAL DATA:  Respiratory failure EXAM: PORTABLE CHEST 1 VIEW COMPARISON:  09/19/2020 FINDINGS: Severe diffuse bilateral airspace disease unchanged from the prior study. No pneumothorax. No significant effusion. Tracheostomy in good position. IMPRESSION: Severe bilateral airspace disease unchanged. Electronically Signed   By: Franchot Gallo M.D.   On: 09/26/2020 10:16    Assessment/Plan Active Problems:   Acute on chronic respiratory failure with hypoxia ([place])   COVID-19 virus infection   Pneumonia due to COVID-19 virus   History of pulmonary embolism   Severe sepsis ([place])   1. Acute on chronic respiratory failure hypoxia we will continue to try to titrate oxygen down as the patient is improving with diuresis 2. COVID-19 virus infection in recovery 3. Pneumonia due to COVID-19 treated improved 4. History of pulmonary embolism patient is at baseline 5. Severe sepsis resolved  I have personally seen and evaluated the patient, evaluated laboratory and imaging results, formulated the assessment and plan and placed orders. The Patient requires high complexity decision making with multiple systems involvement.  Rounds were done with the Respiratory Therapy Director and Staff therapists and discussed with nursing staff also.  [name], MD [facility] Pulmonary Critical Care Medicine Sleep Medicine

## 2020-09-29 DIAGNOSIS — A419 Sepsis, unspecified organism: Secondary | ICD-10-CM | POA: Diagnosis not present

## 2020-09-29 DIAGNOSIS — U071 COVID-19: Secondary | ICD-10-CM | POA: Diagnosis not present

## 2020-09-29 DIAGNOSIS — J9621 Acute and chronic respiratory failure with hypoxia: Secondary | ICD-10-CM | POA: Diagnosis not present

## 2020-09-29 DIAGNOSIS — Z86711 Personal history of pulmonary embolism: Secondary | ICD-10-CM | POA: Diagnosis not present

## 2020-09-29 LAB — CULTURE, BLOOD (ROUTINE X 2)

## 2020-09-29 LAB — CULTURE, RESPIRATORY W GRAM STAIN

## 2020-09-29 NOTE — Progress Notes (Signed)
Pulmonary Critical Care Medicine [place]   PULMONARY CRITICAL CARE SERVICE  PROGRESS NOTE  Date of Service: 09/29/2020  Marie Chavez  MGN:[id number]  DOB: [birth year]   DOA: 09/02/2020  Referring Physician: Merton Border, MD  HPI: Marie Chavez is a 64 y.o. female seen for follow up of Acute on Chronic Respiratory Failure.  Patient right now is on 80% oxygen we will still try to work on diuresing her  Medications: Reviewed on Rounds  Physical Exam:  Vitals: Temperature is 96.1 pulse 79 respiratory rate 21 blood pressure is 141/60 saturations 99  Ventilator Settings assist control FiO2 80% tidal volume 440 PEEP 7  . General: Comfortable at this time . Eyes: Grossly normal lids, irises & conjunctiva . ENT: grossly tongue is normal . Neck: no obvious mass . Cardiovascular: S1 S2 normal no gallop . Respiratory: No rhonchi very coarse breath sounds . Abdomen: soft . Skin: no rash seen on limited exam . Musculoskeletal: not rigid . Psychiatric:unable to assess . Neurologic: no seizure no involuntary movements         Lab Data:   Basic Metabolic Panel: Recent Labs  Lab 09/23/20 0524 09/26/20 1331 09/27/20 1008 09/28/20 0406  NA 141 147* 148* 145  K 4.7 4.1 4.1 4.5  CL 102 107 107 104  CO2 31 30 31  32  GLUCOSE 207* 190* 268* 247*  BUN 48* 48* 52* 52*  CREATININE 0.49 0.42* 0.37* 0.40*  CALCIUM 8.8* 9.3 9.2 9.0  MG  --   --  2.2  --     ABG: Recent Labs  Lab 09/25/20 0925 09/26/20 0921 09/27/20 1415  PHART 7.406 7.306* 7.319*  PCO2ART 49.4* 64.2* 63.5*  PO2ART 100 62.1* 62.6*  HCO3 30.4* 31.4* 32.1*  O2SAT 98.4 89.5 91.3    Liver Function Tests: No results for input(s): AST, ALT, ALKPHOS, BILITOT, PROT, ALBUMIN in the last 168 hours. No results for input(s): LIPASE, AMYLASE in the last 168 hours. No results for input(s): AMMONIA in the last 168 hours.  CBC: Recent Labs  Lab 09/23/20 0524 09/26/20 1331 09/27/20 1008  WBC  8.7 9.6 8.5  HGB 8.2* 7.4* 7.6*  HCT 27.0* 24.4* 25.8*  MCV 115.9* 113.5* 113.7*  PLT 140* 172 161    Cardiac Enzymes: No results for input(s): CKTOTAL, CKMB, CKMBINDEX, TROPONINI in the last 168 hours.  BNP (last 3 results) Recent Labs    09/09/20 0623  BNP 137.4*    ProBNP (last 3 results) No results for input(s): PROBNP in the last 8760 hours.  Radiological Exams: No results found.  Assessment/Plan Active Problems:   Acute on chronic respiratory failure with hypoxia ([place])   COVID-19 virus infection   Pneumonia due to COVID-19 virus   History of pulmonary embolism   Severe sepsis ([place])   1. Acute on chronic respiratory failure hypoxia we will continue with full support on the vent right now on assist control mode titrate oxygen down as tolerated saturations were 99% 2. COVID-19 virus infection in recovery we will continue with supportive care 3. Pneumonia due to COVID-19 treated 4. History of pulmonary embolism at baseline we will monitor 5. Severe sepsis resolved   I have personally seen and evaluated the patient, evaluated laboratory and imaging results, formulated the assessment and plan and placed orders. The Patient requires high complexity decision making with multiple systems involvement.  Rounds were done with the Respiratory Therapy Director and Staff therapists and discussed with nursing staff also.  [name],  MD [facility] Pulmonary Critical Care Medicine Sleep Medicine

## 2020-10-01 DIAGNOSIS — A419 Sepsis, unspecified organism: Secondary | ICD-10-CM | POA: Diagnosis not present

## 2020-10-01 DIAGNOSIS — U071 COVID-19: Secondary | ICD-10-CM | POA: Diagnosis not present

## 2020-10-01 DIAGNOSIS — J9621 Acute and chronic respiratory failure with hypoxia: Secondary | ICD-10-CM | POA: Diagnosis not present

## 2020-10-01 DIAGNOSIS — Z86711 Personal history of pulmonary embolism: Secondary | ICD-10-CM | POA: Diagnosis not present

## 2020-10-01 LAB — CBC
HCT: 25.4 % — ABNORMAL LOW (ref 36.0–46.0)
Hemoglobin: 7.1 g/dL — ABNORMAL LOW (ref 12.0–15.0)
MCH: 32.9 pg (ref 26.0–34.0)
MCHC: 28 g/dL — ABNORMAL LOW (ref 30.0–36.0)
MCV: 117.6 fL — ABNORMAL HIGH (ref 80.0–100.0)
Platelets: 134 10*3/uL — ABNORMAL LOW (ref 150–400)
RBC: 2.16 MIL/uL — ABNORMAL LOW (ref 3.87–5.11)
RDW: 19.9 % — ABNORMAL HIGH (ref 11.5–15.5)
WBC: 9.1 10*3/uL (ref 4.0–10.5)
nRBC: 1.7 % — ABNORMAL HIGH (ref 0.0–0.2)

## 2020-10-01 LAB — BASIC METABOLIC PANEL
Anion gap: 8 (ref 5–15)
BUN: 51 mg/dL — ABNORMAL HIGH (ref 8–23)
CO2: 36 mmol/L — ABNORMAL HIGH (ref 22–32)
Calcium: 8.2 mg/dL — ABNORMAL LOW (ref 8.9–10.3)
Chloride: 106 mmol/L (ref 98–111)
Creatinine, Ser: 0.38 mg/dL — ABNORMAL LOW (ref 0.44–1.00)
GFR, Estimated: >60 mL/min (ref >60)
Glucose, Bld: 226 mg/dL — ABNORMAL HIGH (ref 70–99)
Potassium: 4 mmol/L (ref 3.5–5.1)
Sodium: 150 mmol/L — ABNORMAL HIGH (ref 135–145)

## 2020-10-01 LAB — CULTURE, BLOOD (ROUTINE X 2)
Culture: NO GROWTH
Special Requests: ADEQUATE

## 2020-10-01 LAB — VANCOMYCIN, TROUGH: Vancomycin Tr: 24 ug/mL (ref 15–20)

## 2020-10-01 NOTE — Progress Notes (Signed)
Pulmonary Critical Care Medicine [place]   PULMONARY CRITICAL CARE SERVICE  PROGRESS NOTE  Date of Service: 10/01/2020  TONAE LIVOLSI  TIR:[id number]  DOB: 1956-07-18   DOA: 09/02/2020  Referring Physician: Merton Border, MD  HPI: EVERLYN FARABAUGH is a 64 y.o. female seen for follow up of Acute on Chronic Respiratory Failure.  Patient right now is on assist control has been on 50% FiO2 daily tidal volumes are 440 and has been tolerating it well.  Patient's attempts at weaning however have not been going well  Medications: Reviewed on Rounds  Physical Exam:  Vitals: Temperature 96.2 pulse 87 respiratory rate 22 blood pressure is 149/84 saturations 93%  Ventilator Settings on assist control FiO2 is 50% tidal volume 440 PEEP 7  . General: Comfortable at this time . Eyes: Grossly normal lids, irises & conjunctiva . ENT: grossly tongue is normal . Neck: no obvious mass . Cardiovascular: S1 S2 normal no gallop . Respiratory: No rhonchi very coarse breath sounds are noted at this time. . Abdomen: soft . Skin: no rash seen on limited exam . Musculoskeletal: not rigid . Psychiatric:unable to assess . Neurologic: no seizure no involuntary movements         Lab Data:   Basic Metabolic Panel: Recent Labs  Lab 09/26/20 1331 09/27/20 1008 09/28/20 0406 10/01/20 0448  NA 147* 148* 145 150*  K 4.1 4.1 4.5 4.0  CL 107 107 104 106  CO2 30 31 32 36*  GLUCOSE 190* 268* 247* 226*  BUN 48* 52* 52* 51*  CREATININE 0.42* 0.37* 0.40* 0.38*  CALCIUM 9.3 9.2 9.0 8.2*  MG  --  2.2  --   --     ABG: Recent Labs  Lab 09/25/20 0925 09/26/20 0921 09/27/20 1415  PHART 7.406 7.306* 7.319*  PCO2ART 49.4* 64.2* 63.5*  PO2ART 100 62.1* 62.6*  HCO3 30.4* 31.4* 32.1*  O2SAT 98.4 89.5 91.3    Liver Function Tests: No results for input(s): AST, ALT, ALKPHOS, BILITOT, PROT, ALBUMIN in the last 168 hours. No results for input(s): LIPASE, AMYLASE in the last 168  hours. No results for input(s): AMMONIA in the last 168 hours.  CBC: Recent Labs  Lab 09/26/20 1331 09/27/20 1008 10/01/20 0448  WBC 9.6 8.5 9.1  HGB 7.4* 7.6* 7.1*  HCT 24.4* 25.8* 25.4*  MCV 113.5* 113.7* 117.6*  PLT 172 161 134*    Cardiac Enzymes: No results for input(s): CKTOTAL, CKMB, CKMBINDEX, TROPONINI in the last 168 hours.  BNP (last 3 results) Recent Labs    09/09/20 0623  BNP 137.4*    ProBNP (last 3 results) No results for input(s): PROBNP in the last 8760 hours.  Radiological Exams: No results found.  Assessment/Plan Active Problems:   Acute on chronic respiratory failure with hypoxia ([place])   COVID-19 virus infection   Pneumonia due to COVID-19 virus   History of pulmonary embolism   Severe sepsis ([place])   1. Acute on chronic respiratory failure with hypoxia patient remains on full support on assist control mode will titrate oxygen continue pulmonary toilet. 2. COVID-19 virus infection in recovery phase 3. Pneumonia due to COVID-19 treated we will monitor 4. Pulmonary embolism treated 5. Severe sepsis resolved   I have personally seen and evaluated the patient, evaluated laboratory and imaging results, formulated the assessment and plan and placed orders. The Patient requires high complexity decision making with multiple systems involvement.  Rounds were done with the Respiratory Therapy Director and Staff therapists and  discussed with nursing staff also.  [name], MD [facility] Pulmonary Critical Care Medicine Sleep Medicine

## 2020-10-02 ENCOUNTER — Other Ambulatory Visit (HOSPITAL_COMMUNITY): Payer: Medicare Other

## 2020-10-02 DIAGNOSIS — U071 COVID-19: Secondary | ICD-10-CM | POA: Diagnosis not present

## 2020-10-02 DIAGNOSIS — Z86711 Personal history of pulmonary embolism: Secondary | ICD-10-CM | POA: Diagnosis not present

## 2020-10-02 DIAGNOSIS — A419 Sepsis, unspecified organism: Secondary | ICD-10-CM | POA: Diagnosis not present

## 2020-10-02 DIAGNOSIS — J9621 Acute and chronic respiratory failure with hypoxia: Secondary | ICD-10-CM | POA: Diagnosis not present

## 2020-10-02 NOTE — Progress Notes (Signed)
Pulmonary Critical Care Medicine [place]   PULMONARY CRITICAL CARE SERVICE  PROGRESS NOTE  Date of Service: 10/02/2020  Marie Chavez  DPO:[id number]  DOB: 1956/02/02   DOA: 09/02/2020  Referring Physician: Merton Border, MD  HPI: Marie Chavez is a 64 y.o. female seen for follow up of Acute on Chronic Respiratory Failure.  Patient was once again attempted on pressure below wean but did not do well right now is on the ventilator and full support on assist control mode  Medications: Reviewed on Rounds  Physical Exam:  Vitals: Temperature 96.0 pulse 87 respiratory rate 21 blood pressure is 130/61 saturations 97%  Ventilator Settings on assist control FiO2 is 50% tidal volume 440 PEEP 7  . General: Comfortable at this time . Eyes: Grossly normal lids, irises & conjunctiva . ENT: grossly tongue is normal . Neck: no obvious mass . Cardiovascular: S1 S2 normal no gallop . Respiratory: No rhonchi very coarse breath sounds . Abdomen: soft . Skin: no rash seen on limited exam . Musculoskeletal: not rigid . Psychiatric:unable to assess . Neurologic: no seizure no involuntary movements         Lab Data:   Basic Metabolic Panel: Recent Labs  Lab 09/26/20 1331 09/27/20 1008 09/28/20 0406 10/01/20 0448  NA 147* 148* 145 150*  K 4.1 4.1 4.5 4.0  CL 107 107 104 106  CO2 30 31 32 36*  GLUCOSE 190* 268* 247* 226*  BUN 48* 52* 52* 51*  CREATININE 0.42* 0.37* 0.40* 0.38*  CALCIUM 9.3 9.2 9.0 8.2*  MG  --  2.2  --   --     ABG: Recent Labs  Lab 09/25/20 0925 09/26/20 0921 09/27/20 1415  PHART 7.406 7.306* 7.319*  PCO2ART 49.4* 64.2* 63.5*  PO2ART 100 62.1* 62.6*  HCO3 30.4* 31.4* 32.1*  O2SAT 98.4 89.5 91.3    Liver Function Tests: No results for input(s): AST, ALT, ALKPHOS, BILITOT, PROT, ALBUMIN in the last 168 hours. No results for input(s): LIPASE, AMYLASE in the last 168 hours. No results for input(s): AMMONIA in the last 168  hours.  CBC: Recent Labs  Lab 09/26/20 1331 09/27/20 1008 10/01/20 0448  WBC 9.6 8.5 9.1  HGB 7.4* 7.6* 7.1*  HCT 24.4* 25.8* 25.4*  MCV 113.5* 113.7* 117.6*  PLT 172 161 134*    Cardiac Enzymes: No results for input(s): CKTOTAL, CKMB, CKMBINDEX, TROPONINI in the last 168 hours.  BNP (last 3 results) Recent Labs    09/09/20 0623  BNP 137.4*    ProBNP (last 3 results) No results for input(s): PROBNP in the last 8760 hours.  Radiological Exams: No results found.  Assessment/Plan Active Problems:   Acute on chronic respiratory failure with hypoxia ([place])   COVID-19 virus infection   Pneumonia due to COVID-19 virus   History of pulmonary embolism   Severe sepsis ([place])   1. Acute on chronic respiratory failure with hypoxia we will continue with assist control patient has failed attempts at weaning on pressure support 2. COVID-19 virus infection recovery 3. Pneumonia due to COVID-19 treated slow improvement 4. History of pulmonary embolism at baseline 5. Severe sepsis this has resolved   I have personally seen and evaluated the patient, evaluated laboratory and imaging results, formulated the assessment and plan and placed orders. The Patient requires high complexity decision making with multiple systems involvement.  Rounds were done with the Respiratory Therapy Director and Staff therapists and discussed with nursing staff also.  [name], MD  [facility] Pulmonary Critical Care Medicine Sleep Medicine

## 2020-10-02 NOTE — Consult Note (Signed)
Infectious Disease Consultation   Marie Chavez  OIN:[id number]  DOB: [birth year]  DOA: 09/02/2020  Requesting physician: Dr. Owens Shark  Reason for consultation: Antibiotic recommendations   History of Present Illness: Marie Chavez is an 64 y.o. female with medical history of asthma who initially presented to [facility] on 07/28/2020 with 4-day history of worsening shortness of breath.  Patient apparently tested positive for COVID-19 infection on 07/25/2020.  She continued to have cough with worsening shortness of breath nausea, diaphoresis.  She apparently is on 3 L oxygen at baseline per records from the outside facility.  On EMS arrival patient had 72% oxygen saturation and was placed on nonrebreather with 60 L with some improvement.  Regarding her COVID-19 infection she received monoclonal antibodies - Casirivimab & Imdevimab.  On 07/30/2019 when she was placed on BiPAP.  However, continued to have worsening respiratory failure therefore on 07/31/2019 when she was intubated.  On 08/08/2020 patient had head CT which showed concerns for acute infarct.  Neurology was consulted.  MRI was negative.  On 08/16/2019 when she had tracheostomy. On 08/26/2019 patient was noted to have fever and leukocytosis therefore started on antibiotics.  There was concern for septic arthritis in the right knee.  Ortho was consulted.  She had fluid aspiration which was negative.  Due to her complex medical problems she was transferred to [facility] and admitted here on 09/02/2020.  Patient currently is on 50% FiO2, PEEP of 7.  She is able to open eyes and nod her head when asked questions.  She has severe debility with generalized weakness.  Respirate cultures from 09/06/2020 showed Pseudomonas aeruginosa for which she was treated with ceftazidime.  She has repeat respiratory cultures from 09/26/2020 which again is showing moderate Pseudomonas aeruginosa.  She was started on IV  vancomycin, ceftazidime, Flagyl.  Review of Systems:  Patient nonverbal.  Unable to obtain review of systems at this time.  Past Medical History: Past Medical History:  Diagnosis Date  . Acute on chronic respiratory failure with hypoxia ([place])   . Bronchitis    Hx of  . COVID-19 virus infection   . Depression   . DJD (degenerative joint disease)   . Dry skin   . Environmental allergies   . GERD (gastroesophageal reflux disease)   . Herniated disc   . History of pulmonary embolism   . Hypertension   . Hypoglycemia   . Hypothyroidism   . Panic disorder   . Pneumonia    Hx of "walking Pneumonia"   . Pneumonia due to COVID-19 virus   . PONV (postoperative nausea and vomiting)    Had a panic attack upon awakening from Hysterectomy   . Seasonal allergies   . Severe sepsis ([place])   . Shortness of breath    when bending over or just sitting thinks it comes from lung mass  . Tachycardia    Takes Atenolol  . Urgency of urination     Past Surgical History: Past Surgical History:  Procedure Laterality Date  . APPENDECTOMY    . CHOLECYSTECTOMY    . HERNIA REPAIR     X 3  . VAGINAL HYSTERECTOMY    . Vaginal sling  2012  . VIDEO ASSISTED THORACOSCOPY (VATS)/WEDGE RESECTION Left 11/28/2013   Procedure: VIDEO ASSISTED THORACOSCOPY (VATS)  Lobectomy;  Surgeon: Grace Isaac, MD;  Location: [place];  Service: Thoracic;  Laterality: Left;  .  VIDEO BRONCHOSCOPY N/A 11/28/2013   Procedure: VIDEO BRONCHOSCOPY;  Surgeon: Grace Isaac, MD;  Location: [facility];  Service: Thoracic;  Laterality: N/A;     Allergies:   Allergies  Allergen Reactions  . Bee Venom Anaphylaxis and Other (See Comments)    Developed "blue rings" all over body after being stung  . Celebrex [Celecoxib]     REACTION: heart rate increases, itching  . Mobic [Meloxicam] Itching    HEART RACES  . Prednisone     REACTION: Heart rate increases, itching     Social History:  reports that she has never smoked. She  has never used smokeless tobacco. She reports that she does not drink alcohol and does not use drugs.   Family History: Family History  Problem Relation Age of Onset  . Breast cancer Sister    Physical Exam: Vitals: Temperature 98, pulse 91, respiratory rate 18, blood pressure 138/70, pulse oximetry 93% on 50% FiO2, PEEP of 7 Constitutional: Ill-appearing female, opening eyes Head: Atraumatic, normocephalic Eyes: PERLA, EOMI  ENMT: external ears and nose appear normal, normal hearing, Lips appears normal, moist oral mucosa  Neck: trach in place   CVS: S1-S2  Respiratory: Rhonchi, no wheezing Abdomen: soft nontender, nondistended, positive bowel sounds Musculoskeletal: Edema Neuro: Debility with generalized weakness Psych: stable mood and affect Skin: She has extensive bruising on both her upper extremities and also has some weeping due to swelling/edema.  Data reviewed:  I have personally reviewed following labs and imaging studies Labs:  CBC: Recent Labs  Lab 09/26/20 1331 09/27/20 1008 10/01/20 0448  WBC 9.6 8.5 9.1  HGB 7.4* 7.6* 7.1*  HCT 24.4* 25.8* 25.4*  MCV 113.5* 113.7* 117.6*  PLT 172 161 134*    Basic Metabolic Panel: Recent Labs  Lab 09/26/20 1331 09/27/20 1008 09/28/20 0406 10/01/20 0448  NA 147* 148* 145 150*  K 4.1 4.1 4.5 4.0  CL 107 107 104 106  CO2 30 31 32 36*  GLUCOSE 190* 268* 247* 226*  BUN 48* 52* 52* 51*  CREATININE 0.42* 0.37* 0.40* 0.38*  CALCIUM 9.3 9.2 9.0 8.2*  MG  --  2.2  --   --    GFR CrCl cannot be calculated (Unknown ideal weight.). Liver Function Tests: No results for input(s): AST, ALT, ALKPHOS, BILITOT, PROT, ALBUMIN in the last 168 hours. No results for input(s): LIPASE, AMYLASE in the last 168 hours. No results for input(s): AMMONIA in the last 168 hours. Coagulation profile No results for input(s): INR, PROTIME in the last 168 hours.  Cardiac Enzymes: No results for input(s): CKTOTAL, CKMB, CKMBINDEX, TROPONINI  in the last 168 hours. BNP: Invalid input(s): POCBNP CBG: No results for input(s): GLUCAP in the last 168 hours. D-Dimer No results for input(s): DDIMER in the last 72 hours. Hgb A1c No results for input(s): HGBA1C in the last 72 hours. Lipid Profile No results for input(s): CHOL, HDL, LDLCALC, TRIG, CHOLHDL, LDLDIRECT in the last 72 hours. Thyroid function studies No results for input(s): TSH, T4TOTAL, T3FREE, THYROIDAB in the last 72 hours.  Invalid input(s): FREET3 Anemia work up No results for input(s): VITAMINB12, FOLATE, FERRITIN, TIBC, IRON, RETICCTPCT in the last 72 hours. Urinalysis    Component Value Date/Time   COLORURINE YELLOW 09/02/2020 1610   APPEARANCEUR HAZY (A) 09/02/2020 1610   LABSPEC 1.017 09/02/2020 1610   PHURINE 5.0 09/02/2020 1610   GLUCOSEU NEGATIVE 09/02/2020 1610   HGBUR LARGE (A) 09/02/2020 1610   BILIRUBINUR NEGATIVE 09/02/2020 1610   KETONESUR  NEGATIVE 09/02/2020 1610   PROTEINUR 30 (A) 09/02/2020 1610   UROBILINOGEN 0.2 11/29/2013 0948   NITRITE NEGATIVE 09/02/2020 1610   LEUKOCYTESUR NEGATIVE 09/02/2020 1610     Microbiology Recent Results (from the past 240 hour(s))  Culture, blood (routine x 2)     Status: Abnormal   Collection Time: 09/26/20  1:30 PM   Specimen: BLOOD LEFT HAND  Result Value Ref Range Status   Specimen Description BLOOD LEFT HAND  Final   Special Requests   Final    BOTTLES DRAWN AEROBIC AND ANAEROBIC Blood Culture results may not be optimal due to an inadequate volume of blood received in culture bottles   Culture  Setup Time   Final    GRAM POSITIVE COCCI IN CLUSTERS ANAEROBIC BOTTLE ONLY CRITICAL RESULT CALLED TO, READ BACK BY AND VERIFIED WITH: RN J.FURIGEY AT 3845 ON 09/27/2020 BY T.SAAD    Culture (A)  Final    STAPHYLOCOCCUS EPIDERMIDIS THE SIGNIFICANCE OF ISOLATING THIS ORGANISM FROM A SINGLE SET OF BLOOD CULTURES WHEN MULTIPLE SETS ARE DRAWN IS UNCERTAIN. PLEASE NOTIFY THE MICROBIOLOGY DEPARTMENT WITHIN  ONE WEEK IF SPECIATION AND SENSITIVITIES ARE REQUIRED. Performed at [facility], [place] [address] [place], [place] [address]    Report Status 09/29/2020 FINAL  Final  Blood Culture ID Panel (Reflexed)     Status: Abnormal   Collection Time: 09/26/20  1:30 PM  Result Value Ref Range Status   Enterococcus faecalis NOT DETECTED NOT DETECTED Final   Enterococcus Faecium NOT DETECTED NOT DETECTED Final   Listeria monocytogenes NOT DETECTED NOT DETECTED Final   Staphylococcus species DETECTED (A) NOT DETECTED Final    Comment: CRITICAL RESULT CALLED TO, READ BACK BY AND VERIFIED WITH: RN J.FURIGEY ON 1449 ON 09/27/2020 BY T.SAAD    Staphylococcus aureus (BCID) NOT DETECTED NOT DETECTED Final   Staphylococcus epidermidis DETECTED (A) NOT DETECTED Final    Comment: Methicillin (oxacillin) resistant coagulase negative staphylococcus. Possible blood culture contaminant (unless isolated from more than one blood culture draw or clinical case suggests pathogenicity). No antibiotic treatment is indicated for blood  culture contaminants. CRITICAL RESULT CALLED TO, READ BACK BY AND VERIFIED WITH: RN J.FURIGEY ON 1449 ON 09/27/2020 BY T.SAAD    Staphylococcus lugdunensis NOT DETECTED NOT DETECTED Final   Streptococcus species NOT DETECTED NOT DETECTED Final   Streptococcus agalactiae NOT DETECTED NOT DETECTED Final   Streptococcus pneumoniae NOT DETECTED NOT DETECTED Final   Streptococcus pyogenes NOT DETECTED NOT DETECTED Final   A.calcoaceticus-baumannii NOT DETECTED NOT DETECTED Final   Bacteroides fragilis NOT DETECTED NOT DETECTED Final   Enterobacterales NOT DETECTED NOT DETECTED Final   Enterobacter cloacae complex NOT DETECTED NOT DETECTED Final   Escherichia coli NOT DETECTED NOT DETECTED Final   Klebsiella aerogenes NOT DETECTED NOT DETECTED Final   Klebsiella oxytoca NOT DETECTED NOT DETECTED Final   Klebsiella pneumoniae NOT DETECTED NOT DETECTED Final   Proteus species NOT  DETECTED NOT DETECTED Final   Salmonella species NOT DETECTED NOT DETECTED Final   Serratia marcescens NOT DETECTED NOT DETECTED Final   Haemophilus influenzae NOT DETECTED NOT DETECTED Final   Neisseria meningitidis NOT DETECTED NOT DETECTED Final   Pseudomonas aeruginosa NOT DETECTED NOT DETECTED Final   Stenotrophomonas maltophilia NOT DETECTED NOT DETECTED Final   Candida albicans NOT DETECTED NOT DETECTED Final   Candida auris NOT DETECTED NOT DETECTED Final   Candida glabrata NOT DETECTED NOT DETECTED Final   Candida krusei NOT DETECTED NOT DETECTED Final  Candida parapsilosis NOT DETECTED NOT DETECTED Final   Candida tropicalis NOT DETECTED NOT DETECTED Final   Cryptococcus neoformans/gattii NOT DETECTED NOT DETECTED Final   Methicillin resistance mecA/C DETECTED (A) NOT DETECTED Final    Comment: CRITICAL RESULT CALLED TO, READ BACK BY AND VERIFIED WITH: RN J.FURIGEY ON 1449 ON 09/27/2020 BY T.SAAD Performed at [facility], [place] [address], [place], [place] [address]   Culture, blood (routine x 2)     Status: None   Collection Time: 09/26/20  1:40 PM   Specimen: BLOOD  Result Value Ref Range Status   Specimen Description BLOOD LEFT ANTECUBITAL  Final   Special Requests   Final    BOTTLES DRAWN AEROBIC AND ANAEROBIC Blood Culture adequate volume   Culture   Final    NO GROWTH 5 DAYS Performed at [facility], [address], [place], [place] [address]    Report Status 10/01/2020 FINAL  Final  Culture, respiratory (non-expectorated)     Status: None   Collection Time: 09/26/20  3:57 PM   Specimen: Tracheal Aspirate; Respiratory  Result Value Ref Range Status   Specimen Description TRACHEAL ASPIRATE  Final   Special Requests NONE  Final   Gram Stain   Final    FEW WBC PRESENT,BOTH PMN AND MONONUCLEAR FEW GRAM NEGATIVE RODS FEW GRAM POSITIVE RODS Performed at [facility], [place] [address] [place],  [address]    Culture MODERATE PSEUDOMONAS  AERUGINOSA  Final   Report Status 09/29/2020 FINAL  Final   Organism ID, Bacteria PSEUDOMONAS AERUGINOSA  Final      Susceptibility   Pseudomonas aeruginosa - MIC*    CEFTAZIDIME 8 SENSITIVE Sensitive     CIPROFLOXACIN <=0.25 SENSITIVE Sensitive     GENTAMICIN <=1 SENSITIVE Sensitive     IMIPENEM 1 SENSITIVE Sensitive     * MODERATE PSEUDOMONAS AERUGINOSA       Inpatient Medications:   Please see MAR   Radiological Exams on Admission: DG CHEST PORT 1 VIEW  Result Date: 10/02/2020 CLINICAL DATA:  Pulmonary edema. EXAM: PORTABLE CHEST 1 VIEW COMPARISON:  Chest x-ray dated September 26, 2020. FINDINGS: Unchanged tracheostomy tube. Severe diffuse bilateral airspace disease obscuring the cardiac silhouette, mildly improved in the upper lobes. No pneumothorax or large pleural effusion. No acute osseous abnormality. IMPRESSION: 1. Severe diffuse bilateral airspace disease, mildly improved in the upper lobes. Electronically Signed   By: Titus Dubin M.D.   On: 10/02/2020 14:59    Impression/Recommendations Active Problems:   Acute on chronic respiratory failure with hypoxia, ventilator dependent   Pneumonia with Pseudomonas   COVID-19 virus infection facial herpes zoster Systemic inflammatory response syndrome COPD Protein calorie malnutrition/dysphagia History of seizures Hypertension/hypothyroidism Thrombocytopenia    Acute on chronic respiratory failure with hypoxemia: Patient remains ventilator dependent.  She has underlying COPD on baseline oxygen of 3 L.  Currently on 50% FiO2, 7 of PEEP.  Pulmonary following.  She had respiratory cultures that showed Pseudomonas.  She was already treated with ceftazidime.  Repeat respiratory cultures from 09/26/2020 again showing the Pseudomonas.  She has been restarted on Tazicef and metronidazole.  She was also on vancomycin but supratherapeutic therefore vancomycin discontinued.  Chest x-ray from today per report severe diffuse bilateral  airspace disease.  Monitor closely.  If she is not improving recommend to switch to ciprofloxacin and Flagyl and consider repeating chest imaging preferably chest CT to better evaluate.  She is also high risk for ARDS secondary to  recent COVID-19 infection.  Pulmonary following.  Pneumonia: She initially had COVID-19 infection and subsequently had secondary bacterial pneumonia with respiratory cultures that showed Pseudomonas.  She already was treated with ceftazidime, now restarted on IV antibiotics with vancomycin, Tazicef and Flagyl.  Do not think she needs the vancomycin at this time.  Will recommend to discontinue the vancomycin. Plan to treat for duration of 1 week pending improvement. If she is not improving suggest switching to ciprofloxacin and add Flagyl for anaerobic coverage and consider repeating chest imaging preferably chest CT to better evaluate.    Facial herpes zoster: Per primary team patient received treatment with IV acyclovir.  Improved.  Systemic inflammatory response syndrome: Likely secondary to the pneumonia. Blood cultures from 09/26/2020 showing gram-positive cocci, reported as staph epidermidis.  Likely contaminant?   Antibiotics as mentioned above.    COPD: Continue medication and management per the primary team.  Protein calorie malnutrition/dysphagia: Management per the primary team.  Due to her dysphagia she is high risk for aspiration and aspiration pneumonia.  History of seizures: Antibiotics at times tend to lower the seizure threshold.  However, at this time she needs antibiotics because of the pneumonia.  Continue to monitor closely. Continue medications and management per primary team.  Hypertension/hypothyroidism: Continue medication and management per primary team.  Thrombocytopenia: Patient noted to have low platelet count.  She also has bruising of her upper extremities. Continue to monitor closely.  Further investigation and management per the primary  team.  Due to her complex medical problems she is high risk for worsening and decompensation.  Plan of care discussed with the primary team and pharmacy.  Thank you for this consultation.    Yaakov Guthrie M.D. 10/02/2020, 5:46 PM

## 2020-10-03 DIAGNOSIS — Z86711 Personal history of pulmonary embolism: Secondary | ICD-10-CM | POA: Diagnosis not present

## 2020-10-03 DIAGNOSIS — J9621 Acute and chronic respiratory failure with hypoxia: Secondary | ICD-10-CM | POA: Diagnosis not present

## 2020-10-03 DIAGNOSIS — U071 COVID-19: Secondary | ICD-10-CM | POA: Diagnosis not present

## 2020-10-03 DIAGNOSIS — A419 Sepsis, unspecified organism: Secondary | ICD-10-CM | POA: Diagnosis not present

## 2020-10-03 LAB — CBC
HCT: 29.2 % — ABNORMAL LOW (ref 36.0–46.0)
Hemoglobin: 8.6 g/dL — ABNORMAL LOW (ref 12.0–15.0)
MCH: 34.8 pg — ABNORMAL HIGH (ref 26.0–34.0)
MCHC: 29.5 g/dL — ABNORMAL LOW (ref 30.0–36.0)
MCV: 118.2 fL — ABNORMAL HIGH (ref 80.0–100.0)
Platelets: 117 10*3/uL — ABNORMAL LOW (ref 150–400)
RBC: 2.47 MIL/uL — ABNORMAL LOW (ref 3.87–5.11)
RDW: 19.9 % — ABNORMAL HIGH (ref 11.5–15.5)
WBC: 10.2 10*3/uL (ref 4.0–10.5)
nRBC: 2.2 % — ABNORMAL HIGH (ref 0.0–0.2)

## 2020-10-03 LAB — BASIC METABOLIC PANEL
Anion gap: 10 (ref 5–15)
BUN: 56 mg/dL — ABNORMAL HIGH (ref 8–23)
CO2: 38 mmol/L — ABNORMAL HIGH (ref 22–32)
Calcium: 8.4 mg/dL — ABNORMAL LOW (ref 8.9–10.3)
Chloride: 104 mmol/L (ref 98–111)
Creatinine, Ser: 0.42 mg/dL — ABNORMAL LOW (ref 0.44–1.00)
GFR, Estimated: >60 mL/min (ref >60)
Glucose, Bld: 177 mg/dL — ABNORMAL HIGH (ref 70–99)
Potassium: 5 mmol/L (ref 3.5–5.1)
Sodium: 152 mmol/L — ABNORMAL HIGH (ref 135–145)

## 2020-10-03 LAB — MAGNESIUM: Magnesium: 2.7 mg/dL — ABNORMAL HIGH (ref 1.7–2.4)

## 2020-10-03 NOTE — Progress Notes (Signed)
Pulmonary Critical Care Medicine [place]   PULMONARY CRITICAL CARE SERVICE  PROGRESS NOTE  Date of Service: 10/03/2020  BRILYNN BIASI  GLO:[id number]  DOB: Sep 19, 1956   DOA: 09/02/2020  Referring Physician: Merton Border, MD  HPI: Marie Chavez is a 64 y.o. female seen for follow up of Acute on Chronic Respiratory Failure.  Patient is on assist control currently on 40% FiO2 with good saturations.  Secretions are moderate  Medications: Reviewed on Rounds  Physical Exam:  Vitals: Temperature is 96 pulse 97 respiratory 17 blood pressure is 139/72 saturations 100%  Ventilator Settings on assist control FiO2 is 40% tidal volume 440 PEEP of 5  . General: Comfortable at this time . Eyes: Grossly normal lids, irises & conjunctiva . ENT: grossly tongue is normal . Neck: no obvious mass . Cardiovascular: S1 S2 normal no gallop . Respiratory: No rhonchi are noted at this time. . Abdomen: soft . Skin: no rash seen on limited exam . Musculoskeletal: not rigid . Psychiatric:unable to assess . Neurologic: no seizure no involuntary movements         Lab Data:   Basic Metabolic Panel: Recent Labs  Lab 09/26/20 1331 09/27/20 1008 09/28/20 0406 10/01/20 0448 10/03/20 0526  NA 147* 148* 145 150* 152*  K 4.1 4.1 4.5 4.0 5.0  CL 107 107 104 106 104  CO2 30 31 32 36* 38*  GLUCOSE 190* 268* 247* 226* 177*  BUN 48* 52* 52* 51* 56*  CREATININE 0.42* 0.37* 0.40* 0.38* 0.42*  CALCIUM 9.3 9.2 9.0 8.2* 8.4*  MG  --  2.2  --   --  2.7*    ABG: Recent Labs  Lab 09/27/20 1415  PHART 7.319*  PCO2ART 63.5*  PO2ART 62.6*  HCO3 32.1*  O2SAT 91.3    Liver Function Tests: No results for input(s): AST, ALT, ALKPHOS, BILITOT, PROT, ALBUMIN in the last 168 hours. No results for input(s): LIPASE, AMYLASE in the last 168 hours. No results for input(s): AMMONIA in the last 168 hours.  CBC: Recent Labs  Lab 09/26/20 1331 09/27/20 1008 10/01/20 0448  10/03/20 0526  WBC 9.6 8.5 9.1 10.2  HGB 7.4* 7.6* 7.1* 8.6*  HCT 24.4* 25.8* 25.4* 29.2*  MCV 113.5* 113.7* 117.6* 118.2*  PLT 172 161 134* 117*    Cardiac Enzymes: No results for input(s): CKTOTAL, CKMB, CKMBINDEX, TROPONINI in the last 168 hours.  BNP (last 3 results) Recent Labs    09/09/20 0623  BNP 137.4*    ProBNP (last 3 results) No results for input(s): PROBNP in the last 8760 hours.  Radiological Exams: DG CHEST PORT 1 VIEW  Result Date: 10/02/2020 CLINICAL DATA:  Pulmonary edema. EXAM: PORTABLE CHEST 1 VIEW COMPARISON:  Chest x-ray dated September 26, 2020. FINDINGS: Unchanged tracheostomy tube. Severe diffuse bilateral airspace disease obscuring the cardiac silhouette, mildly improved in the upper lobes. No pneumothorax or large pleural effusion. No acute osseous abnormality. IMPRESSION: 1. Severe diffuse bilateral airspace disease, mildly improved in the upper lobes. Electronically Signed   By: Titus Dubin M.D.   On: 10/02/2020 14:59    Assessment/Plan Active Problems:   Acute on chronic respiratory failure with hypoxia ([place])   COVID-19 virus infection   Pneumonia due to COVID-19 virus   History of pulmonary embolism   Severe sepsis ([place])   1. Acute on chronic respiratory failure hypoxia we will continue with assist control titrate oxygen continue pulmonary toilet. 2. COVID-19 virus infection supportive care 3. Pneumonia due to COVID-19  treated 4. History of pulmonary embolism at baseline 5. Severe sepsis resolved   I have personally seen and evaluated the patient, evaluated laboratory and imaging results, formulated the assessment and plan and placed orders. The Patient requires high complexity decision making with multiple systems involvement.  Rounds were done with the Respiratory Therapy Director and Staff therapists and discussed with nursing staff also.  [name], MD [facility] Pulmonary Critical Care Medicine Sleep Medicine

## 2020-10-04 DIAGNOSIS — U071 COVID-19: Secondary | ICD-10-CM | POA: Diagnosis not present

## 2020-10-04 DIAGNOSIS — J9621 Acute and chronic respiratory failure with hypoxia: Secondary | ICD-10-CM | POA: Diagnosis not present

## 2020-10-04 DIAGNOSIS — Z86711 Personal history of pulmonary embolism: Secondary | ICD-10-CM | POA: Diagnosis not present

## 2020-10-04 DIAGNOSIS — A419 Sepsis, unspecified organism: Secondary | ICD-10-CM | POA: Diagnosis not present

## 2020-10-04 NOTE — Progress Notes (Signed)
Pulmonary Critical Care Medicine [place]   PULMONARY CRITICAL CARE SERVICE  PROGRESS NOTE  Date of Service: 10/04/2020  Marie Chavez  QQI:[id number]  DOB: 11-07-1955   DOA: 09/02/2020  Referring Physician: Merton Border, MD  HPI: Marie Chavez is a 64 y.o. female seen for follow up of Acute on Chronic Respiratory Failure.  Patient currently is on pressure support has been on 40% FiO2 12/7 good saturations able to do 12 hours yesterday we will get a try to continue to advance  Medications: Reviewed on Rounds  Physical Exam:  Vitals: Temperature is 96.5 pulse 84 respiratory 18 blood pressure is 118/63 saturations 100%  Ventilator Settings on pressure support FiO2 40%  . General: Comfortable at this time . Eyes: Grossly normal lids, irises & conjunctiva . ENT: grossly tongue is normal . Neck: no obvious mass . Cardiovascular: S1 S2 normal no gallop . Respiratory: Coarse breath sounds are noted bilaterally . Abdomen: soft . Skin: no rash seen on limited exam . Musculoskeletal: not rigid . Psychiatric:unable to assess . Neurologic: no seizure no involuntary movements         Lab Data:   Basic Metabolic Panel: Recent Labs  Lab 09/27/20 1008 09/28/20 0406 10/01/20 0448 10/03/20 0526  NA 148* 145 150* 152*  K 4.1 4.5 4.0 5.0  CL 107 104 106 104  CO2 31 32 36* 38*  GLUCOSE 268* 247* 226* 177*  BUN 52* 52* 51* 56*  CREATININE 0.37* 0.40* 0.38* 0.42*  CALCIUM 9.2 9.0 8.2* 8.4*  MG 2.2  --   --  2.7*    ABG: Recent Labs  Lab 09/27/20 1415  PHART 7.319*  PCO2ART 63.5*  PO2ART 62.6*  HCO3 32.1*  O2SAT 91.3    Liver Function Tests: No results for input(s): AST, ALT, ALKPHOS, BILITOT, PROT, ALBUMIN in the last 168 hours. No results for input(s): LIPASE, AMYLASE in the last 168 hours. No results for input(s): AMMONIA in the last 168 hours.  CBC: Recent Labs  Lab 09/27/20 1008 10/01/20 0448 10/03/20 0526  WBC 8.5 9.1 10.2  HGB  7.6* 7.1* 8.6*  HCT 25.8* 25.4* 29.2*  MCV 113.7* 117.6* 118.2*  PLT 161 134* 117*    Cardiac Enzymes: No results for input(s): CKTOTAL, CKMB, CKMBINDEX, TROPONINI in the last 168 hours.  BNP (last 3 results) Recent Labs    09/09/20 0623  BNP 137.4*    ProBNP (last 3 results) No results for input(s): PROBNP in the last 8760 hours.  Radiological Exams: DG CHEST PORT 1 VIEW  Result Date: 10/02/2020 CLINICAL DATA:  Pulmonary edema. EXAM: PORTABLE CHEST 1 VIEW COMPARISON:  Chest x-ray dated September 26, 2020. FINDINGS: Unchanged tracheostomy tube. Severe diffuse bilateral airspace disease obscuring the cardiac silhouette, mildly improved in the upper lobes. No pneumothorax or large pleural effusion. No acute osseous abnormality. IMPRESSION: 1. Severe diffuse bilateral airspace disease, mildly improved in the upper lobes. Electronically Signed   By: Titus Dubin M.D.   On: 10/02/2020 14:59    Assessment/Plan Active Problems:   Acute on chronic respiratory failure with hypoxia ([place])   COVID-19 virus infection   Pneumonia due to COVID-19 virus   History of pulmonary embolism   Severe sepsis ([place])   1. Acute on chronic respiratory failure hypoxia we will continue with the weaning on pressure support as tolerated titrate oxygen continue pulmonary toilet. 2. COVID-19 virus infection recovery 3. Pneumonia due to COVID-19 treated 4. History of pulmonary embolism no change 5. Severe sepsis  resolved   I have personally seen and evaluated the patient, evaluated laboratory and imaging results, formulated the assessment and plan and placed orders. The Patient requires high complexity decision making with multiple systems involvement.  Rounds were done with the Respiratory Therapy Director and Staff therapists and discussed with nursing staff also.  [name], MD [facility] Pulmonary Critical Care Medicine Sleep Medicine

## 2020-10-05 DIAGNOSIS — U071 COVID-19: Secondary | ICD-10-CM | POA: Diagnosis not present

## 2020-10-05 DIAGNOSIS — Z86711 Personal history of pulmonary embolism: Secondary | ICD-10-CM | POA: Diagnosis not present

## 2020-10-05 DIAGNOSIS — A419 Sepsis, unspecified organism: Secondary | ICD-10-CM | POA: Diagnosis not present

## 2020-10-05 DIAGNOSIS — J9621 Acute and chronic respiratory failure with hypoxia: Secondary | ICD-10-CM | POA: Diagnosis not present

## 2020-10-05 LAB — BASIC METABOLIC PANEL
Anion gap: 14 (ref 5–15)
BUN: 58 mg/dL — ABNORMAL HIGH (ref 8–23)
CO2: 34 mmol/L — ABNORMAL HIGH (ref 22–32)
Calcium: 8.8 mg/dL — ABNORMAL LOW (ref 8.9–10.3)
Chloride: 104 mmol/L (ref 98–111)
Creatinine, Ser: 0.42 mg/dL — ABNORMAL LOW (ref 0.44–1.00)
GFR, Estimated: >60 mL/min (ref >60)
Glucose, Bld: 212 mg/dL — ABNORMAL HIGH (ref 70–99)
Potassium: 4.2 mmol/L (ref 3.5–5.1)
Sodium: 152 mmol/L — ABNORMAL HIGH (ref 135–145)

## 2020-10-05 NOTE — Progress Notes (Signed)
Pulmonary Critical Care Medicine [place]   PULMONARY CRITICAL CARE SERVICE  PROGRESS NOTE  Date of Service: 10/05/2020  Marie Chavez  YWV:[id number]  DOB: [birth year]   DOA: 09/02/2020  Referring Physician: Merton Border, MD  HPI: Marie Chavez is a 64 y.o. female seen for follow up of Acute on Chronic Respiratory Failure.  Patient is on pressure support today the goal is for 16 hours  Medications: Reviewed on Rounds  Physical Exam:  Vitals: Temperature is 96.9 pulse 84 respiratory rate 18 blood pressure is 136/72 saturations 100%  Ventilator Settings on pressure support FiO2 40% goal of 16-hour  . General: Comfortable at this time . Eyes: Grossly normal lids, irises & conjunctiva . ENT: grossly tongue is normal . Neck: no obvious mass . Cardiovascular: S1 S2 normal no gallop . Respiratory: No rhonchi no rales. . Abdomen: soft . Skin: no rash seen on limited exam . Musculoskeletal: not rigid . Psychiatric:unable to assess . Neurologic: no seizure no involuntary movements         Lab Data:   Basic Metabolic Panel: Recent Labs  Lab 10/01/20 0448 10/03/20 0526 10/05/20 0650  NA 150* 152* 152*  K 4.0 5.0 4.2  CL 106 104 104  CO2 36* 38* 34*  GLUCOSE 226* 177* 212*  BUN 51* 56* 58*  CREATININE 0.38* 0.42* 0.42*  CALCIUM 8.2* 8.4* 8.8*  MG  --  2.7*  --     ABG: No results for input(s): PHART, PCO2ART, PO2ART, HCO3, O2SAT in the last 168 hours.  Liver Function Tests: No results for input(s): AST, ALT, ALKPHOS, BILITOT, PROT, ALBUMIN in the last 168 hours. No results for input(s): LIPASE, AMYLASE in the last 168 hours. No results for input(s): AMMONIA in the last 168 hours.  CBC: Recent Labs  Lab 10/01/20 0448 10/03/20 0526  WBC 9.1 10.2  HGB 7.1* 8.6*  HCT 25.4* 29.2*  MCV 117.6* 118.2*  PLT 134* 117*    Cardiac Enzymes: No results for input(s): CKTOTAL, CKMB, CKMBINDEX, TROPONINI in the last 168 hours.  BNP (last 3  results) Recent Labs    09/09/20 0623  BNP 137.4*    ProBNP (last 3 results) No results for input(s): PROBNP in the last 8760 hours.  Radiological Exams: No results found.  Assessment/Plan Active Problems:   Acute on chronic respiratory failure with hypoxia ([place])   COVID-19 virus infection   Pneumonia due to COVID-19 virus   History of pulmonary embolism   Severe sepsis ([place])   1. Acute on chronic respiratory failure hypoxia we will continue with weaning on pressure support titrate oxygen as tolerated. 2. COVID-19 virus infection recovery 3. Pneumonia due to COVID-19 treated 4. History of pulmonary embolism at baseline 5. Severe sepsis resolved   I have personally seen and evaluated the patient, evaluated laboratory and imaging results, formulated the assessment and plan and placed orders. The Patient requires high complexity decision making with multiple systems involvement.  Rounds were done with the Respiratory Therapy Director and Staff therapists and discussed with nursing staff also.  [name], MD [facility] Pulmonary Critical Care Medicine Sleep Medicine

## 2020-10-06 ENCOUNTER — Other Ambulatory Visit (HOSPITAL_COMMUNITY): Payer: Medicare Other

## 2020-10-06 DIAGNOSIS — Z86711 Personal history of pulmonary embolism: Secondary | ICD-10-CM | POA: Diagnosis not present

## 2020-10-06 DIAGNOSIS — U071 COVID-19: Secondary | ICD-10-CM | POA: Diagnosis not present

## 2020-10-06 DIAGNOSIS — A419 Sepsis, unspecified organism: Secondary | ICD-10-CM | POA: Diagnosis not present

## 2020-10-06 DIAGNOSIS — J9621 Acute and chronic respiratory failure with hypoxia: Secondary | ICD-10-CM | POA: Diagnosis not present

## 2020-10-06 NOTE — Progress Notes (Signed)
Pulmonary Critical Care Medicine [place]   PULMONARY CRITICAL CARE SERVICE  PROGRESS NOTE  Date of Service: 10/06/2020  MAR ZETTLER  [id number]  DOB: Mar 04, 1956   DOA: 09/02/2020  Referring Physician: Merton Border, MD  HPI: NASIYAH LAVERDIERE is a 64 y.o. female seen for follow up of Acute on Chronic Respiratory Failure.  Patient remains on the ventilator this morning and has been on weaning on protocol today supposed to try T collar  Medications: Reviewed on Rounds  Physical Exam:  Vitals: Temperature is 96.1 pulse 85 respiratory rate 22 blood pressure is 136/70 saturations 100%  Ventilator Settings currently on assist control FiO2 40% tidal volume 440 PEEP 7  . General: Comfortable at this time . Eyes: Grossly normal lids, irises & conjunctiva . ENT: grossly tongue is normal . Neck: no obvious mass . Cardiovascular: S1 S2 normal no gallop . Respiratory: No rhonchi very coarse breath sounds . Abdomen: soft . Skin: no rash seen on limited exam . Musculoskeletal: not rigid . Psychiatric:unable to assess . Neurologic: no seizure no involuntary movements         Lab Data:   Basic Metabolic Panel: Recent Labs  Lab 10/01/20 0448 10/03/20 0526 10/05/20 0650  NA 150* 152* 152*  K 4.0 5.0 4.2  CL 106 104 104  CO2 36* 38* 34*  GLUCOSE 226* 177* 212*  BUN 51* 56* 58*  CREATININE 0.38* 0.42* 0.42*  CALCIUM 8.2* 8.4* 8.8*  MG  --  2.7*  --     ABG: No results for input(s): PHART, PCO2ART, PO2ART, HCO3, O2SAT in the last 168 hours.  Liver Function Tests: No results for input(s): AST, ALT, ALKPHOS, BILITOT, PROT, ALBUMIN in the last 168 hours. No results for input(s): LIPASE, AMYLASE in the last 168 hours. No results for input(s): AMMONIA in the last 168 hours.  CBC: Recent Labs  Lab 10/01/20 0448 10/03/20 0526  WBC 9.1 10.2  HGB 7.1* 8.6*  HCT 25.4* 29.2*  MCV 117.6* 118.2*  PLT 134* 117*    Cardiac Enzymes: No results for  input(s): CKTOTAL, CKMB, CKMBINDEX, TROPONINI in the last 168 hours.  BNP (last 3 results) Recent Labs    09/09/20 0623  BNP 137.4*    ProBNP (last 3 results) No results for input(s): PROBNP in the last 8760 hours.  Radiological Exams: DG Chest Port 1 View  Result Date: 10/06/2020 CLINICAL DATA:  Pulmonary edema.  Ventilator dependence. EXAM: PORTABLE CHEST 1 VIEW COMPARISON:  10/02/2020 FINDINGS: 0618 hours. Low lung volumes. The cardio pericardial silhouette is enlarged. Diffuse bilateral airspace disease is similar to prior. No substantial pleural effusion. Telemetry leads overlie the chest. Tracheostomy tube again noted. IMPRESSION: Stable exam. Cardiomegaly with bilateral airspace disease suggesting pulmonary edema. Electronically Signed   By: Misty Stanley M.D.   On: 10/06/2020 06:32    Assessment/Plan Active Problems:   Acute on chronic respiratory failure with hypoxia ([place])   COVID-19 virus infection   Pneumonia due to COVID-19 virus   History of pulmonary embolism   Severe sepsis ([place])   1. Acute on chronic respiratory failure hypoxia we will continue with on full support on the ventilator at this time. 2. COVID-19 virus infection recovery 3. Pneumonia due to COVID-19 treated 4. History of pulmonary embolism treated with improvement 5. Severe sepsis resolved we will continue to follow along   I have personally seen and evaluated the patient, evaluated laboratory and imaging results, formulated the assessment and plan and placed orders. The  Patient requires high complexity decision making with multiple systems involvement.  Rounds were done with the Respiratory Therapy Director and Staff therapists and discussed with nursing staff also.  [name], MD [facility] Pulmonary Critical Care Medicine Sleep Medicine

## 2020-10-07 DIAGNOSIS — A419 Sepsis, unspecified organism: Secondary | ICD-10-CM | POA: Diagnosis not present

## 2020-10-07 DIAGNOSIS — Z86711 Personal history of pulmonary embolism: Secondary | ICD-10-CM | POA: Diagnosis not present

## 2020-10-07 DIAGNOSIS — U071 COVID-19: Secondary | ICD-10-CM | POA: Diagnosis not present

## 2020-10-07 DIAGNOSIS — J9621 Acute and chronic respiratory failure with hypoxia: Secondary | ICD-10-CM | POA: Diagnosis not present

## 2020-10-07 LAB — BASIC METABOLIC PANEL
Anion gap: 12 (ref 5–15)
BUN: 58 mg/dL — ABNORMAL HIGH (ref 8–23)
CO2: 36 mmol/L — ABNORMAL HIGH (ref 22–32)
Calcium: 8.6 mg/dL — ABNORMAL LOW (ref 8.9–10.3)
Chloride: 103 mmol/L (ref 98–111)
Creatinine, Ser: 0.48 mg/dL (ref 0.44–1.00)
GFR, Estimated: >60 mL/min (ref >60)
Glucose, Bld: 97 mg/dL (ref 70–99)
Potassium: 3.7 mmol/L (ref 3.5–5.1)
Sodium: 151 mmol/L — ABNORMAL HIGH (ref 135–145)

## 2020-10-07 LAB — CBC
HCT: 22.3 % — ABNORMAL LOW (ref 36.0–46.0)
HCT: 26 % — ABNORMAL LOW (ref 36.0–46.0)
Hemoglobin: 6 g/dL — CL (ref 12.0–15.0)
Hemoglobin: 7.8 g/dL — ABNORMAL LOW (ref 12.0–15.0)
MCH: 32.1 pg (ref 26.0–34.0)
MCH: 32.1 pg (ref 26.0–34.0)
MCHC: 26.9 g/dL — ABNORMAL LOW (ref 30.0–36.0)
MCHC: 30 g/dL (ref 30.0–36.0)
MCV: 119.3 fL — ABNORMAL HIGH (ref 80.0–100.0)
MCV: 107 fL — ABNORMAL HIGH (ref 80.0–100.0)
Platelets: 120 10*3/uL — ABNORMAL LOW (ref 150–400)
Platelets: 130 10*3/uL — ABNORMAL LOW (ref 150–400)
RBC: 1.87 MIL/uL — ABNORMAL LOW (ref 3.87–5.11)
RBC: 2.43 MIL/uL — ABNORMAL LOW (ref 3.87–5.11)
RDW: 19.3 % — ABNORMAL HIGH (ref 11.5–15.5)
WBC: 7.8 10*3/uL (ref 4.0–10.5)
WBC: 7.5 10*3/uL (ref 4.0–10.5)
nRBC: 1.1 % — ABNORMAL HIGH (ref 0.0–0.2)
nRBC: 1.1 % — ABNORMAL HIGH (ref 0.0–0.2)

## 2020-10-07 LAB — PREPARE RBC (CROSSMATCH)

## 2020-10-07 NOTE — Progress Notes (Signed)
Pulmonary Critical Care Medicine [name]   PULMONARY CRITICAL CARE SERVICE  PROGRESS NOTE  Date of Service: 10/07/2020  SIBLEY ROLISON  [id number]:[id number]  DOB: [birth year]   DOA: 09/02/2020  Referring Physician: Merton Border, MD  HPI: Marie Chavez is a 64 y.o. female seen for follow up of Acute on Chronic Respiratory Failure.  Patient currently is on T collar has been on 45% FiO2 the goal is for 8 hours.  She has been apparently suicidal has required one-on-one nursing because of this  Medications: Reviewed on Rounds  Physical Exam:  Vitals: Temperature 97.6 pulse 89 respiratory rate 16 blood pressure is 106/49 saturations 99%  Ventilator Settings on T collar with an FiO2 of 45%   General: Comfortable at this time  Eyes: Grossly normal lids, irises & conjunctiva  ENT: grossly tongue is normal  Neck: no obvious mass  Cardiovascular: S1 S2 normal no gallop  Respiratory: No rhonchi no rales are noted at this time  Abdomen: soft  Skin: no rash seen on limited exam  Musculoskeletal: not rigid  Psychiatric:unable to assess  Neurologic: no seizure no involuntary movements         Lab Data:   Basic Metabolic Panel: Recent Labs  Lab 10/01/20 0448 10/03/20 0526 10/05/20 0650 10/07/20 0515  NA 150* 152* 152* 151*  K 4.0 5.0 4.2 3.7  CL 106 104 104 103  CO2 36* 38* 34* 36*  GLUCOSE 226* 177* 212* 97  BUN 51* 56* 58* 58*  CREATININE 0.38* 0.42* 0.42* 0.48  CALCIUM 8.2* 8.4* 8.8* 8.6*  MG  --  2.7*  --   --     ABG: No results for input(s): PHART, PCO2ART, PO2ART, HCO3, O2SAT in the last 168 hours.  Liver Function Tests: No results for input(s): AST, ALT, ALKPHOS, BILITOT, PROT, ALBUMIN in the last 168 hours. No results for input(s): LIPASE, AMYLASE in the last 168 hours. No results for input(s): AMMONIA in the last 168 hours.  CBC: Recent Labs  Lab 10/01/20 0448 10/03/20 0526 10/07/20 0515 10/07/20 1336  WBC 9.1 10.2 7.8 7.5   HGB 7.1* 8.6* 6.0* 7.8*  HCT 25.4* 29.2* 22.3* 26.0*  MCV 117.6* 118.2* 119.3* 107.0*  PLT 134* 117* 120* 130*    Cardiac Enzymes: No results for input(s): CKTOTAL, CKMB, CKMBINDEX, TROPONINI in the last 168 hours.  BNP (last 3 results) Recent Labs    09/09/20 0623  BNP 137.4*    ProBNP (last 3 results) No results for input(s): PROBNP in the last 8760 hours.  Radiological Exams: DG Chest Port 1 View  Result Date: 10/06/2020 CLINICAL DATA:  Pulmonary edema.  Ventilator dependence. EXAM: PORTABLE CHEST 1 VIEW COMPARISON:  10/02/2020 FINDINGS: 0618 hours. Low lung volumes. The cardio pericardial silhouette is enlarged. Diffuse bilateral airspace disease is similar to prior. No substantial pleural effusion. Telemetry leads overlie the chest. Tracheostomy tube again noted. IMPRESSION: Stable exam. Cardiomegaly with bilateral airspace disease suggesting pulmonary edema. Electronically Signed   By: Misty Stanley M.D.   On: 10/06/2020 06:32    Assessment/Plan Active Problems:   Acute on chronic respiratory failure with hypoxia ([place])   COVID-19 virus infection   Pneumonia due to COVID-19 virus   History of pulmonary embolism   Severe sepsis ([name])   1. Acute on chronic respiratory failure with hypoxia we will continue with the T collar titrate oxygen continue pulmonary toilet. 2. COVID-19 virus infection in recovery phase 3. Pneumonia due to COVID-19 slow improvement stable exam on  the last chest x-ray. 4. Pulmonary embolism treated 5. Severe sepsis resolving   I have personally seen and evaluated the patient, evaluated laboratory and imaging results, formulated the assessment and plan and placed orders. The Patient requires high complexity decision making with multiple systems involvement.  Rounds were done with the Respiratory Therapy Director and Staff therapists and discussed with nursing staff also.  [name], MD [facility] Pulmonary Critical Care Medicine Sleep  Medicine

## 2020-10-08 ENCOUNTER — Other Ambulatory Visit (HOSPITAL_COMMUNITY): Payer: Medicare Other

## 2020-10-08 DIAGNOSIS — J9621 Acute and chronic respiratory failure with hypoxia: Secondary | ICD-10-CM | POA: Diagnosis not present

## 2020-10-08 DIAGNOSIS — Z86711 Personal history of pulmonary embolism: Secondary | ICD-10-CM | POA: Diagnosis not present

## 2020-10-08 DIAGNOSIS — A419 Sepsis, unspecified organism: Secondary | ICD-10-CM | POA: Diagnosis not present

## 2020-10-08 DIAGNOSIS — U071 COVID-19: Secondary | ICD-10-CM | POA: Diagnosis not present

## 2020-10-08 LAB — TYPE AND SCREEN
ABO/RH(D): A POS
Antibody Screen: NEGATIVE
Unit division: 0

## 2020-10-08 LAB — BPAM RBC
Blood Product Expiration Date: 202201102359
ISSUE DATE / TIME: 202112140945
Unit Type and Rh: 6200

## 2020-10-08 LAB — HEPARIN INDUCED PLATELET AB (HIT ANTIBODY): Heparin Induced Plt Ab: 0.094 OD (ref 0.000–0.400)

## 2020-10-08 LAB — OCCULT BLOOD X 1 CARD TO LAB, STOOL: Fecal Occult Bld: POSITIVE — AB

## 2020-10-09 DIAGNOSIS — Z86711 Personal history of pulmonary embolism: Secondary | ICD-10-CM | POA: Diagnosis not present

## 2020-10-09 DIAGNOSIS — A419 Sepsis, unspecified organism: Secondary | ICD-10-CM | POA: Diagnosis not present

## 2020-10-09 DIAGNOSIS — U071 COVID-19: Secondary | ICD-10-CM | POA: Diagnosis not present

## 2020-10-09 DIAGNOSIS — J9621 Acute and chronic respiratory failure with hypoxia: Secondary | ICD-10-CM | POA: Diagnosis not present

## 2020-10-09 NOTE — Progress Notes (Signed)
Pulmonary Critical Care Medicine [place]   PULMONARY CRITICAL CARE SERVICE  PROGRESS NOTE  Date of Service: 10/08/2020  Marie Chavez  ZHY:[id number]  DOB: 26-Oct-1955   DOA: 09/02/2020  Referring Physician: Merton Border, MD  HPI: Marie Chavez is a 64 y.o. female seen for follow up of Acute on Chronic Respiratory Failure.  Patient currently is on pressure support has been on 40% FiO2 12/5 try to advance as tolerated.  Medications: Reviewed on Rounds  Physical Exam:  Vitals: Temperature is 97.4 pulse 80 respiratory 16 blood pressure is 126/61 saturations 100%  Ventilator Settings on pressure support FiO2 is 40% pressure of 12/5  . General: Comfortable at this time . Eyes: Grossly normal lids, irises & conjunctiva . ENT: grossly tongue is normal . Neck: no obvious mass . Cardiovascular: S1 S2 normal no gallop . Respiratory: No rhonchi very coarse breath sound . Abdomen: soft . Skin: no rash seen on limited exam . Musculoskeletal: not rigid . Psychiatric:unable to assess . Neurologic: no seizure no involuntary movements         Lab Data:   Basic Metabolic Panel: Recent Labs  Lab 10/03/20 0526 10/05/20 0650 10/07/20 0515  NA 152* 152* 151*  K 5.0 4.2 3.7  CL 104 104 103  CO2 38* 34* 36*  GLUCOSE 177* 212* 97  BUN 56* 58* 58*  CREATININE 0.42* 0.42* 0.48  CALCIUM 8.4* 8.8* 8.6*  MG 2.7*  --   --     ABG: No results for input(s): PHART, PCO2ART, PO2ART, HCO3, O2SAT in the last 168 hours.  Liver Function Tests: No results for input(s): AST, ALT, ALKPHOS, BILITOT, PROT, ALBUMIN in the last 168 hours. No results for input(s): LIPASE, AMYLASE in the last 168 hours. No results for input(s): AMMONIA in the last 168 hours.  CBC: Recent Labs  Lab 10/03/20 0526 10/07/20 0515 10/07/20 1336  WBC 10.2 7.8 7.5  HGB 8.6* 6.0* 7.8*  HCT 29.2* 22.3* 26.0*  MCV 118.2* 119.3* 107.0*  PLT 117* 120* 130*    Cardiac Enzymes: No results for  input(s): CKTOTAL, CKMB, CKMBINDEX, TROPONINI in the last 168 hours.  BNP (last 3 results) Recent Labs    09/09/20 0623  BNP 137.4*    ProBNP (last 3 results) No results for input(s): PROBNP in the last 8760 hours.  Radiological Exams: No results found.  Assessment/Plan Active Problems:   Acute on chronic respiratory failure with hypoxia ([place])   COVID-19 virus infection   Pneumonia due to COVID-19 virus   History of pulmonary embolism   Severe sepsis ([place])   1. Acute on chronic respiratory failure hypoxia we will continue with pressure support as tolerated. 2. COVID-19 virus infection recovery 3. Pneumonia due to COVID-19 treated we will monitor 4. History of pulmonary embolism treated 5. Severe sepsis resolved   I have personally seen and evaluated the patient, evaluated laboratory and imaging results, formulated the assessment and plan and placed orders. The Patient requires high complexity decision making with multiple systems involvement.  Rounds were done with the Respiratory Therapy Director and Staff therapists and discussed with nursing staff also.  [name], MD [facility] Pulmonary Critical Care Medicine Sleep Medicine

## 2020-10-09 NOTE — Progress Notes (Signed)
Pulmonary Critical Care Medicine [place]   PULMONARY CRITICAL CARE SERVICE  PROGRESS NOTE  Date of Service: 10/09/2020  Marie Chavez  EHU:[id number]  DOB: 1955-11-08   DOA: 09/02/2020  Referring Physician: Merton Border, MD  HPI: Marie Chavez is a 64 y.o. female seen for follow up of Acute on Chronic Respiratory Failure.  Right now is on T collar is on 40% FiO2 with a goal of 16 hours  Medications: Reviewed on Rounds  Physical Exam:  Vitals: Temperature is 97.5 pulse 78 respiratory 15 blood pressure is 111/63 saturations 100%  Ventilator Settings on T collar with an FiO2 of 40%  . General: Comfortable at this time . Eyes: Grossly normal lids, irises & conjunctiva . ENT: grossly tongue is normal . Neck: no obvious mass . Cardiovascular: S1 S2 normal no gallop . Respiratory: No rhonchi very coarse breath sounds are noted . Abdomen: soft . Skin: no rash seen on limited exam . Musculoskeletal: not rigid . Psychiatric:unable to assess . Neurologic: no seizure no involuntary movements         Lab Data:   Basic Metabolic Panel: Recent Labs  Lab 10/03/20 0526 10/05/20 0650 10/07/20 0515  NA 152* 152* 151*  K 5.0 4.2 3.7  CL 104 104 103  CO2 38* 34* 36*  GLUCOSE 177* 212* 97  BUN 56* 58* 58*  CREATININE 0.42* 0.42* 0.48  CALCIUM 8.4* 8.8* 8.6*  MG 2.7*  --   --     ABG: No results for input(s): PHART, PCO2ART, PO2ART, HCO3, O2SAT in the last 168 hours.  Liver Function Tests: No results for input(s): AST, ALT, ALKPHOS, BILITOT, PROT, ALBUMIN in the last 168 hours. No results for input(s): LIPASE, AMYLASE in the last 168 hours. No results for input(s): AMMONIA in the last 168 hours.  CBC: Recent Labs  Lab 10/03/20 0526 10/07/20 0515 10/07/20 1336  WBC 10.2 7.8 7.5  HGB 8.6* 6.0* 7.8*  HCT 29.2* 22.3* 26.0*  MCV 118.2* 119.3* 107.0*  PLT 117* 120* 130*    Cardiac Enzymes: No results for input(s): CKTOTAL, CKMB, CKMBINDEX,  TROPONINI in the last 168 hours.  BNP (last 3 results) Recent Labs    09/09/20 0623  BNP 137.4*    ProBNP (last 3 results) No results for input(s): PROBNP in the last 8760 hours.  Radiological Exams: No results found.  Assessment/Plan Active Problems:   Acute on chronic respiratory failure with hypoxia ([place])   COVID-19 virus infection   Pneumonia due to COVID-19 virus   History of pulmonary embolism   Severe sepsis ([place])   1. Acute on chronic respiratory failure hypoxia plan is to continue to wean on T collar as well as for 16 hours.  Continue pulmonary toilet supportive care 2. COVID-19 virus infection recovery 3. Pneumonia due to COVID-19 treated 4. Pulmonary embolism patient is at baseline right now treated with anticoagulation 5. Severe sepsis resolved   I have personally seen and evaluated the patient, evaluated laboratory and imaging results, formulated the assessment and plan and placed orders. The Patient requires high complexity decision making with multiple systems involvement.  Rounds were done with the Respiratory Therapy Director and Staff therapists and discussed with nursing staff also.  [name], MD [facility] Pulmonary Critical Care Medicine Sleep Medicine

## 2020-10-10 ENCOUNTER — Other Ambulatory Visit (HOSPITAL_COMMUNITY): Payer: Medicare Other

## 2020-10-10 DIAGNOSIS — A419 Sepsis, unspecified organism: Secondary | ICD-10-CM | POA: Diagnosis not present

## 2020-10-10 DIAGNOSIS — U071 COVID-19: Secondary | ICD-10-CM | POA: Diagnosis not present

## 2020-10-10 DIAGNOSIS — Z86711 Personal history of pulmonary embolism: Secondary | ICD-10-CM | POA: Diagnosis not present

## 2020-10-10 DIAGNOSIS — J9621 Acute and chronic respiratory failure with hypoxia: Secondary | ICD-10-CM | POA: Diagnosis not present

## 2020-10-10 LAB — BLOOD GAS, ARTERIAL
Acid-Base Excess: 9.8 mmol/L — ABNORMAL HIGH (ref 0.0–2.0)
Bicarbonate: 34.7 mmol/L — ABNORMAL HIGH (ref 20.0–28.0)
FIO2: 40
O2 Saturation: 92.4 %
Patient temperature: 36.5
pCO2 arterial: 54.8 mmHg — ABNORMAL HIGH (ref 32.0–48.0)
pH, Arterial: 7.416 (ref 7.350–7.450)
pO2, Arterial: 60.9 mmHg — ABNORMAL LOW (ref 83.0–108.0)

## 2020-10-10 NOTE — Progress Notes (Signed)
Pulmonary Critical Care Medicine [place]   PULMONARY CRITICAL CARE SERVICE  PROGRESS NOTE  Date of Service: 10/10/2020  LOTTIE SIGMAN  MEB:[id number]  DOB: 12/13/1955   DOA: 09/02/2020  Referring Physician: Merton Border, MD  HPI: Marie Chavez is a 64 y.o. female seen for follow up of Acute on Chronic Respiratory Failure.  Patient currently is on T collar has been on 40% FiO2 with a goal of 24 hours today  Medications: Reviewed on Rounds  Physical Exam:  Vitals: Temperature is 97.7 pulse 80 respiratory rate 17 blood pressure is 143/71 saturations 97%  Ventilator Settings on T collar with an FiO2 40%  . General: Comfortable at this time . Eyes: Grossly normal lids, irises & conjunctiva . ENT: grossly tongue is normal . Neck: no obvious mass . Cardiovascular: S1 S2 normal no gallop . Respiratory: No rhonchi very coarse breath sounds . Abdomen: soft . Skin: no rash seen on limited exam . Musculoskeletal: not rigid . Psychiatric:unable to assess . Neurologic: no seizure no involuntary movements         Lab Data:   Basic Metabolic Panel: Recent Labs  Lab 10/05/20 0650 10/07/20 0515  NA 152* 151*  K 4.2 3.7  CL 104 103  CO2 34* 36*  GLUCOSE 212* 97  BUN 58* 58*  CREATININE 0.42* 0.48  CALCIUM 8.8* 8.6*    ABG: No results for input(s): PHART, PCO2ART, PO2ART, HCO3, O2SAT in the last 168 hours.  Liver Function Tests: No results for input(s): AST, ALT, ALKPHOS, BILITOT, PROT, ALBUMIN in the last 168 hours. No results for input(s): LIPASE, AMYLASE in the last 168 hours. No results for input(s): AMMONIA in the last 168 hours.  CBC: Recent Labs  Lab 10/07/20 0515 10/07/20 1336  WBC 7.8 7.5  HGB 6.0* 7.8*  HCT 22.3* 26.0*  MCV 119.3* 107.0*  PLT 120* 130*    Cardiac Enzymes: No results for input(s): CKTOTAL, CKMB, CKMBINDEX, TROPONINI in the last 168 hours.  BNP (last 3 results) Recent Labs    09/09/20 0623  BNP 137.4*     ProBNP (last 3 results) No results for input(s): PROBNP in the last 8760 hours.  Radiological Exams: No results found.  Assessment/Plan Active Problems:   Acute on chronic respiratory failure with hypoxia ([place])   COVID-19 virus infection   Pneumonia due to COVID-19 virus   History of pulmonary embolism   Severe sepsis ([place])   1. Acute on chronic respiratory failure hypoxia we will continue with T collar trials 24-hour goal 2. COVID-19 virus infection in recovery phase we will continue to follow. 3. Pneumonia due to COVID-19 treated slowly improving. 4. History of pulmonary embolism patient is at baseline 5. Severe sepsis resolved   I have personally seen and evaluated the patient, evaluated laboratory and imaging results, formulated the assessment and plan and placed orders. The Patient requires high complexity decision making with multiple systems involvement.  Rounds were done with the Respiratory Therapy Director and Staff therapists and discussed with nursing staff also.  [name], MD [facility] Pulmonary Critical Care Medicine Sleep Medicine

## 2020-10-11 DIAGNOSIS — U071 COVID-19: Secondary | ICD-10-CM | POA: Diagnosis not present

## 2020-10-11 DIAGNOSIS — J9621 Acute and chronic respiratory failure with hypoxia: Secondary | ICD-10-CM | POA: Diagnosis not present

## 2020-10-11 DIAGNOSIS — Z86711 Personal history of pulmonary embolism: Secondary | ICD-10-CM | POA: Diagnosis not present

## 2020-10-11 DIAGNOSIS — A419 Sepsis, unspecified organism: Secondary | ICD-10-CM | POA: Diagnosis not present

## 2020-10-11 LAB — CBC
HCT: 26.4 % — ABNORMAL LOW (ref 36.0–46.0)
Hemoglobin: 7.4 g/dL — ABNORMAL LOW (ref 12.0–15.0)
MCH: 30.3 pg (ref 26.0–34.0)
MCHC: 28 g/dL — ABNORMAL LOW (ref 30.0–36.0)
MCV: 108.2 fL — ABNORMAL HIGH (ref 80.0–100.0)
Platelets: 135 10*3/uL — ABNORMAL LOW (ref 150–400)
RBC: 2.44 MIL/uL — ABNORMAL LOW (ref 3.87–5.11)
RDW: 21.4 % — ABNORMAL HIGH (ref 11.5–15.5)
WBC: 6.7 10*3/uL (ref 4.0–10.5)
nRBC: 0.8 % — ABNORMAL HIGH (ref 0.0–0.2)

## 2020-10-11 LAB — BASIC METABOLIC PANEL
Anion gap: 8 (ref 5–15)
BUN: 38 mg/dL — ABNORMAL HIGH (ref 8–23)
CO2: 34 mmol/L — ABNORMAL HIGH (ref 22–32)
Calcium: 8.4 mg/dL — ABNORMAL LOW (ref 8.9–10.3)
Chloride: 100 mmol/L (ref 98–111)
Creatinine, Ser: 0.4 mg/dL — ABNORMAL LOW (ref 0.44–1.00)
GFR, Estimated: >60 mL/min (ref >60)
Glucose, Bld: 248 mg/dL — ABNORMAL HIGH (ref 70–99)
Potassium: 3.5 mmol/L (ref 3.5–5.1)
Sodium: 142 mmol/L (ref 135–145)

## 2020-10-11 NOTE — Progress Notes (Signed)
Pulmonary Critical Care Medicine [place]   PULMONARY CRITICAL CARE SERVICE  PROGRESS NOTE  Date of Service: 10/11/2020  [name]  KWI:[id number]  DOB: [birth year]   DOA: 09/02/2020  Referring Physician: Merton Border, MD  HPI: Marie Chavez is a 64 y.o. female seen for follow up of Acute on Chronic Respiratory Failure.  Patient remains on T collar has been on 40% FiO2 with a goal of 48 hours  Medications: Reviewed on Rounds  Physical Exam:  Vitals: Temperature is 97.5 pulse 84 respiratory rate is 18 blood pressure is 152/74 saturations 98  Ventilator Settings on T collar with an FiO2 of 40%   General: Comfortable at this time  Eyes: Grossly normal lids, irises & conjunctiva  ENT: grossly tongue is normal  Neck: no obvious mass  Cardiovascular: S1 S2 normal no gallop  Respiratory: No rhonchi very coarse breath  Abdomen: soft  Skin: no rash seen on limited exam  Musculoskeletal: not rigid  Psychiatric:unable to assess  Neurologic: no seizure no involuntary movements         Lab Data:   Basic Metabolic Panel: Recent Labs  Lab 10/05/20 0650 10/07/20 0515 10/11/20 0416  NA 152* 151* 142  K 4.2 3.7 3.5  CL 104 103 100  CO2 34* 36* 34*  GLUCOSE 212* 97 248*  BUN 58* 58* 38*  CREATININE 0.42* 0.48 0.40*  CALCIUM 8.8* 8.6* 8.4*    ABG: Recent Labs  Lab 10/10/20 1757  PHART 7.416  PCO2ART 54.8*  PO2ART 60.9*  HCO3 34.7*  O2SAT 92.4    Liver Function Tests: No results for input(s): AST, ALT, ALKPHOS, BILITOT, PROT, ALBUMIN in the last 168 hours. No results for input(s): LIPASE, AMYLASE in the last 168 hours. No results for input(s): AMMONIA in the last 168 hours.  CBC: Recent Labs  Lab 10/07/20 0515 10/07/20 1336 10/11/20 0416  WBC 7.8 7.5 6.7  HGB 6.0* 7.8* 7.4*  HCT 22.3* 26.0* 26.4*  MCV 119.3* 107.0* 108.2*  PLT 120* 130* 135*    Cardiac Enzymes: No results for input(s): CKTOTAL, CKMB, CKMBINDEX,  TROPONINI in the last 168 hours.  BNP (last 3 results) Recent Labs    09/09/20 0623  BNP 137.4*    ProBNP (last 3 results) No results for input(s): PROBNP in the last 8760 hours.  Radiological Exams: DG CHEST PORT 1 VIEW  Result Date: 10/10/2020 CLINICAL DATA:  Congestive heart failure EXAM: PORTABLE CHEST 1 VIEW COMPARISON:  10/06/2020 FINDINGS: Tracheostomy tube remains in place. Stable cardiomegaly. Mild diffuse interstitial prominence throughout both lungs. Slightly more patchy airspace opacities within the bibasilar regions. No large pleural fluid collection. No pneumothorax. IMPRESSION: Cardiomegaly with mild diffuse interstitial prominence throughout both lungs suggesting CHF with pulmonary edema. Electronically Signed   By: Davina Poke D.O.   On: 10/10/2020 11:31    Assessment/Plan Active Problems:   Acute on chronic respiratory failure with hypoxia ([address])   COVID-19 virus infection   Pneumonia due to COVID-19 virus   History of pulmonary embolism   Severe sepsis ([place])   1. Acute on chronic respiratory failure hypoxia we will continue T collar titrate oxygen continue pulmonary toilet. 2. COVID-19 virus infection in recovery 3. Pneumonia due to COVID-19 treated slow improvement 4. Pulmonary embolism treated we will continue to monitor 5. Severe sepsis resolved   I have personally seen and evaluated the patient, evaluated laboratory and imaging results, formulated the assessment and plan and placed orders. The Patient requires high complexity decision  making with multiple systems involvement.  Rounds were done with the Respiratory Therapy Director and Staff therapists and discussed with nursing staff also.  [name], MD [facility] Pulmonary Critical Care Medicine Sleep Medicine

## 2020-10-12 ENCOUNTER — Other Ambulatory Visit (HOSPITAL_COMMUNITY): Payer: Medicare Other

## 2020-10-12 DIAGNOSIS — A419 Sepsis, unspecified organism: Secondary | ICD-10-CM | POA: Diagnosis not present

## 2020-10-12 DIAGNOSIS — J9621 Acute and chronic respiratory failure with hypoxia: Secondary | ICD-10-CM | POA: Diagnosis not present

## 2020-10-12 DIAGNOSIS — Z86711 Personal history of pulmonary embolism: Secondary | ICD-10-CM | POA: Diagnosis not present

## 2020-10-12 DIAGNOSIS — U071 COVID-19: Secondary | ICD-10-CM | POA: Diagnosis not present

## 2020-10-12 LAB — BLOOD GAS, ARTERIAL
Acid-Base Excess: 9.2 mmol/L — ABNORMAL HIGH (ref 0.0–2.0)
Bicarbonate: 34.7 mmol/L — ABNORMAL HIGH (ref 20.0–28.0)
FIO2: 40
O2 Saturation: 85 %
Patient temperature: 36.1
pCO2 arterial: 59.9 mmHg — ABNORMAL HIGH (ref 32.0–48.0)
pH, Arterial: 7.376 (ref 7.350–7.450)
pO2, Arterial: 48.8 mmHg — ABNORMAL LOW (ref 83.0–108.0)

## 2020-10-12 NOTE — Progress Notes (Signed)
Pulmonary Critical Care Medicine [place]   PULMONARY CRITICAL CARE SERVICE  PROGRESS NOTE  Date of Service: 10/12/2020  Marie Chavez  [id number]  DOB: 05/07/1956   DOA: 09/02/2020  Referring Physician: Merton Border, MD  HPI: Marie Chavez is a 64 y.o. female seen for follow up of Acute on Chronic Respiratory Failure.  Patient currently is on assist control mode on 60% FiO2 tidal volumes at 440 with a PEEP of 7  Medications: Reviewed on Rounds  Physical Exam:  Vitals: Temperature is 96.7 pulse 84 respiratory 21 blood pressure is 154/82 saturations 92%  Ventilator Settings assist control FiO2 60% tidal volume 440 PEEP 7  . General: Comfortable at this time . Eyes: Grossly normal lids, irises & conjunctiva . ENT: grossly tongue is normal . Neck: no obvious mass . Cardiovascular: S1 S2 normal no gallop . Respiratory: No rhonchi very coarse breath sounds . Abdomen: soft . Skin: no rash seen on limited exam . Musculoskeletal: not rigid . Psychiatric:unable to assess . Neurologic: no seizure no involuntary movements         Lab Data:   Basic Metabolic Panel: Recent Labs  Lab 10/07/20 0515 10/11/20 0416  NA 151* 142  K 3.7 3.5  CL 103 100  CO2 36* 34*  GLUCOSE 97 248*  BUN 58* 38*  CREATININE 0.48 0.40*  CALCIUM 8.6* 8.4*    ABG: Recent Labs  Lab 10/10/20 1757 10/12/20 0710  PHART 7.416 7.376  PCO2ART 54.8* 59.9*  PO2ART 60.9* 48.8*  HCO3 34.7* 34.7*  O2SAT 92.4 85.0    Liver Function Tests: No results for input(s): AST, ALT, ALKPHOS, BILITOT, PROT, ALBUMIN in the last 168 hours. No results for input(s): LIPASE, AMYLASE in the last 168 hours. No results for input(s): AMMONIA in the last 168 hours.  CBC: Recent Labs  Lab 10/07/20 0515 10/07/20 1336 10/11/20 0416  WBC 7.8 7.5 6.7  HGB 6.0* 7.8* 7.4*  HCT 22.3* 26.0* 26.4*  MCV 119.3* 107.0* 108.2*  PLT 120* 130* 135*    Cardiac Enzymes: No results for input(s):  CKTOTAL, CKMB, CKMBINDEX, TROPONINI in the last 168 hours.  BNP (last 3 results) Recent Labs    09/09/20 0623  BNP 137.4*    ProBNP (last 3 results) No results for input(s): PROBNP in the last 8760 hours.  Radiological Exams: DG CHEST PORT 1 VIEW  Result Date: 10/10/2020 CLINICAL DATA:  Congestive heart failure EXAM: PORTABLE CHEST 1 VIEW COMPARISON:  10/06/2020 FINDINGS: Tracheostomy tube remains in place. Stable cardiomegaly. Mild diffuse interstitial prominence throughout both lungs. Slightly more patchy airspace opacities within the bibasilar regions. No large pleural fluid collection. No pneumothorax. IMPRESSION: Cardiomegaly with mild diffuse interstitial prominence throughout both lungs suggesting CHF with pulmonary edema. Electronically Signed   By: Davina Poke D.O.   On: 10/10/2020 11:31    Assessment/Plan Active Problems:   Acute on chronic respiratory failure with hypoxia ([place])   COVID-19 virus infection   Pneumonia due to COVID-19 virus   History of pulmonary embolism   Severe sepsis ([place])   1. Acute on chronic respiratory failure hypoxia patient had to be placed back on the ventilator and increased work of breathing noted now she appears to be more comfortable.  ABG that was done showed pretty significant hypoxia I have ordered a follow-up chest x-ray to reassess. 2. COVID-19 virus infection in recovery we will continue to monitor. 3. Pneumonia due to COVID-19 treated patient has residual pulmonary damage noted. 4. History of  pulmonary embolism treated with anticoagulation 5. Severe sepsis resolved hemodynamics are stable   I have personally seen and evaluated the patient, evaluated laboratory and imaging results, formulated the assessment and plan and placed orders. The Patient requires high complexity decision making with multiple systems involvement.  Rounds were done with the Respiratory Therapy Director and Staff therapists and discussed with nursing staff  also.  [name], MD [facility] Pulmonary Critical Care Medicine Sleep Medicine

## 2020-10-13 ENCOUNTER — Other Ambulatory Visit (HOSPITAL_COMMUNITY): Payer: Medicare Other

## 2020-10-13 DIAGNOSIS — A419 Sepsis, unspecified organism: Secondary | ICD-10-CM | POA: Diagnosis not present

## 2020-10-13 DIAGNOSIS — U071 COVID-19: Secondary | ICD-10-CM | POA: Diagnosis not present

## 2020-10-13 DIAGNOSIS — J9621 Acute and chronic respiratory failure with hypoxia: Secondary | ICD-10-CM | POA: Diagnosis not present

## 2020-10-13 DIAGNOSIS — Z86711 Personal history of pulmonary embolism: Secondary | ICD-10-CM | POA: Diagnosis not present

## 2020-10-13 LAB — CULTURE, RESPIRATORY W GRAM STAIN

## 2020-10-13 NOTE — Progress Notes (Signed)
Pulmonary Critical Care Medicine [place]   PULMONARY CRITICAL CARE SERVICE  PROGRESS NOTE  Date of Service: 10/13/2020  Marie Chavez  ZOX:[id number]  DOB: 11/30/1955   DOA: 09/02/2020  Referring Physician: Merton Border, MD  HPI: Marie Chavez is a 64 y.o. female seen for follow up of Acute on Chronic Respiratory Failure.  Currently on full support on the ventilator when she developed some pulmonary edema.  Plan: Ventilator rate now on assist control mode  Medications: Reviewed on Rounds  Physical Exam:  Vitals: Temperature is 96.4 pulse 81 respiratory 18 blood pressure is 116/67 saturations 96%  Ventilator Settings on assist control FiO2 55% tidal volume 440 PEEP 7  . General: Comfortable at this time . Eyes: Grossly normal lids, irises & conjunctiva . ENT: grossly tongue is normal . Neck: no obvious mass . Cardiovascular: S1 S2 normal no gallop . Respiratory: No rhonchi rales are noted at this time. . Abdomen: soft . Skin: no rash seen on limited exam . Musculoskeletal: not rigid . Psychiatric:unable to assess . Neurologic: no seizure no involuntary movements         Lab Data:   Basic Metabolic Panel: Recent Labs  Lab 10/07/20 0515 10/11/20 0416  NA 151* 142  K 3.7 3.5  CL 103 100  CO2 36* 34*  GLUCOSE 97 248*  BUN 58* 38*  CREATININE 0.48 0.40*  CALCIUM 8.6* 8.4*    ABG: Recent Labs  Lab 10/10/20 1757 10/12/20 0710  PHART 7.416 7.376  PCO2ART 54.8* 59.9*  PO2ART 60.9* 48.8*  HCO3 34.7* 34.7*  O2SAT 92.4 85.0    Liver Function Tests: No results for input(s): AST, ALT, ALKPHOS, BILITOT, PROT, ALBUMIN in the last 168 hours. No results for input(s): LIPASE, AMYLASE in the last 168 hours. No results for input(s): AMMONIA in the last 168 hours.  CBC: Recent Labs  Lab 10/07/20 0515 10/07/20 1336 10/11/20 0416  WBC 7.8 7.5 6.7  HGB 6.0* 7.8* 7.4*  HCT 22.3* 26.0* 26.4*  MCV 119.3* 107.0* 108.2*  PLT 120* 130* 135*     Cardiac Enzymes: No results for input(s): CKTOTAL, CKMB, CKMBINDEX, TROPONINI in the last 168 hours.  BNP (last 3 results) Recent Labs    09/09/20 0623  BNP 137.4*    ProBNP (last 3 results) No results for input(s): PROBNP in the last 8760 hours.  Radiological Exams: DG Chest Port 1 View  Result Date: 10/12/2020 CLINICAL DATA:  Congestive heart failure. EXAM: PORTABLE CHEST 1 VIEW COMPARISON:  Chest radiograph 10/10/2020. FINDINGS: Monitoring leads overlie the patient. ET tube mid trachea. Cardiac and mediastinal contours largely obscured. Interval worsening diffuse bilateral airspace opacities, particularly on the right. No definite pleural effusion or pneumothorax. IMPRESSION: Interval worsening diffuse bilateral airspace opacities, particularly on the right. Electronically Signed   By: Lovey Newcomer M.D.   On: 10/12/2020 10:48    Assessment/Plan Active Problems:   Acute on chronic respiratory failure with hypoxia ([place])   COVID-19 virus infection   Pneumonia due to COVID-19 virus   History of pulmonary embolism   Severe sepsis ([place])   1. Acute on chronic respiratory failure hypoxia on assist control FiO2 55% tidal volume was 440 PEEP of 7 continue with the ventilator for now. 2. COVID-19 virus infection in recovery we will continue to follow along. 3. Retained COVID-19 treated we will continue with supportive care 4. History of pulmonary embolism at baseline 5. Severe sepsis resolved we will continue to monitor.   I have personally  seen and evaluated the patient, evaluated laboratory and imaging results, formulated the assessment and plan and placed orders. The Patient requires high complexity decision making with multiple systems involvement.  Rounds were done with the Respiratory Therapy Director and Staff therapists and discussed with nursing staff also.  [name], MD [facility] Pulmonary Critical Care Medicine Sleep Medicine

## 2020-10-14 DIAGNOSIS — A419 Sepsis, unspecified organism: Secondary | ICD-10-CM | POA: Diagnosis not present

## 2020-10-14 DIAGNOSIS — J9621 Acute and chronic respiratory failure with hypoxia: Secondary | ICD-10-CM | POA: Diagnosis not present

## 2020-10-14 DIAGNOSIS — U071 COVID-19: Secondary | ICD-10-CM | POA: Diagnosis not present

## 2020-10-14 DIAGNOSIS — Z86711 Personal history of pulmonary embolism: Secondary | ICD-10-CM | POA: Diagnosis not present

## 2020-10-14 NOTE — Progress Notes (Signed)
Pulmonary Critical Care Medicine [place]   PULMONARY CRITICAL CARE SERVICE  PROGRESS NOTE  Date of Service: 10/14/2020  Marie Chavez  [id number]:[id number]  DOB: 1956/03/24   DOA: 09/02/2020  Referring Physician: Merton Border, MD  HPI: Marie Chavez is a 64 y.o. female seen for follow up of Acute on Chronic Respiratory Failure.  Patient currently is on assist control has been on 55% FiO2 with good saturations noted.  Medications: Reviewed on Rounds  Physical Exam:  Vitals: Temperature is 97.7 pulse 77 respiratory 21 blood pressure is 139/74 saturations 100%  Ventilator Settings assist control FiO2 35% tidal volume is 440 PEEP 7   General: Comfortable at this time  Eyes: Grossly normal lids, irises & conjunctiva  ENT: grossly tongue is normal  Neck: no obvious mass  Cardiovascular: S1 S2 normal no gallop  Respiratory: No rhonchi very coarse breath sounds  Abdomen: soft  Skin: no rash seen on limited exam  Musculoskeletal: not rigid  Psychiatric:unable to assess  Neurologic: no seizure no involuntary movements         Lab Data:   Basic Metabolic Panel: Recent Labs  Lab 10/11/20 0416  NA 142  K 3.5  CL 100  CO2 34*  GLUCOSE 248*  BUN 38*  CREATININE 0.40*  CALCIUM 8.4*    ABG: Recent Labs  Lab 10/10/20 1757 10/12/20 0710  PHART 7.416 7.376  PCO2ART 54.8* 59.9*  PO2ART 60.9* 48.8*  HCO3 34.7* 34.7*  O2SAT 92.4 85.0    Liver Function Tests: No results for input(s): AST, ALT, ALKPHOS, BILITOT, PROT, ALBUMIN in the last 168 hours. No results for input(s): LIPASE, AMYLASE in the last 168 hours. No results for input(s): AMMONIA in the last 168 hours.  CBC: Recent Labs  Lab 10/07/20 1336 10/11/20 0416  WBC 7.5 6.7  HGB 7.8* 7.4*  HCT 26.0* 26.4*  MCV 107.0* 108.2*  PLT 130* 135*    Cardiac Enzymes: No results for input(s): CKTOTAL, CKMB, CKMBINDEX, TROPONINI in the last 168 hours.  BNP (last 3 results) Recent  Labs    09/09/20 0623  BNP 137.4*    ProBNP (last 3 results) No results for input(s): PROBNP in the last 8760 hours.  Radiological Exams: DG Chest Port 1 View  Result Date: 10/12/2020 CLINICAL DATA:  Congestive heart failure. EXAM: PORTABLE CHEST 1 VIEW COMPARISON:  Chest radiograph 10/10/2020. FINDINGS: Monitoring leads overlie the patient. ET tube mid trachea. Cardiac and mediastinal contours largely obscured. Interval worsening diffuse bilateral airspace opacities, particularly on the right. No definite pleural effusion or pneumothorax. IMPRESSION: Interval worsening diffuse bilateral airspace opacities, particularly on the right. Electronically Signed   By: Lovey Newcomer M.D.   On: 10/12/2020 10:48    Assessment/Plan Active Problems:   Acute on chronic respiratory failure with hypoxia ([place])   COVID-19 virus infection   Pneumonia due to COVID-19 virus   History of pulmonary embolism   Severe sepsis ([place])   1. Acute on chronic respiratory failure hypoxia we will continue with assist control titrate oxygen continue pulmonary toilet. 2. COVID-19 virus infection in recovery we will continue to follow 3. Pneumonia due to COVID-19 treated 4. History of pulmonary embolism anticoagulation 5. Severe sepsis resolved   I have personally seen and evaluated the patient, evaluated laboratory and imaging results, formulated the assessment and plan and placed orders. The Patient requires high complexity decision making with multiple systems involvement.  Rounds were done with the Respiratory Therapy Director and Staff therapists and discussed with  nursing staff also.  [name], MD [facility] Pulmonary Critical Care Medicine Sleep Medicine

## 2020-10-15 ENCOUNTER — Other Ambulatory Visit (HOSPITAL_COMMUNITY): Payer: Medicare Other

## 2020-10-15 DIAGNOSIS — Z86711 Personal history of pulmonary embolism: Secondary | ICD-10-CM | POA: Diagnosis not present

## 2020-10-15 DIAGNOSIS — A419 Sepsis, unspecified organism: Secondary | ICD-10-CM | POA: Diagnosis not present

## 2020-10-15 DIAGNOSIS — J9621 Acute and chronic respiratory failure with hypoxia: Secondary | ICD-10-CM | POA: Diagnosis not present

## 2020-10-15 DIAGNOSIS — U071 COVID-19: Secondary | ICD-10-CM | POA: Diagnosis not present

## 2020-10-15 NOTE — Progress Notes (Signed)
Pulmonary Critical Care Medicine [place]   PULMONARY CRITICAL CARE SERVICE  PROGRESS NOTE  Date of Service: 10/15/2020  Marie Chavez  ALP:[id number]  DOB: 1956-02-09   DOA: 09/02/2020  Referring Physician: Merton Border, MD  HPI: Marie Chavez is a 64 y.o. female seen for follow up of Acute on Chronic Respiratory Failure. Patient is currently on assist control has been on 50% FiO2 she her oxygen requirements are still high chest x-ray showing minimal improvement. Spoke with primary care team regarding aggressive diuresis which is what she appears to respond to  Medications: Reviewed on Rounds  Physical Exam:  Vitals: Temperature is 97.0 pulse 72 respiratory rate is 30 blood pressure is 154/89 saturations 98%  Ventilator Settings assist control FiO2 is 50% tidal volume 440 PEEP 5  . General: Comfortable at this time . Eyes: Grossly normal lids, irises & conjunctiva . ENT: grossly tongue is normal . Neck: no obvious mass . Cardiovascular: S1 S2 normal no gallop . Respiratory: No rhonchi no rales noted at this time . Abdomen: soft . Skin: no rash seen on limited exam . Musculoskeletal: not rigid . Psychiatric:unable to assess . Neurologic: no seizure no involuntary movements         Lab Data:   Basic Metabolic Panel: Recent Labs  Lab 10/11/20 0416  NA 142  K 3.5  CL 100  CO2 34*  GLUCOSE 248*  BUN 38*  CREATININE 0.40*  CALCIUM 8.4*    ABG: Recent Labs  Lab 10/10/20 1757 10/12/20 0710  PHART 7.416 7.376  PCO2ART 54.8* 59.9*  PO2ART 60.9* 48.8*  HCO3 34.7* 34.7*  O2SAT 92.4 85.0    Liver Function Tests: No results for input(s): AST, ALT, ALKPHOS, BILITOT, PROT, ALBUMIN in the last 168 hours. No results for input(s): LIPASE, AMYLASE in the last 168 hours. No results for input(s): AMMONIA in the last 168 hours.  CBC: Recent Labs  Lab 10/11/20 0416  WBC 6.7  HGB 7.4*  HCT 26.4*  MCV 108.2*  PLT 135*    Cardiac  Enzymes: No results for input(s): CKTOTAL, CKMB, CKMBINDEX, TROPONINI in the last 168 hours.  BNP (last 3 results) Recent Labs    09/09/20 0623  BNP 137.4*    ProBNP (last 3 results) No results for input(s): PROBNP in the last 8760 hours.  Radiological Exams: DG CHEST PORT 1 VIEW  Result Date: 10/15/2020 CLINICAL DATA:  Respiratory failure EXAM: PORTABLE CHEST 1 VIEW COMPARISON:  Three days ago FINDINGS: Confluent bilateral pulmonary opacity. Lung volumes remain low. Tracheostomy tube remains well seated. Cardiomegaly accentuated by mediastinal fat hypertrophy. No visible air leak. IMPRESSION: Stable generalized opacity without detected change since 09/19/2020. Electronically Signed   By: Monte Fantasia M.D.   On: 10/15/2020 07:15    Assessment/Plan Active Problems:   Acute on chronic respiratory failure with hypoxia ([place])   COVID-19 virus infection   Pneumonia due to COVID-19 virus   History of pulmonary embolism   Severe sepsis ([place])   1. Acute on chronic respiratory failure hypoxia we'll continue with full support on the ventilator. No weaning at this time. Spoke with respiratory therapy during rounds to increase the PEEP to 8 2. COVID-19 virus infection in recovery we'll continue with supportive care 3. Pneumonia due to COVID-19 treated 4. History of pulmonary embolism at baseline 5. Severe sepsis resolved   I have personally seen and evaluated the patient, evaluated laboratory and imaging results, formulated the assessment and plan and placed orders. The Patient  requires high complexity decision making with multiple systems involvement.  Rounds were done with the Respiratory Therapy Director and Staff therapists and discussed with nursing staff also.  [name], MD [facility] Pulmonary Critical Care Medicine Sleep Medicine

## 2020-10-16 DIAGNOSIS — A419 Sepsis, unspecified organism: Secondary | ICD-10-CM | POA: Diagnosis not present

## 2020-10-16 DIAGNOSIS — J9621 Acute and chronic respiratory failure with hypoxia: Secondary | ICD-10-CM | POA: Diagnosis not present

## 2020-10-16 DIAGNOSIS — Z86711 Personal history of pulmonary embolism: Secondary | ICD-10-CM | POA: Diagnosis not present

## 2020-10-16 DIAGNOSIS — U071 COVID-19: Secondary | ICD-10-CM | POA: Diagnosis not present

## 2020-10-16 LAB — BLOOD GAS, ARTERIAL
Acid-Base Excess: 11.5 mmol/L — ABNORMAL HIGH (ref 0.0–2.0)
Bicarbonate: 36.2 mmol/L — ABNORMAL HIGH (ref 20.0–28.0)
FIO2: 45
O2 Saturation: 99.8 %
Patient temperature: 36.9
pCO2 arterial: 53.3 mmHg — ABNORMAL HIGH (ref 32.0–48.0)
pH, Arterial: 7.446 (ref 7.350–7.450)
pO2, Arterial: 150 mmHg — ABNORMAL HIGH (ref 83.0–108.0)

## 2020-10-16 NOTE — Progress Notes (Signed)
PROGRESS NOTE    Marie Chavez  HQP:[id number] DOB: 1956/02/03 DOA: 09/02/2020   Brief Narrative:  Marie Chavez is an 64 y.o. female with medical history of asthma who initially presented to [facility] on 07/28/2020 with 4-day history of worsening shortness of breath.  Patient apparently tested positive for COVID-19 infection on 07/25/2020.  She continued to have cough with worsening shortness of breath nausea, diaphoresis.  She apparently is on 3 L oxygen at baseline per records from the outside facility.  On EMS arrival patient had 72% oxygen saturation and was placed on nonrebreather with 60 L with some improvement.  Regarding her COVID-19 infection she received monoclonal antibodies - Casirivimab & Imdevimab.  On 07/30/2019 when she was placed on BiPAP.  However, continued to have worsening respiratory failure therefore on 07/31/2019 when she was intubated.  On 08/08/2020 patient had head CT which showed concerns for acute infarct.  Neurology was consulted.  MRI was negative.  On 08/16/2019 when she had tracheostomy. On 08/26/2019 patient was noted to have fever and leukocytosis therefore started on antibiotics.  There was concern for septic arthritis in the right knee.  Ortho was consulted.  She had fluid aspiration which was negative.  Due to her complex medical problems she was transferred to [facility] and admitted here on 09/02/2020.  Patient currently is on 50% FiO2, PEEP of 7.  She is able to open eyes and nod her head when asked questions.  She has severe debility with generalized weakness.  Respirate cultures from 09/06/2020 showed Pseudomonas aeruginosa for which she was treated with ceftazidime.  She has repeat respiratory cultures from 09/26/2020 which again is showing moderate Pseudomonas aeruginosa.  She was treated with IV vancomycin, ceftazidime, Flagyl. 10/16/2020: She is now back on vent.  On 35% FiO2 five of PEEP.  Repeat respiratory cultures still  showing Pseudomonas.  Chest x-ray-per report confluent bilateral pulmonary opacity.  Assessment & Plan:  Active Problems:   Acute on chronic respiratory failure with hypoxia, ventilator dependent   Pneumonia with Pseudomonas   COVID-19 virus infection facial herpes zoster Systemic inflammatory response syndrome COPD Protein calorie malnutrition/dysphagia History of seizures Hypertension/hypothyroidism Thrombocytopenia    Acute on chronic respiratory failure with hypoxemia: She had respiratory cultures that showed Pseudomonas.  She already has been treated with antibiotics.  However, now she is back on the vent.  Chest x-ray is still showing findings concerning for pneumonia.  Repeat respiratory cultures again showing the Pseudomonas.  Started back on ciprofloxacin, Flagyl, tobramycin nebulizers.  We will plan to treat for tentative duration of 1 week pending improvement. If she is not improving consider repeating chest imaging preferably chest CT to better evaluate. She is high risk for ARDS secondary to recent COVID-19 infection. Pulmonary following.  Pneumonia: She initially had COVID-19 infection and subsequently had secondary bacterial pneumonia with respiratory cultures that showed Pseudomonas.  She was already treated with antibiotics.  However, now back on the vent with chest x-ray findings as mentioned above with repeat respiratory cultures again showing Pseudomonas.  Restarted on ciprofloxacin, Flagyl, tobramycin nebulizers with a tentative plan to treat for duration of 1 week pending improvement.  If she is not improving consider repeating chest imaging preferably chest CT to better evaluate.    Facial herpes zoster: Per primary team patient received treatment with IV acyclovir.  Improved.  Systemic inflammatory response syndrome: Likely secondary to the pneumonia. Blood cultures from 09/26/2020 showing gram-positive cocci, reported as staph epidermidis.  Likely contaminant.  Antibiotics as mentioned above.    COPD: Continue medication and management per the primary team.  Protein calorie malnutrition/dysphagia: Management per the primary team.  Due to her dysphagia she is high risk for aspiration and aspiration pneumonia.  History of seizures: Antibiotics at times tend to lower the seizure threshold.  However, at this time she needs antibiotics because of the pneumonia.  Continue to monitor closely. Continue medications and management per primary team.  Hypertension/hypothyroidism: Continue medication and management per primary team.  Thrombocytopenia: Patient noted to have low platelet count.  She also has bruising of her upper extremities. Continue to monitor closely.  Further investigation and management per the primary team.  Due to her complex medical problems she is high risk for worsening and decompensation.  Plan of care discussed with the primary team.   Subjective: She is back on the vent, on 35% FiO2, five of PEEP.  Repeat respiratory cultures still showing Pseudomonas.  Chest x-ray continues to show confluent bilateral pulmonary opacity.  Objective: Vitals: Temperature 98.1, pulse 79, respiratory rate 25, on 35% FiO2, PEEP of five, blood pressure 121/61 Examination: Constitutional: ill appearing, awake Head: Atraumatic, normocephalic Eyes: PERLA, EOMI  ENMT: external ears and nose appear normal, normal hearing, Lips appears normal, moist oral mucosa  Neck: trach in place   CVS: S1-S2  Respiratory:  Coarse breath sounds with rhonchi Abdomen: soft nontender, nondistended, positive bowel sounds Musculoskeletal: Edema Neuro: Debility with generalized weakness Psych: stable mood and affect Skin:  No new rashes    Data Reviewed: I have personally reviewed following labs and imaging studies  CBC: Recent Labs  Lab 10/11/20 0416  WBC 6.7  HGB 7.4*  HCT 26.4*  MCV 108.2*  PLT 135*    Basic Metabolic Panel: Recent Labs  Lab  10/11/20 0416  NA 142  K 3.5  CL 100  CO2 34*  GLUCOSE 248*  BUN 38*  CREATININE 0.40*  CALCIUM 8.4*    GFR: CrCl cannot be calculated (Unknown ideal weight.).  Liver Function Tests: No results for input(s): AST, ALT, ALKPHOS, BILITOT, PROT, ALBUMIN in the last 168 hours.  CBG: No results for input(s): GLUCAP in the last 168 hours.   Recent Results (from the past 240 hour(s))  Culture, respiratory (non-expectorated)     Status: None   Collection Time: 10/10/20  4:48 PM   Specimen: Tracheal Aspirate; Respiratory  Result Value Ref Range Status   Specimen Description TRACHEAL ASPIRATE  Final   Special Requests NONE  Final   Gram Stain   Final    MODERATE WBC PRESENT, PREDOMINANTLY PMN FEW GRAM POSITIVE COCCI IN PAIRS Performed at [facility], [address] [place], [place] [address]    Culture RARE PSEUDOMONAS AERUGINOSA  Final   Report Status 10/13/2020 FINAL  Final   Organism ID, Bacteria PSEUDOMONAS AERUGINOSA  Final      Susceptibility   Pseudomonas aeruginosa - MIC*    CEFTAZIDIME >=64 RESISTANT Resistant     CIPROFLOXACIN <=0.25 SENSITIVE Sensitive     GENTAMICIN <=1 SENSITIVE Sensitive     IMIPENEM 1 SENSITIVE Sensitive     * RARE PSEUDOMONAS AERUGINOSA         Radiology Studies: DG CHEST PORT 1 VIEW  Result Date: 10/15/2020 CLINICAL DATA:  Respiratory failure EXAM: PORTABLE CHEST 1 VIEW COMPARISON:  Three days ago FINDINGS: Confluent bilateral pulmonary opacity. Lung volumes remain low. Tracheostomy tube remains well seated. Cardiomegaly accentuated by mediastinal fat hypertrophy. No visible air  leak. IMPRESSION: Stable generalized opacity without detected change since 09/19/2020. Electronically Signed   By: Monte Fantasia M.D.   On: 10/15/2020 07:15    Scheduled Meds: Please see MAR    Yaakov Guthrie, MD  10/16/2020, 4:49 PM

## 2020-10-16 NOTE — Progress Notes (Signed)
Pulmonary Critical Care Medicine [place]   PULMONARY CRITICAL CARE SERVICE  PROGRESS NOTE  Date of Service: 10/16/2020  KIERRIA FEIGENBAUM  LGX:[id number]  DOB: June 26, 1956   DOA: 09/02/2020  Referring Physician: Merton Border, MD  HPI: SHAYONA HIBBITTS is a 64 y.o. female seen for follow up of Acute on Chronic Respiratory Failure.  Patient currently is on assist control mode has been on 50% FiO2 with good saturations.  Medications: Reviewed on Rounds  Physical Exam:  Vitals: Temperature is 96.8 pulse 87 respiratory 15 blood pressure is 148/78 saturations 98  Ventilator Settings on assist control FiO2 50% tidal volume 440 PEEP 5   General: Comfortable at this time  Eyes: Grossly normal lids, irises & conjunctiva  ENT: grossly tongue is normal  Neck: no obvious mass  Cardiovascular: S1 S2 normal no gallop  Respiratory: No rhonchi no rales noted at this time  Abdomen: soft  Skin: no rash seen on limited exam  Musculoskeletal: not rigid  Psychiatric:unable to assess  Neurologic: no seizure no involuntary movements         Lab Data:   Basic Metabolic Panel: Recent Labs  Lab 10/11/20 0416  NA 142  K 3.5  CL 100  CO2 34*  GLUCOSE 248*  BUN 38*  CREATININE 0.40*  CALCIUM 8.4*    ABG: Recent Labs  Lab 10/10/20 1757 10/12/20 0710  PHART 7.416 7.376  PCO2ART 54.8* 59.9*  PO2ART 60.9* 48.8*  HCO3 34.7* 34.7*  O2SAT 92.4 85.0    Liver Function Tests: No results for input(s): AST, ALT, ALKPHOS, BILITOT, PROT, ALBUMIN in the last 168 hours. No results for input(s): LIPASE, AMYLASE in the last 168 hours. No results for input(s): AMMONIA in the last 168 hours.  CBC: Recent Labs  Lab 10/11/20 0416  WBC 6.7  HGB 7.4*  HCT 26.4*  MCV 108.2*  PLT 135*    Cardiac Enzymes: No results for input(s): CKTOTAL, CKMB, CKMBINDEX, TROPONINI in the last 168 hours.  BNP (last 3 results) Recent Labs    09/09/20 0623  BNP 137.4*     ProBNP (last 3 results) No results for input(s): PROBNP in the last 8760 hours.  Radiological Exams: DG CHEST PORT 1 VIEW  Result Date: 10/15/2020 CLINICAL DATA:  Respiratory failure EXAM: PORTABLE CHEST 1 VIEW COMPARISON:  Three days ago FINDINGS: Confluent bilateral pulmonary opacity. Lung volumes remain low. Tracheostomy tube remains well seated. Cardiomegaly accentuated by mediastinal fat hypertrophy. No visible air leak. IMPRESSION: Stable generalized opacity without detected change since 09/19/2020. Electronically Signed   By: Monte Fantasia M.D.   On: 10/15/2020 07:15    Assessment/Plan Active Problems:   Acute on chronic respiratory failure with hypoxia ([place])   COVID-19 virus infection   Pneumonia due to COVID-19 virus   History of pulmonary embolism   Severe sepsis ([place])   1. Acute on chronic respiratory failure with hypoxia we will continue with full support on assist control titrate oxygen continue pulmonary toilet. 2. COVID-19 virus infection with Covid 3. Pneumonia due to COVID-19 treated improved 4. History of pulmonary embolism at baseline 5. Severe sepsis resolved ARDS pattern x-ray still not showing much improvement   I have personally seen and evaluated the patient, evaluated laboratory and imaging results, formulated the assessment and plan and placed orders. The Patient requires high complexity decision making with multiple systems involvement.  Rounds were done with the Respiratory Therapy Director and Staff therapists and discussed with nursing staff also.  [name],  MD [facility] Pulmonary Critical Care Medicine Sleep Medicine

## 2020-10-17 DIAGNOSIS — A419 Sepsis, unspecified organism: Secondary | ICD-10-CM | POA: Diagnosis not present

## 2020-10-17 DIAGNOSIS — J9621 Acute and chronic respiratory failure with hypoxia: Secondary | ICD-10-CM | POA: Diagnosis not present

## 2020-10-17 DIAGNOSIS — U071 COVID-19: Secondary | ICD-10-CM | POA: Diagnosis not present

## 2020-10-17 DIAGNOSIS — Z86711 Personal history of pulmonary embolism: Secondary | ICD-10-CM | POA: Diagnosis not present

## 2020-10-17 NOTE — Progress Notes (Signed)
Pulmonary Critical Care Medicine [place]   PULMONARY CRITICAL CARE SERVICE  PROGRESS NOTE  Date of Service: 10/17/2020  NYIESHA BEEVER  [id number]:[id number]  DOB: 07-12-1956   DOA: 09/02/2020  Referring Physician: Merton Border, MD  HPI: Marie Chavez is a 64 y.o. female seen for follow up of Acute on Chronic Respiratory Failure.  Patient currently is on T collar has been on 35% FiO2 good saturations are noted  Medications: Reviewed on Rounds  Physical Exam:  Vitals: Temperature is 96.6 pulse 81 respiratory 26 blood pressure is 120/75 saturations 99%  Ventilator Settings on T collar with an FiO2 of 35%  . General: Comfortable at this time . Eyes: Grossly normal lids, irises & conjunctiva . ENT: grossly tongue is normal . Neck: no obvious mass . Cardiovascular: S1 S2 normal no gallop . Respiratory: No rhonchi no rales are noted at this time . Abdomen: soft . Skin: no rash seen on limited exam . Musculoskeletal: not rigid . Psychiatric:unable to assess . Neurologic: no seizure no involuntary movements         Lab Data:   Basic Metabolic Panel: Recent Labs  Lab 10/11/20 0416  NA 142  K 3.5  CL 100  CO2 34*  GLUCOSE 248*  BUN 38*  CREATININE 0.40*  CALCIUM 8.4*    ABG: Recent Labs  Lab 10/10/20 1757 10/12/20 0710 10/16/20 1428  PHART 7.416 7.376 7.446  PCO2ART 54.8* 59.9* 53.3*  PO2ART 60.9* 48.8* 150*  HCO3 34.7* 34.7* 36.2*  O2SAT 92.4 85.0 99.8    Liver Function Tests: No results for input(s): AST, ALT, ALKPHOS, BILITOT, PROT, ALBUMIN in the last 168 hours. No results for input(s): LIPASE, AMYLASE in the last 168 hours. No results for input(s): AMMONIA in the last 168 hours.  CBC: Recent Labs  Lab 10/11/20 0416  WBC 6.7  HGB 7.4*  HCT 26.4*  MCV 108.2*  PLT 135*    Cardiac Enzymes: No results for input(s): CKTOTAL, CKMB, CKMBINDEX, TROPONINI in the last 168 hours.  BNP (last 3 results) Recent Labs    09/09/20 0623   BNP 137.4*    ProBNP (last 3 results) No results for input(s): PROBNP in the last 8760 hours.  Radiological Exams: No results found.  Assessment/Plan Active Problems:   Acute on chronic respiratory failure with hypoxia ([place])   COVID-19 virus infection   Pneumonia due to COVID-19 virus   History of pulmonary embolism   Severe sepsis ([place])   1. Acute on chronic respiratory failure hypoxia we will continue T collar trials patient is on 35% FiO2. 2. COVID-19 virus infection recovery phase 3. Pneumonia due to COVID-19 treated 4. History of pulmonary embolism treated 5. Severe sepsis resolved   I have personally seen and evaluated the patient, evaluated laboratory and imaging results, formulated the assessment and plan and placed orders. The Patient requires high complexity decision making with multiple systems involvement.  Rounds were done with the Respiratory Therapy Director and Staff therapists and discussed with nursing staff also.  [name], MD [facility] Pulmonary Critical Care Medicine Sleep Medicine

## 2020-10-18 DIAGNOSIS — A419 Sepsis, unspecified organism: Secondary | ICD-10-CM | POA: Diagnosis not present

## 2020-10-18 DIAGNOSIS — U071 COVID-19: Secondary | ICD-10-CM | POA: Diagnosis not present

## 2020-10-18 DIAGNOSIS — J9621 Acute and chronic respiratory failure with hypoxia: Secondary | ICD-10-CM | POA: Diagnosis not present

## 2020-10-18 DIAGNOSIS — Z86711 Personal history of pulmonary embolism: Secondary | ICD-10-CM | POA: Diagnosis not present

## 2020-10-18 LAB — BASIC METABOLIC PANEL
Anion gap: 14 (ref 5–15)
BUN: 42 mg/dL — ABNORMAL HIGH (ref 8–23)
CO2: 31 mmol/L (ref 22–32)
Calcium: 8.6 mg/dL — ABNORMAL LOW (ref 8.9–10.3)
Chloride: 97 mmol/L — ABNORMAL LOW (ref 98–111)
Creatinine, Ser: 0.45 mg/dL (ref 0.44–1.00)
GFR, Estimated: >60 mL/min (ref >60)
Glucose, Bld: 263 mg/dL — ABNORMAL HIGH (ref 70–99)
Potassium: 3.4 mmol/L — ABNORMAL LOW (ref 3.5–5.1)
Sodium: 142 mmol/L (ref 135–145)

## 2020-10-18 LAB — BLOOD GAS, ARTERIAL
Acid-Base Excess: 9.3 mmol/L — ABNORMAL HIGH (ref 0.0–2.0)
Bicarbonate: 34 mmol/L — ABNORMAL HIGH (ref 20.0–28.0)
FIO2: 35
O2 Saturation: 99.6 %
Patient temperature: 36.8
pCO2 arterial: 52.8 mmHg — ABNORMAL HIGH (ref 32.0–48.0)
pH, Arterial: 7.423 (ref 7.350–7.450)
pO2, Arterial: 143 mmHg — ABNORMAL HIGH (ref 83.0–108.0)

## 2020-10-18 NOTE — Progress Notes (Signed)
Pulmonary Critical Care Medicine [place]   PULMONARY CRITICAL CARE SERVICE  PROGRESS NOTE  Date of Service: 10/18/2020  Marie Chavez  [id number]:[id number]  DOB: 10/24/1956   DOA: 09/02/2020  Referring Physician: Merton Border, MD  HPI: Marie Chavez is a 64 y.o. female seen for follow up of Acute on Chronic Respiratory Failure.  Patient is comfortable right now without distress at this time.  Remains off the ventilator on T collar  Medications: Reviewed on Rounds  Physical Exam:  Vitals: Temperature is 98.6 pulse 101 respiratory 16 blood pressure is 176/72 saturations 98%  Ventilator Settings on T collar  . General: Comfortable at this time . Eyes: Grossly normal lids, irises & conjunctiva . ENT: grossly tongue is normal . Neck: no obvious mass . Cardiovascular: S1 S2 normal no gallop . Respiratory: No rhonchi no rales . Abdomen: soft . Skin: no rash seen on limited exam . Musculoskeletal: not rigid . Psychiatric:unable to assess . Neurologic: no seizure no involuntary movements         Lab Data:   Basic Metabolic Panel: Recent Labs  Lab 10/18/20 0300  NA 142  K 3.4*  CL 97*  CO2 31  GLUCOSE 263*  BUN 42*  CREATININE 0.45  CALCIUM 8.6*    ABG: Recent Labs  Lab 10/12/20 0710 10/16/20 1428  PHART 7.376 7.446  PCO2ART 59.9* 53.3*  PO2ART 48.8* 150*  HCO3 34.7* 36.2*  O2SAT 85.0 99.8    Liver Function Tests: No results for input(s): AST, ALT, ALKPHOS, BILITOT, PROT, ALBUMIN in the last 168 hours. No results for input(s): LIPASE, AMYLASE in the last 168 hours. No results for input(s): AMMONIA in the last 168 hours.  CBC: No results for input(s): WBC, NEUTROABS, HGB, HCT, MCV, PLT in the last 168 hours.  Cardiac Enzymes: No results for input(s): CKTOTAL, CKMB, CKMBINDEX, TROPONINI in the last 168 hours.  BNP (last 3 results) Recent Labs    09/09/20 0623  BNP 137.4*    ProBNP (last 3 results) No results for input(s):  PROBNP in the last 8760 hours.  Radiological Exams: No results found.  Assessment/Plan Active Problems:   Acute on chronic respiratory failure with hypoxia ([place])   COVID-19 virus infection   Pneumonia due to COVID-19 virus   History of pulmonary embolism   Severe sepsis ([place])   1. Acute on chronic respiratory failure hypoxia we will continue with T-piece titrate oxygen continue pulmonary toilet. 2. COVID-19 virus infection in recovery 3. Pneumonia due to COVID-19 treated improving 4. History of pulmonary embolism has been on anticoagulation 5. Severe sepsis   I have personally seen and evaluated the patient, evaluated laboratory and imaging results, formulated the assessment and plan and placed orders. The Patient requires high complexity decision making with multiple systems involvement.  Rounds were done with the Respiratory Therapy Director and Staff therapists and discussed with nursing staff also.  [name], MD [facility] Pulmonary Critical Care Medicine Sleep Medicine

## 2020-10-19 DIAGNOSIS — A419 Sepsis, unspecified organism: Secondary | ICD-10-CM | POA: Diagnosis not present

## 2020-10-19 DIAGNOSIS — J9621 Acute and chronic respiratory failure with hypoxia: Secondary | ICD-10-CM | POA: Diagnosis not present

## 2020-10-19 DIAGNOSIS — Z86711 Personal history of pulmonary embolism: Secondary | ICD-10-CM | POA: Diagnosis not present

## 2020-10-19 DIAGNOSIS — U071 COVID-19: Secondary | ICD-10-CM | POA: Diagnosis not present

## 2020-10-19 NOTE — Progress Notes (Signed)
Pulmonary Critical Care Medicine [place]   PULMONARY CRITICAL CARE SERVICE  PROGRESS NOTE  Date of Service: 10/19/2020  Marie Chavez  [id number]:[id number]  DOB: 09/10/1956   DOA: 09/02/2020  Referring Physician: Merton Border, MD  HPI: Marie Chavez is a 64 y.o. female seen for follow up of Acute on Chronic Respiratory Failure.  She remains off the ventilator right now is on 28% FiO2 and has been off the ventilator for 48 hours  Medications: Reviewed on Rounds  Physical Exam:  Vitals: Temperature 97.6 pulse 83 respiratory rate is 26 blood pressure 129/70 saturations 97%  Ventilator Settings currently on T collar FiO2 28%  . General: Comfortable at this time . Eyes: Grossly normal lids, irises & conjunctiva . ENT: grossly tongue is normal . Neck: no obvious mass . Cardiovascular: S1 S2 normal no gallop . Respiratory: No rhonchi very coarse breath sounds . Abdomen: soft . Skin: no rash seen on limited exam . Musculoskeletal: not rigid . Psychiatric:unable to assess . Neurologic: no seizure no involuntary movements         Lab Data:   Basic Metabolic Panel: Recent Labs  Lab 10/18/20 0300  NA 142  K 3.4*  CL 97*  CO2 31  GLUCOSE 263*  BUN 42*  CREATININE 0.45  CALCIUM 8.6*    ABG: Recent Labs  Lab 10/16/20 1428 10/18/20 1318  PHART 7.446 7.423  PCO2ART 53.3* 52.8*  PO2ART 150* 143*  HCO3 36.2* 34.0*  O2SAT 99.8 99.6    Liver Function Tests: No results for input(s): AST, ALT, ALKPHOS, BILITOT, PROT, ALBUMIN in the last 168 hours. No results for input(s): LIPASE, AMYLASE in the last 168 hours. No results for input(s): AMMONIA in the last 168 hours.  CBC: No results for input(s): WBC, NEUTROABS, HGB, HCT, MCV, PLT in the last 168 hours.  Cardiac Enzymes: No results for input(s): CKTOTAL, CKMB, CKMBINDEX, TROPONINI in the last 168 hours.  BNP (last 3 results) Recent Labs    09/09/20 0623  BNP 137.4*    ProBNP (last 3  results) No results for input(s): PROBNP in the last 8760 hours.  Radiological Exams: No results found.  Assessment/Plan Active Problems:   Acute on chronic respiratory failure with hypoxia ([place])   COVID-19 virus infection   Pneumonia due to COVID-19 virus   History of pulmonary embolism   Severe sepsis ([place])   1. Acute on chronic respiratory failure with hypoxia we will continue with T collar goal of 48 hours. 2. COVID-19 virus infection in recovery phase 3. Pneumonia due to COVID-19 treated 4. History of pulmonary embolism no change 5. Severe sepsis resolved   I have personally seen and evaluated the patient, evaluated laboratory and imaging results, formulated the assessment and plan and placed orders. The Patient requires high complexity decision making with multiple systems involvement.  Rounds were done with the Respiratory Therapy Director and Staff therapists and discussed with nursing staff also.  [name], MD [facility] Pulmonary Critical Care Medicine Sleep Medicine

## 2020-10-20 DIAGNOSIS — A419 Sepsis, unspecified organism: Secondary | ICD-10-CM | POA: Diagnosis not present

## 2020-10-20 DIAGNOSIS — Z86711 Personal history of pulmonary embolism: Secondary | ICD-10-CM | POA: Diagnosis not present

## 2020-10-20 DIAGNOSIS — U071 COVID-19: Secondary | ICD-10-CM | POA: Diagnosis not present

## 2020-10-20 DIAGNOSIS — J9621 Acute and chronic respiratory failure with hypoxia: Secondary | ICD-10-CM | POA: Diagnosis not present

## 2020-10-20 NOTE — Progress Notes (Signed)
Pulmonary Critical Care Medicine [place]   PULMONARY CRITICAL CARE SERVICE  PROGRESS NOTE  Date of Service: 10/20/2020  Marie Chavez  [id number]:[id number]  DOB: 1956/09/30   DOA: 09/02/2020  Referring Physician: Merton Border, MD  HPI: Marie Chavez is a 64 y.o. female seen for follow up of Acute on Chronic Respiratory Failure.  Patient is on T collar currently on 40% FiO2 today will be 72 hours  Medications: Reviewed on Rounds  Physical Exam:  Vitals: Temperature is 97.5 pulse 90 respiratory 19 blood pressure is 149/73 saturations 95%  Ventilator Settings on T collar with an FiO2 of 40%  . General: Comfortable at this time . Eyes: Grossly normal lids, irises & conjunctiva . ENT: grossly tongue is normal . Neck: no obvious mass . Cardiovascular: S1 S2 normal no gallop . Respiratory: No rhonchi no rales are noted . Abdomen: soft . Skin: no rash seen on limited exam . Musculoskeletal: not rigid . Psychiatric:unable to assess . Neurologic: no seizure no involuntary movements         Lab Data:   Basic Metabolic Panel: Recent Labs  Lab 10/18/20 0300  NA 142  K 3.4*  CL 97*  CO2 31  GLUCOSE 263*  BUN 42*  CREATININE 0.45  CALCIUM 8.6*    ABG: Recent Labs  Lab 10/16/20 1428 10/18/20 1318  PHART 7.446 7.423  PCO2ART 53.3* 52.8*  PO2ART 150* 143*  HCO3 36.2* 34.0*  O2SAT 99.8 99.6    Liver Function Tests: No results for input(s): AST, ALT, ALKPHOS, BILITOT, PROT, ALBUMIN in the last 168 hours. No results for input(s): LIPASE, AMYLASE in the last 168 hours. No results for input(s): AMMONIA in the last 168 hours.  CBC: No results for input(s): WBC, NEUTROABS, HGB, HCT, MCV, PLT in the last 168 hours.  Cardiac Enzymes: No results for input(s): CKTOTAL, CKMB, CKMBINDEX, TROPONINI in the last 168 hours.  BNP (last 3 results) Recent Labs    09/09/20 0623  BNP 137.4*    ProBNP (last 3 results) No results for input(s): PROBNP in  the last 8760 hours.  Radiological Exams: No results found.  Assessment/Plan Active Problems:   Acute on chronic respiratory failure with hypoxia ([place])   COVID-19 virus infection   Pneumonia due to COVID-19 virus   History of pulmonary embolism   Severe sepsis ([place])   1. Acute on chronic respiratory failure hypoxia we will continue with T-piece with a goal of 72 hours 2. COVID-19 virus infection in recovery we will continue to follow 3. Pneumonia due to COVID-19 treated we will continue with present management 4. History of pulmonary embolism at baseline 5. Severe sepsis resolved   I have personally seen and evaluated the patient, evaluated laboratory and imaging results, formulated the assessment and plan and placed orders. The Patient requires high complexity decision making with multiple systems involvement.  Rounds were done with the Respiratory Therapy Director and Staff therapists and discussed with nursing staff also.  [name], MD [facility] Pulmonary Critical Care Medicine Sleep Medicine

## 2020-10-21 DIAGNOSIS — J9621 Acute and chronic respiratory failure with hypoxia: Secondary | ICD-10-CM | POA: Diagnosis not present

## 2020-10-21 DIAGNOSIS — Z86711 Personal history of pulmonary embolism: Secondary | ICD-10-CM | POA: Diagnosis not present

## 2020-10-21 DIAGNOSIS — U071 COVID-19: Secondary | ICD-10-CM | POA: Diagnosis not present

## 2020-10-21 DIAGNOSIS — A419 Sepsis, unspecified organism: Secondary | ICD-10-CM | POA: Diagnosis not present

## 2020-10-21 NOTE — Progress Notes (Signed)
Pulmonary Critical Care Medicine [place]   PULMONARY CRITICAL CARE SERVICE  PROGRESS NOTE  Date of Service: 10/21/2020  Marie Chavez  ZOX:[id number]  DOB: Jun 22, 1956   DOA: 09/02/2020  Referring Physician: Merton Border, MD  HPI: Marie Chavez is a 64 y.o. female seen for follow up of Acute on Chronic Respiratory Failure.  Patient currently is on T collar ready for change in tracheostomy to a cuffless trach  Medications: Reviewed on Rounds  Physical Exam:  Vitals: Temperature is 97.2 pulse 88 respiratory rate 30 blood pressure is 162/80 saturations 95%  Ventilator Settings on T collar  . General: Comfortable at this time . Eyes: Grossly normal lids, irises & conjunctiva . ENT: grossly tongue is normal . Neck: no obvious mass . Cardiovascular: S1 S2 normal no gallop . Respiratory: No rhonchi no rales no . Abdomen: soft . Skin: no rash seen on limited exam . Musculoskeletal: not rigid . Psychiatric:unable to assess . Neurologic: no seizure no involuntary movements         Lab Data:   Basic Metabolic Panel: Recent Labs  Lab 10/18/20 0300  NA 142  K 3.4*  CL 97*  CO2 31  GLUCOSE 263*  BUN 42*  CREATININE 0.45  CALCIUM 8.6*    ABG: Recent Labs  Lab 10/16/20 1428 10/18/20 1318  PHART 7.446 7.423  PCO2ART 53.3* 52.8*  PO2ART 150* 143*  HCO3 36.2* 34.0*  O2SAT 99.8 99.6    Liver Function Tests: No results for input(s): AST, ALT, ALKPHOS, BILITOT, PROT, ALBUMIN in the last 168 hours. No results for input(s): LIPASE, AMYLASE in the last 168 hours. No results for input(s): AMMONIA in the last 168 hours.  CBC: No results for input(s): WBC, NEUTROABS, HGB, HCT, MCV, PLT in the last 168 hours.  Cardiac Enzymes: No results for input(s): CKTOTAL, CKMB, CKMBINDEX, TROPONINI in the last 168 hours.  BNP (last 3 results) Recent Labs    09/09/20 0623  BNP 137.4*    ProBNP (last 3 results) No results for input(s): PROBNP in the  last 8760 hours.  Radiological Exams: No results found.  Assessment/Plan Active Problems:   Acute on chronic respiratory failure with hypoxia ([place])   COVID-19 virus infection   Pneumonia due to COVID-19 virus   History of pulmonary embolism   Severe sepsis ([place])   1. Acute on chronic respiratory failure hypoxia we will continue T-piece titrate oxygen continue pulmonary toilet 2. COVID-19 virus infection recovery phase 3. Pneumonia due to COVID-19 treated continue to monitor 4. History of pulmonary embolism no change 5. Severe sepsis resolved   I have personally seen and evaluated the patient, evaluated laboratory and imaging results, formulated the assessment and plan and placed orders. The Patient requires high complexity decision making with multiple systems involvement.  Rounds were done with the Respiratory Therapy Director and Staff therapists and discussed with nursing staff also.  [name], MD [facility] Pulmonary Critical Care Medicine Sleep Medicine

## 2020-10-22 DIAGNOSIS — A419 Sepsis, unspecified organism: Secondary | ICD-10-CM | POA: Diagnosis not present

## 2020-10-22 DIAGNOSIS — U071 COVID-19: Secondary | ICD-10-CM | POA: Diagnosis not present

## 2020-10-22 DIAGNOSIS — Z86711 Personal history of pulmonary embolism: Secondary | ICD-10-CM | POA: Diagnosis not present

## 2020-10-22 DIAGNOSIS — J9621 Acute and chronic respiratory failure with hypoxia: Secondary | ICD-10-CM | POA: Diagnosis not present

## 2020-10-22 NOTE — Progress Notes (Signed)
Pulmonary Critical Care Medicine [place]   PULMONARY CRITICAL CARE SERVICE  PROGRESS NOTE  Date of Service: 10/22/2020  Marie Chavez  [id number]:[id number]  DOB: 07-12-1956   DOA: 09/02/2020  Referring Physician: Merton Border, MD  HPI: Marie Chavez is a 64 y.o. female seen for follow up of Acute on Chronic Respiratory Failure.  Patient currently is on T collar has been on 35% FiO2 with good saturations noted  Medications: Reviewed on Rounds  Physical Exam:  Vitals: Temperature is 98.7 pulse 84 respiratory 29 blood pressure is 136/88 saturations 99%  Ventilator Settings on T collar with an FiO2 of 35%  . General: Comfortable at this time . Eyes: Grossly normal lids, irises & conjunctiva . ENT: grossly tongue is normal . Neck: no obvious mass . Cardiovascular: S1 S2 normal no gallop . Respiratory: No rhonchi no rales noted at this . Abdomen: soft . Skin: no rash seen on limited exam . Musculoskeletal: not rigid . Psychiatric:unable to assess . Neurologic: no seizure no involuntary movements         Lab Data:   Basic Metabolic Panel: Recent Labs  Lab 10/18/20 0300  NA 142  K 3.4*  CL 97*  CO2 31  GLUCOSE 263*  BUN 42*  CREATININE 0.45  CALCIUM 8.6*    ABG: Recent Labs  Lab 10/16/20 1428 10/18/20 1318  PHART 7.446 7.423  PCO2ART 53.3* 52.8*  PO2ART 150* 143*  HCO3 36.2* 34.0*  O2SAT 99.8 99.6    Liver Function Tests: No results for input(s): AST, ALT, ALKPHOS, BILITOT, PROT, ALBUMIN in the last 168 hours. No results for input(s): LIPASE, AMYLASE in the last 168 hours. No results for input(s): AMMONIA in the last 168 hours.  CBC: No results for input(s): WBC, NEUTROABS, HGB, HCT, MCV, PLT in the last 168 hours.  Cardiac Enzymes: No results for input(s): CKTOTAL, CKMB, CKMBINDEX, TROPONINI in the last 168 hours.  BNP (last 3 results) Recent Labs    09/09/20 0623  BNP 137.4*    ProBNP (last 3 results) No results for  input(s): PROBNP in the last 8760 hours.  Radiological Exams: No results found.  Assessment/Plan Active Problems:   Acute on chronic respiratory failure with hypoxia ([place])   COVID-19 virus infection   Pneumonia due to COVID-19 virus   History of pulmonary embolism   Severe sepsis ([place])   1. Acute on chronic respiratory failure hypoxia we will continue with T collar trials titrate oxygen continue pulmonary toilet. 2. COVID-19 virus infection recovery we will continue to follow 3. Pneumonia due to COVID-19 treated 4. History of pulmonary embolism at baseline 5. Severe sepsis resolved   I have personally seen and evaluated the patient, evaluated laboratory and imaging results, formulated the assessment and plan and placed orders. The Patient requires high complexity decision making with multiple systems involvement.  Rounds were done with the Respiratory Therapy Director and Staff therapists and discussed with nursing staff also.  [name], MD [facility] Pulmonary Critical Care Medicine Sleep Medicine

## 2020-10-23 ENCOUNTER — Other Ambulatory Visit (HOSPITAL_COMMUNITY): Payer: Medicare Other

## 2020-10-23 DIAGNOSIS — J9621 Acute and chronic respiratory failure with hypoxia: Secondary | ICD-10-CM | POA: Diagnosis not present

## 2020-10-23 DIAGNOSIS — Z86711 Personal history of pulmonary embolism: Secondary | ICD-10-CM | POA: Diagnosis not present

## 2020-10-23 DIAGNOSIS — A419 Sepsis, unspecified organism: Secondary | ICD-10-CM | POA: Diagnosis not present

## 2020-10-23 DIAGNOSIS — U071 COVID-19: Secondary | ICD-10-CM | POA: Diagnosis not present

## 2020-10-23 LAB — CBC
HCT: 27 % — ABNORMAL LOW (ref 36.0–46.0)
Hemoglobin: 8.3 g/dL — ABNORMAL LOW (ref 12.0–15.0)
MCH: 30.3 pg (ref 26.0–34.0)
MCHC: 30.7 g/dL (ref 30.0–36.0)
MCV: 98.5 fL (ref 80.0–100.0)
Platelets: 138 10*3/uL — ABNORMAL LOW (ref 150–400)
RBC: 2.74 MIL/uL — ABNORMAL LOW (ref 3.87–5.11)
RDW: 18.5 % — ABNORMAL HIGH (ref 11.5–15.5)
WBC: 7.2 10*3/uL (ref 4.0–10.5)
nRBC: 0.7 % — ABNORMAL HIGH (ref 0.0–0.2)

## 2020-10-23 LAB — RENAL FUNCTION PANEL
Albumin: 3.1 g/dL — ABNORMAL LOW (ref 3.5–5.0)
Anion gap: 11 (ref 5–15)
BUN: 30 mg/dL — ABNORMAL HIGH (ref 8–23)
CO2: 32 mmol/L (ref 22–32)
Calcium: 8.7 mg/dL — ABNORMAL LOW (ref 8.9–10.3)
Chloride: 99 mmol/L (ref 98–111)
Creatinine, Ser: 0.43 mg/dL — ABNORMAL LOW (ref 0.44–1.00)
GFR, Estimated: >60 mL/min (ref >60)
Glucose, Bld: 186 mg/dL — ABNORMAL HIGH (ref 70–99)
Phosphorus: 3.2 mg/dL (ref 2.5–4.6)
Potassium: 3.2 mmol/L — ABNORMAL LOW (ref 3.5–5.1)
Sodium: 142 mmol/L (ref 135–145)

## 2020-10-23 LAB — MAGNESIUM: Magnesium: 2.3 mg/dL (ref 1.7–2.4)

## 2020-10-23 NOTE — Progress Notes (Signed)
Pulmonary Critical Care Medicine [place]   PULMONARY CRITICAL CARE SERVICE  PROGRESS NOTE  Date of Service: 10/23/2020  Marie Chavez  [id number]:[id number]  DOB: October 27, 1955   DOA: 09/02/2020  Referring Physician: Merton Border, MD  HPI: Marie Chavez is a 64 y.o. female seen for follow up of Acute on Chronic Respiratory Failure.  Patient currently is on T collar has been on 28% FiO2 with good saturations.  Medications: Reviewed on Rounds  Physical Exam:  Vitals: Temperature is 98.3 pulse 83 respiratory rate 17 blood pressure is 113/66 saturations 94%  Ventilator Settings on T collar FiO2 28%  . General: Comfortable at this time . Eyes: Grossly normal lids, irises & conjunctiva . ENT: grossly tongue is normal . Neck: no obvious mass . Cardiovascular: S1 S2 normal no gallop . Respiratory: No rhonchi no rales noted at this time . Abdomen: soft . Skin: no rash seen on limited exam . Musculoskeletal: not rigid . Psychiatric:unable to assess . Neurologic: no seizure no involuntary movements         Lab Data:   Basic Metabolic Panel: Recent Labs  Lab 10/18/20 0300 10/23/20 0323  NA 142 142  K 3.4* 3.2*  CL 97* 99  CO2 31 32  GLUCOSE 263* 186*  BUN 42* 30*  CREATININE 0.45 0.43*  CALCIUM 8.6* 8.7*  MG  --  2.3  PHOS  --  3.2    ABG: Recent Labs  Lab 10/16/20 1428 10/18/20 1318  PHART 7.446 7.423  PCO2ART 53.3* 52.8*  PO2ART 150* 143*  HCO3 36.2* 34.0*  O2SAT 99.8 99.6    Liver Function Tests: Recent Labs  Lab 10/23/20 0323  ALBUMIN 3.1*   No results for input(s): LIPASE, AMYLASE in the last 168 hours. No results for input(s): AMMONIA in the last 168 hours.  CBC: Recent Labs  Lab 10/23/20 0323  WBC 7.2  HGB 8.3*  HCT 27.0*  MCV 98.5  PLT 138*    Cardiac Enzymes: No results for input(s): CKTOTAL, CKMB, CKMBINDEX, TROPONINI in the last 168 hours.  BNP (last 3 results) Recent Labs    09/09/20 0623  BNP 137.4*     ProBNP (last 3 results) No results for input(s): PROBNP in the last 8760 hours.  Radiological Exams: DG CHEST PORT 1 VIEW  Result Date: 10/23/2020 CLINICAL DATA:  64 year old female with respiratory failure. COVID-19. EXAM: PORTABLE CHEST 1 VIEW COMPARISON:  Portable chest 10/15/2020 and earlier. FINDINGS: Portable AP semi upright view at 0554 hours. Stable tracheostomy. Superimposed prior cervical ACDF. Larger lung volumes and improved bilateral ventilation. Residual basilar predominant interstitial opacity, more confluent left lung base peribronchial opacity. No pneumothorax. No definite effusion. Cardiomegaly versus mediastinal lipomatosis. Stable cardiac size and mediastinal contours. Chronic left rib fractures. IMPRESSION: 1. Improved bilateral ventilation since 10/15/2020 with residual basilar predominant opacity greater on the left. 2. No new cardiopulmonary abnormality. Electronically Signed   By: Genevie Ann M.D.   On: 10/23/2020 06:34    Assessment/Plan Active Problems:   Acute on chronic respiratory failure with hypoxia ([place])   COVID-19 virus infection   Pneumonia due to COVID-19 virus   History of pulmonary embolism   Severe sepsis ([place])   1. Acute on chronic respiratory failure hypoxia we will continue T collar titrate oxygen continue pulmonary toilet. 2. COVID-19 virus infection recovery 3. Pneumonia due to COVID-19 treated 4. History of pulmonary embolism no change 5. Severe sepsis resolved   I have personally seen and evaluated the patient, evaluated  laboratory and imaging results, formulated the assessment and plan and placed orders. The Patient requires high complexity decision making with multiple systems involvement.  Rounds were done with the Respiratory Therapy Director and Staff therapists and discussed with nursing staff also.  [name], MD [facility] Pulmonary Critical Care Medicine Sleep Medicine

## 2020-10-24 DIAGNOSIS — U071 COVID-19: Secondary | ICD-10-CM | POA: Diagnosis not present

## 2020-10-24 DIAGNOSIS — Z86711 Personal history of pulmonary embolism: Secondary | ICD-10-CM | POA: Diagnosis not present

## 2020-10-24 DIAGNOSIS — J9621 Acute and chronic respiratory failure with hypoxia: Secondary | ICD-10-CM | POA: Diagnosis not present

## 2020-10-24 DIAGNOSIS — A419 Sepsis, unspecified organism: Secondary | ICD-10-CM | POA: Diagnosis not present

## 2020-10-24 LAB — POTASSIUM: Potassium: 3.7 mmol/L (ref 3.5–5.1)

## 2020-10-24 NOTE — Progress Notes (Signed)
Pulmonary Critical Care Medicine [place]   PULMONARY CRITICAL CARE SERVICE  PROGRESS NOTE  Date of Service: 10/24/2020  Marie Chavez  [id number]:[id number]  DOB: Sep 09, 1956   DOA: 09/02/2020  Referring Physician: Merton Border, MD  HPI: Marie Chavez is a 64 y.o. female seen for follow up of Acute on Chronic Respiratory Failure.  Patient currently is on T collar has been on 35% FiO2 with good saturations.  Secretions are fairly moderate  Medications: Reviewed on Rounds  Physical Exam:  Vitals: Temperature is 96.7 pulse 88 respiratory rate 15 blood pressure is 131/77 saturations 95%  Ventilator Settings on T collar FiO2 35%  . General: Comfortable at this time . Eyes: Grossly normal lids, irises & conjunctiva . ENT: grossly tongue is normal . Neck: no obvious mass . Cardiovascular: S1 S2 normal no gallop . Respiratory: No rhonchi no rales . Abdomen: soft . Skin: no rash seen on limited exam . Musculoskeletal: not rigid . Psychiatric:unable to assess . Neurologic: no seizure no involuntary movements         Lab Data:   Basic Metabolic Panel: Recent Labs  Lab 10/18/20 0300 10/23/20 0323 10/24/20 0429  NA 142 142  --   K 3.4* 3.2* 3.7  CL 97* 99  --   CO2 31 32  --   GLUCOSE 263* 186*  --   BUN 42* 30*  --   CREATININE 0.45 0.43*  --   CALCIUM 8.6* 8.7*  --   MG  --  2.3  --   PHOS  --  3.2  --     ABG: Recent Labs  Lab 10/18/20 1318  PHART 7.423  PCO2ART 52.8*  PO2ART 143*  HCO3 34.0*  O2SAT 99.6    Liver Function Tests: Recent Labs  Lab 10/23/20 0323  ALBUMIN 3.1*   No results for input(s): LIPASE, AMYLASE in the last 168 hours. No results for input(s): AMMONIA in the last 168 hours.  CBC: Recent Labs  Lab 10/23/20 0323  WBC 7.2  HGB 8.3*  HCT 27.0*  MCV 98.5  PLT 138*    Cardiac Enzymes: No results for input(s): CKTOTAL, CKMB, CKMBINDEX, TROPONINI in the last 168 hours.  BNP (last 3 results) Recent Labs     09/09/20 0623  BNP 137.4*    ProBNP (last 3 results) No results for input(s): PROBNP in the last 8760 hours.  Radiological Exams: DG CHEST PORT 1 VIEW  Result Date: 10/23/2020 CLINICAL DATA:  64 year old female with respiratory failure. COVID-19. EXAM: PORTABLE CHEST 1 VIEW COMPARISON:  Portable chest 10/15/2020 and earlier. FINDINGS: Portable AP semi upright view at 0554 hours. Stable tracheostomy. Superimposed prior cervical ACDF. Larger lung volumes and improved bilateral ventilation. Residual basilar predominant interstitial opacity, more confluent left lung base peribronchial opacity. No pneumothorax. No definite effusion. Cardiomegaly versus mediastinal lipomatosis. Stable cardiac size and mediastinal contours. Chronic left rib fractures. IMPRESSION: 1. Improved bilateral ventilation since 10/15/2020 with residual basilar predominant opacity greater on the left. 2. No new cardiopulmonary abnormality. Electronically Signed   By: Genevie Ann M.D.   On: 10/23/2020 06:34    Assessment/Plan Active Problems:   Acute on chronic respiratory failure with hypoxia ([place])   COVID-19 virus infection   Pneumonia due to COVID-19 virus   History of pulmonary embolism   Severe sepsis ([place])   1. Acute on chronic respiratory failure hypoxia we will continue T collar trials titrate oxygen continue pulmonary toilet. 2. COVID-19 virus infection recovery 3. Pneumonia due  to COVID-19 treated 4. History of pulmonary embolism no change 5. Severe sepsis resolved   I have personally seen and evaluated the patient, evaluated laboratory and imaging results, formulated the assessment and plan and placed orders. The Patient requires high complexity decision making with multiple systems involvement.  Rounds were done with the Respiratory Therapy Director and Staff therapists and discussed with nursing staff also.  [name], MD [facility] Pulmonary Critical Care Medicine Sleep Medicine

## 2020-10-25 DIAGNOSIS — A419 Sepsis, unspecified organism: Secondary | ICD-10-CM | POA: Diagnosis not present

## 2020-10-25 DIAGNOSIS — Z86711 Personal history of pulmonary embolism: Secondary | ICD-10-CM | POA: Diagnosis not present

## 2020-10-25 DIAGNOSIS — U071 COVID-19: Secondary | ICD-10-CM | POA: Diagnosis not present

## 2020-10-25 DIAGNOSIS — J9621 Acute and chronic respiratory failure with hypoxia: Secondary | ICD-10-CM | POA: Diagnosis not present

## 2020-10-25 NOTE — Progress Notes (Signed)
Pulmonary Critical Care Medicine [place]   PULMONARY CRITICAL CARE SERVICE  PROGRESS NOTE  Date of Service: 10/25/2020  Marie Chavez  WER:[id number]  DOB: [birth year]   DOA: 09/02/2020  Referring Physician: Merton Border, MD  HPI: Marie Chavez is a 65 y.o. female seen for follow up of Acute on Chronic Respiratory Failure.  Currently on T collar has been on 35% FiO2 with good saturations  Medications: Reviewed on Rounds  Physical Exam:  Vitals: Temperature is 97.0 pulse 84 respiratory 28 blood pressure is 138/77 saturations 96%  Ventilator Settings on T collar with an FiO2 35%  . General: Comfortable at this time . Eyes: Grossly normal lids, irises & conjunctiva . ENT: grossly tongue is normal . Neck: no obvious mass . Cardiovascular: S1 S2 normal no gallop . Respiratory: Scattered rhonchi expansion is equal . Abdomen: soft . Skin: no rash seen on limited exam . Musculoskeletal: not rigid . Psychiatric:unable to assess . Neurologic: no seizure no involuntary movements         Lab Data:   Basic Metabolic Panel: Recent Labs  Lab 10/23/20 0323 10/24/20 0429  NA 142  --   K 3.2* 3.7  CL 99  --   CO2 32  --   GLUCOSE 186*  --   BUN 30*  --   CREATININE 0.43*  --   CALCIUM 8.7*  --   MG 2.3  --   PHOS 3.2  --     ABG: Recent Labs  Lab 10/18/20 1318  PHART 7.423  PCO2ART 52.8*  PO2ART 143*  HCO3 34.0*  O2SAT 99.6    Liver Function Tests: Recent Labs  Lab 10/23/20 0323  ALBUMIN 3.1*   No results for input(s): LIPASE, AMYLASE in the last 168 hours. No results for input(s): AMMONIA in the last 168 hours.  CBC: Recent Labs  Lab 10/23/20 0323  WBC 7.2  HGB 8.3*  HCT 27.0*  MCV 98.5  PLT 138*    Cardiac Enzymes: No results for input(s): CKTOTAL, CKMB, CKMBINDEX, TROPONINI in the last 168 hours.  BNP (last 3 results) Recent Labs    09/09/20 0623  BNP 137.4*    ProBNP (last 3 results) No results for input(s):  PROBNP in the last 8760 hours.  Radiological Exams: No results found.  Assessment/Plan Active Problems:   Acute on chronic respiratory failure with hypoxia ([place])   COVID-19 virus infection   Pneumonia due to COVID-19 virus   History of pulmonary embolism   Severe sepsis ([place])   1. Acute on chronic respiratory failure hypoxia we will continue with the T-piece titrate oxygen as tolerated 2. COVID-19 virus infection recovery 3. Pneumonia due to COVID-19 treated 4. History of pulmonary embolism no change 5. Severe sepsis resolved   I have personally seen and evaluated the patient, evaluated laboratory and imaging results, formulated the assessment and plan and placed orders. The Patient requires high complexity decision making with multiple systems involvement.  Rounds were done with the Respiratory Therapy Director and Staff therapists and discussed with nursing staff also.  [name], MD [facility] Pulmonary Critical Care Medicine Sleep Medicine

## 2020-10-26 DIAGNOSIS — U071 COVID-19: Secondary | ICD-10-CM | POA: Diagnosis not present

## 2020-10-26 DIAGNOSIS — J9621 Acute and chronic respiratory failure with hypoxia: Secondary | ICD-10-CM | POA: Diagnosis not present

## 2020-10-26 DIAGNOSIS — Z86711 Personal history of pulmonary embolism: Secondary | ICD-10-CM | POA: Diagnosis not present

## 2020-10-26 DIAGNOSIS — A419 Sepsis, unspecified organism: Secondary | ICD-10-CM | POA: Diagnosis not present

## 2020-10-26 NOTE — Progress Notes (Signed)
Pulmonary Critical Care Medicine [place]   PULMONARY CRITICAL CARE SERVICE  PROGRESS NOTE  Date of Service: 10/26/2020  Marie Chavez  [id number]:[id number]  DOB: May 31, 1956   DOA: 09/02/2020  Referring Physician: Merton Border, MD  HPI: Marie Chavez is a 65 y.o. female seen for follow up of Acute on Chronic Respiratory Failure.  She remains off the ventilator was apparently attempted at capping she did not do well  Medications: Reviewed on Rounds  Physical Exam:  Vitals: Temperature is 97.0 pulse 89 respiratory rate 16 blood pressure is 116/67 saturations 96%  Ventilator Settings on T collar FiO2 35%  . General: Comfortable at this time . Eyes: Grossly normal lids, irises & conjunctiva . ENT: grossly tongue is normal . Neck: no obvious mass . Cardiovascular: S1 S2 normal no gallop . Respiratory: No rhonchi no rales noted at this time . Abdomen: soft . Skin: no rash seen on limited exam . Musculoskeletal: not rigid . Psychiatric:unable to assess . Neurologic: no seizure no involuntary movements         Lab Data:   Basic Metabolic Panel: Recent Labs  Lab 10/23/20 0323 10/24/20 0429  NA 142  --   K 3.2* 3.7  CL 99  --   CO2 32  --   GLUCOSE 186*  --   BUN 30*  --   CREATININE 0.43*  --   CALCIUM 8.7*  --   MG 2.3  --   PHOS 3.2  --     ABG: No results for input(s): PHART, PCO2ART, PO2ART, HCO3, O2SAT in the last 168 hours.  Liver Function Tests: Recent Labs  Lab 10/23/20 0323  ALBUMIN 3.1*   No results for input(s): LIPASE, AMYLASE in the last 168 hours. No results for input(s): AMMONIA in the last 168 hours.  CBC: Recent Labs  Lab 10/23/20 0323  WBC 7.2  HGB 8.3*  HCT 27.0*  MCV 98.5  PLT 138*    Cardiac Enzymes: No results for input(s): CKTOTAL, CKMB, CKMBINDEX, TROPONINI in the last 168 hours.  BNP (last 3 results) Recent Labs    09/09/20 0623  BNP 137.4*    ProBNP (last 3 results) No results for input(s):  PROBNP in the last 8760 hours.  Radiological Exams: No results found.  Assessment/Plan Active Problems:   Acute on chronic respiratory failure with hypoxia ([place])   COVID-19 virus infection   Pneumonia due to COVID-19 virus   History of pulmonary embolism   Severe sepsis ([place])   1. Acute on chronic respiratory failure hypoxia we will continue on T collar titrate oxygen continue pulmonary toilet. 2. COVID-19 virus infection recovery phase we will continue to monitor 3. Pneumonia due to COVID-19 treated 4. History of pulmonary embolism at baseline we will continue to follow 5. Severe sepsis resolved   I have personally seen and evaluated the patient, evaluated laboratory and imaging results, formulated the assessment and plan and placed orders. The Patient requires high complexity decision making with multiple systems involvement.  Rounds were done with the Respiratory Therapy Director and Staff therapists and discussed with nursing staff also.  [name], MD [facility] Pulmonary Critical Care Medicine Sleep Medicine

## 2020-10-27 DIAGNOSIS — J9621 Acute and chronic respiratory failure with hypoxia: Secondary | ICD-10-CM | POA: Diagnosis not present

## 2020-10-27 DIAGNOSIS — Z86711 Personal history of pulmonary embolism: Secondary | ICD-10-CM | POA: Diagnosis not present

## 2020-10-27 DIAGNOSIS — A419 Sepsis, unspecified organism: Secondary | ICD-10-CM | POA: Diagnosis not present

## 2020-10-27 DIAGNOSIS — U071 COVID-19: Secondary | ICD-10-CM | POA: Diagnosis not present

## 2020-10-27 NOTE — Progress Notes (Signed)
Pulmonary Critical Care Medicine [place]   PULMONARY CRITICAL CARE SERVICE  PROGRESS NOTE  Date of Service: 10/27/2020  Marie Chavez  ZJI:[id number]  DOB: 15-Feb-1956   DOA: 09/02/2020  Referring Physician: Merton Border, MD  HPI: Marie Chavez is a 66 y.o. female seen for follow up of Acute on Chronic Respiratory Failure.  Patient is on T collar currently on 28% FiO2 using the PMV  Medications: Reviewed on Rounds  Physical Exam:  Vitals: Temperature is 97.2 pulse 85 respiratory rate is 14 blood pressure is 140/78 saturations 99%  Ventilator Settings on T collar FiO2 28%  . General: Comfortable at this time . Eyes: Grossly normal lids, irises & conjunctiva . ENT: grossly tongue is normal . Neck: no obvious mass . Cardiovascular: S1 S2 normal no gallop . Respiratory: No rales . Abdomen: soft . Skin: no rash seen on limited exam . Musculoskeletal: not rigid . Psychiatric:unable to assess . Neurologic: no seizure no involuntary movements         Lab Data:   Basic Metabolic Panel: Recent Labs  Lab 10/23/20 0323 10/24/20 0429  NA 142  --   K 3.2* 3.7  CL 99  --   CO2 32  --   GLUCOSE 186*  --   BUN 30*  --   CREATININE 0.43*  --   CALCIUM 8.7*  --   MG 2.3  --   PHOS 3.2  --     ABG: No results for input(s): PHART, PCO2ART, PO2ART, HCO3, O2SAT in the last 168 hours.  Liver Function Tests: Recent Labs  Lab 10/23/20 0323  ALBUMIN 3.1*   No results for input(s): LIPASE, AMYLASE in the last 168 hours. No results for input(s): AMMONIA in the last 168 hours.  CBC: Recent Labs  Lab 10/23/20 0323  WBC 7.2  HGB 8.3*  HCT 27.0*  MCV 98.5  PLT 138*    Cardiac Enzymes: No results for input(s): CKTOTAL, CKMB, CKMBINDEX, TROPONINI in the last 168 hours.  BNP (last 3 results) Recent Labs    09/09/20 0623  BNP 137.4*    ProBNP (last 3 results) No results for input(s): PROBNP in the last 8760 hours.  Radiological Exams: No  results found.  Assessment/Plan Active Problems:   Acute on chronic respiratory failure with hypoxia ([place])   COVID-19 virus infection   Pneumonia due to COVID-19 virus   History of pulmonary embolism   Severe sepsis ([place])   1. Acute on chronic respiratory failure with hypoxia we will try capping her today. 2. COVID-19 virus infection in recovery we will continue to follow 3. Pneumonia due to COVID-19 treated improving 4. History of pulmonary embolism treated 5. Severe sepsis resolved   I have personally seen and evaluated the patient, evaluated laboratory and imaging results, formulated the assessment and plan and placed orders. The Patient requires high complexity decision making with multiple systems involvement.  Rounds were done with the Respiratory Therapy Director and Staff therapists and discussed with nursing staff also.  [name], MD [facility] Pulmonary Critical Care Medicine Sleep Medicine

## 2020-10-28 DIAGNOSIS — A419 Sepsis, unspecified organism: Secondary | ICD-10-CM | POA: Diagnosis not present

## 2020-10-28 DIAGNOSIS — Z86711 Personal history of pulmonary embolism: Secondary | ICD-10-CM | POA: Diagnosis not present

## 2020-10-28 DIAGNOSIS — J9621 Acute and chronic respiratory failure with hypoxia: Secondary | ICD-10-CM | POA: Diagnosis not present

## 2020-10-28 DIAGNOSIS — U071 COVID-19: Secondary | ICD-10-CM | POA: Diagnosis not present

## 2020-10-28 NOTE — Progress Notes (Signed)
Pulmonary Critical Care Medicine [place]   PULMONARY CRITICAL CARE SERVICE  PROGRESS NOTE  Date of Service: 10/28/2020  Marie Chavez  [id number]  DOB: 12-03-1955   DOA: 09/02/2020  Referring Physician: Merton Border, MD  HPI: Marie Chavez is a 65 y.o. female seen for follow up of Acute on Chronic Respiratory Failure.  Patient currently is on T collar has been on 28% FiO2 using PMV was attempted on capping trials but did not do well so I discussed changing the trach out to a regular Shiley trach today  Medications: Reviewed on Rounds  Physical Exam:  Vitals: Temperature is 98.1 pulse 86 respiratory rate 27 blood pressure is 147/75 saturations 93%  Ventilator Settings on T collar with an FiO2 of 28%  . General: Comfortable at this time . Eyes: Grossly normal lids, irises & conjunctiva . ENT: grossly tongue is normal . Neck: no obvious mass . Cardiovascular: S1 S2 normal no gallop . Respiratory: No rhonchi no rales are noted at this time . Abdomen: soft . Skin: no rash seen on limited exam . Musculoskeletal: not rigid . Psychiatric:unable to assess . Neurologic: no seizure no involuntary movements         Lab Data:   Basic Metabolic Panel: Recent Labs  Lab 10/23/20 0323 10/24/20 0429  NA 142  --   K 3.2* 3.7  CL 99  --   CO2 32  --   GLUCOSE 186*  --   BUN 30*  --   CREATININE 0.43*  --   CALCIUM 8.7*  --   MG 2.3  --   PHOS 3.2  --     ABG: No results for input(s): PHART, PCO2ART, PO2ART, HCO3, O2SAT in the last 168 hours.  Liver Function Tests: Recent Labs  Lab 10/23/20 0323  ALBUMIN 3.1*   No results for input(s): LIPASE, AMYLASE in the last 168 hours. No results for input(s): AMMONIA in the last 168 hours.  CBC: Recent Labs  Lab 10/23/20 0323  WBC 7.2  HGB 8.3*  HCT 27.0*  MCV 98.5  PLT 138*    Cardiac Enzymes: No results for input(s): CKTOTAL, CKMB, CKMBINDEX, TROPONINI in the last 168 hours.  BNP (last 3  results) Recent Labs    09/09/20 0623  BNP 137.4*    ProBNP (last 3 results) No results for input(s): PROBNP in the last 8760 hours.  Radiological Exams: No results found.  Assessment/Plan Active Problems:   Acute on chronic respiratory failure with hypoxia ([place])   COVID-19 virus infection   Pneumonia due to COVID-19 virus   History of pulmonary embolism   Severe sepsis ([place])   1. Acute on chronic respiratory failure hypoxia we will continue with T-piece titrate oxygen continue pulmonary toilet.  We will change the trach out to a #6 cuffless Shiley as opposed to XLT 2. COVID-19 virus infection recovery 3. Pneumonia due to COVID-19 treated 4. History of pulmonary embolism at baseline 5. Severe sepsis resolved   I have personally seen and evaluated the patient, evaluated laboratory and imaging results, formulated the assessment and plan and placed orders. The Patient requires high complexity decision making with multiple systems involvement.  Rounds were done with the Respiratory Therapy Director and Staff therapists and discussed with nursing staff also.  [name], MD [facility] Pulmonary Critical Care Medicine Sleep Medicine

## 2020-10-29 ENCOUNTER — Other Ambulatory Visit (HOSPITAL_COMMUNITY): Payer: Medicare Other

## 2020-10-29 DIAGNOSIS — A419 Sepsis, unspecified organism: Secondary | ICD-10-CM | POA: Diagnosis not present

## 2020-10-29 DIAGNOSIS — J9621 Acute and chronic respiratory failure with hypoxia: Secondary | ICD-10-CM | POA: Diagnosis not present

## 2020-10-29 DIAGNOSIS — U071 COVID-19: Secondary | ICD-10-CM | POA: Diagnosis not present

## 2020-10-29 DIAGNOSIS — Z86711 Personal history of pulmonary embolism: Secondary | ICD-10-CM | POA: Diagnosis not present

## 2020-10-29 LAB — BASIC METABOLIC PANEL
Anion gap: 16 — ABNORMAL HIGH (ref 5–15)
BUN: 56 mg/dL — ABNORMAL HIGH (ref 8–23)
CO2: 31 mmol/L (ref 22–32)
Calcium: 9.6 mg/dL (ref 8.9–10.3)
Chloride: 95 mmol/L — ABNORMAL LOW (ref 98–111)
Creatinine, Ser: 0.52 mg/dL (ref 0.44–1.00)
GFR, Estimated: >60 mL/min (ref >60)
Glucose, Bld: 252 mg/dL — ABNORMAL HIGH (ref 70–99)
Potassium: 3.3 mmol/L — ABNORMAL LOW (ref 3.5–5.1)
Sodium: 142 mmol/L (ref 135–145)

## 2020-10-29 LAB — CBC
HCT: 32.4 % — ABNORMAL LOW (ref 36.0–46.0)
Hemoglobin: 9.6 g/dL — ABNORMAL LOW (ref 12.0–15.0)
MCH: 29.3 pg (ref 26.0–34.0)
MCHC: 29.6 g/dL — ABNORMAL LOW (ref 30.0–36.0)
MCV: 98.8 fL (ref 80.0–100.0)
Platelets: 170 10*3/uL (ref 150–400)
RBC: 3.28 MIL/uL — ABNORMAL LOW (ref 3.87–5.11)
RDW: 17.5 % — ABNORMAL HIGH (ref 11.5–15.5)
WBC: 7.3 10*3/uL (ref 4.0–10.5)
nRBC: 1 % — ABNORMAL HIGH (ref 0.0–0.2)

## 2020-10-29 NOTE — Progress Notes (Signed)
Pulmonary Critical Care Medicine [place]   PULMONARY CRITICAL CARE SERVICE  PROGRESS NOTE  Date of Service: 10/29/2020  Marie Chavez  [id number]  DOB: 02/23/1956   DOA: 09/02/2020  Referring Physician: Merton Border, MD  HPI: Marie Chavez is a 65 y.o. female seen for follow up of Acute on Chronic Respiratory Failure.  Patient is on T collar currently on 28% FiO2 using the PMV  Medications: Reviewed on Rounds  Physical Exam:  Vitals: Temperature is 97.0 pulse 83 respiratory rate 15 blood pressure is 128/67 saturations 99%  Ventilator Settings on T collar with an FiO2 of 28% using PMV  . General: Comfortable at this time . Eyes: Grossly normal lids, irises & conjunctiva . ENT: grossly tongue is normal . Neck: no obvious mass . Cardiovascular: S1 S2 normal no gallop . Respiratory: Scattered rhonchi expansion is equal . Abdomen: soft . Skin: no rash seen on limited exam . Musculoskeletal: not rigid . Psychiatric:unable to assess . Neurologic: no seizure no involuntary movements         Lab Data:   Basic Metabolic Panel: Recent Labs  Lab 10/23/20 0323 10/24/20 0429 10/29/20 0625  NA 142  --  142  K 3.2* 3.7 3.3*  CL 99  --  95*  CO2 32  --  31  GLUCOSE 186*  --  252*  BUN 30*  --  56*  CREATININE 0.43*  --  0.52  CALCIUM 8.7*  --  9.6  MG 2.3  --   --   PHOS 3.2  --   --     ABG: No results for input(s): PHART, PCO2ART, PO2ART, HCO3, O2SAT in the last 168 hours.  Liver Function Tests: Recent Labs  Lab 10/23/20 0323  ALBUMIN 3.1*   No results for input(s): LIPASE, AMYLASE in the last 168 hours. No results for input(s): AMMONIA in the last 168 hours.  CBC: Recent Labs  Lab 10/23/20 0323 10/29/20 0625  WBC 7.2 7.3  HGB 8.3* 9.6*  HCT 27.0* 32.4*  MCV 98.5 98.8  PLT 138* 170    Cardiac Enzymes: No results for input(s): CKTOTAL, CKMB, CKMBINDEX, TROPONINI in the last 168 hours.  BNP (last 3 results) Recent Labs     09/09/20 0623  BNP 137.4*    ProBNP (last 3 results) No results for input(s): PROBNP in the last 8760 hours.  Radiological Exams: DG CHEST PORT 1 VIEW  Result Date: 10/29/2020 CLINICAL DATA:  Respiratory failure.  History of COVID. EXAM: PORTABLE CHEST 1 VIEW COMPARISON:  10/23/2020. FINDINGS: Tracheostomy tube in stable position. Cardiomegaly. Low lung volumes. Progressive bilateral pulmonary infiltrates/edema. Small left pleural effusion cannot be excluded. No pneumothorax. Old left rib fractures again noted. Prior cervical spine fusion. IMPRESSION: 1. Tracheostomy tube in stable position. 2. Cardiomegaly. 3. Low lung volumes. Progressive bilateral pulmonary infiltrates/edema. Small left pleural effusion cannot be excluded. Electronically Signed   By: Marcello Moores  Register   On: 10/29/2020 07:03    Assessment/Plan Active Problems:   Acute on chronic respiratory failure with hypoxia ([place])   COVID-19 virus infection   Pneumonia due to COVID-19 virus   History of pulmonary embolism   Severe sepsis ([place])   1. Acute on chronic respiratory failure with hypoxia we will continue with the T collar trials use the PMV. 2. COVID-19 virus infection in recovery 3. Pneumonia due to COVID-19 slow improvement low lung volumes with some progressive infiltrates are still noted likely edema will need to monitor fluid status 4.  Severe sepsis treated 5. History of pulmonary embolism has been treated we will monitor   I have personally seen and evaluated the patient, evaluated laboratory and imaging results, formulated the assessment and plan and placed orders. The Patient requires high complexity decision making with multiple systems involvement.  Rounds were done with the Respiratory Therapy Director and Staff therapists and discussed with nursing staff also.  [name], MD [facility] Pulmonary Critical Care Medicine Sleep Medicine

## 2020-10-30 DIAGNOSIS — J9621 Acute and chronic respiratory failure with hypoxia: Secondary | ICD-10-CM | POA: Diagnosis not present

## 2020-10-30 DIAGNOSIS — U071 COVID-19: Secondary | ICD-10-CM | POA: Diagnosis not present

## 2020-10-30 DIAGNOSIS — Z86711 Personal history of pulmonary embolism: Secondary | ICD-10-CM | POA: Diagnosis not present

## 2020-10-30 DIAGNOSIS — A419 Sepsis, unspecified organism: Secondary | ICD-10-CM | POA: Diagnosis not present

## 2020-10-30 LAB — POTASSIUM: Potassium: 3.3 mmol/L — ABNORMAL LOW (ref 3.5–5.1)

## 2020-10-30 NOTE — Progress Notes (Signed)
Pulmonary Critical Care Medicine [place]   PULMONARY CRITICAL CARE SERVICE  PROGRESS NOTE  Date of Service: 10/30/2020  Marie Chavez  [id number]:[id number]  DOB: [birth year]   DOA: 09/02/2020  Referring Physician: Merton Border, MD  HPI: Marie Chavez is a 65 y.o. female seen for follow up of Acute on Chronic Respiratory Failure.  Patient is currently on T collar on 28% using PMV  Medications: Reviewed on Rounds  Physical Exam:  Vitals: Temperature 97.8 pulse 74 respiratory 18 blood pressure is 143/73 saturations 98%  Ventilator Settings off the ventilator on T collar currently  . General: Comfortable at this time . Eyes: Grossly normal lids, irises & conjunctiva . ENT: grossly tongue is normal . Neck: no obvious mass . Cardiovascular: S1 S2 normal no gallop . Respiratory: No rhonchi very coarse breath sounds . Abdomen: soft . Skin: no rash seen on limited exam . Musculoskeletal: not rigid . Psychiatric:unable to assess . Neurologic: no seizure no involuntary movements         Lab Data:   Basic Metabolic Panel: Recent Labs  Lab 10/24/20 0429 10/29/20 0625 10/30/20 0305  NA  --  142  --   K 3.7 3.3* 3.3*  CL  --  95*  --   CO2  --  31  --   GLUCOSE  --  252*  --   BUN  --  56*  --   CREATININE  --  0.52  --   CALCIUM  --  9.6  --     ABG: No results for input(s): PHART, PCO2ART, PO2ART, HCO3, O2SAT in the last 168 hours.  Liver Function Tests: No results for input(s): AST, ALT, ALKPHOS, BILITOT, PROT, ALBUMIN in the last 168 hours. No results for input(s): LIPASE, AMYLASE in the last 168 hours. No results for input(s): AMMONIA in the last 168 hours.  CBC: Recent Labs  Lab 10/29/20 0625  WBC 7.3  HGB 9.6*  HCT 32.4*  MCV 98.8  PLT 170    Cardiac Enzymes: No results for input(s): CKTOTAL, CKMB, CKMBINDEX, TROPONINI in the last 168 hours.  BNP (last 3 results) Recent Labs    09/09/20 0623  BNP 137.4*    ProBNP (last 3  results) No results for input(s): PROBNP in the last 8760 hours.  Radiological Exams: DG CHEST PORT 1 VIEW  Result Date: 10/29/2020 CLINICAL DATA:  Respiratory failure.  History of COVID. EXAM: PORTABLE CHEST 1 VIEW COMPARISON:  10/23/2020. FINDINGS: Tracheostomy tube in stable position. Cardiomegaly. Low lung volumes. Progressive bilateral pulmonary infiltrates/edema. Small left pleural effusion cannot be excluded. No pneumothorax. Old left rib fractures again noted. Prior cervical spine fusion. IMPRESSION: 1. Tracheostomy tube in stable position. 2. Cardiomegaly. 3. Low lung volumes. Progressive bilateral pulmonary infiltrates/edema. Small left pleural effusion cannot be excluded. Electronically Signed   By: Marcello Moores  Register   On: 10/29/2020 07:03    Assessment/Plan Active Problems:   Acute on chronic respiratory failure with hypoxia ([place])   COVID-19 virus infection   Pneumonia due to COVID-19 virus   History of pulmonary embolism   Severe sepsis ([place])   1. Acute on chronic respiratory failure with hypoxia we will continue with the T collar and titrate oxygen as tolerated.  Patient's been doing well with the PMV suggested a ENT evaluation because of failure to do capping and also speech therapy wants to assess swallowing 2. COVID-19 virus infection and recovery 3. Pneumonia due to COVID-19 treated 4. History of pulmonary embolism milligrams  at baseline 5. Severe sepsis resolved   I have personally seen and evaluated the patient, evaluated laboratory and imaging results, formulated the assessment and plan and placed orders. The Patient requires high complexity decision making with multiple systems involvement.  Rounds were done with the Respiratory Therapy Director and Staff therapists and discussed with nursing staff also.  [name], MD [facility] Pulmonary Critical Care Medicine Sleep Medicine

## 2020-10-31 DIAGNOSIS — U071 COVID-19: Secondary | ICD-10-CM | POA: Diagnosis not present

## 2020-10-31 DIAGNOSIS — Z86711 Personal history of pulmonary embolism: Secondary | ICD-10-CM | POA: Diagnosis not present

## 2020-10-31 DIAGNOSIS — A419 Sepsis, unspecified organism: Secondary | ICD-10-CM | POA: Diagnosis not present

## 2020-10-31 DIAGNOSIS — J9621 Acute and chronic respiratory failure with hypoxia: Secondary | ICD-10-CM | POA: Diagnosis not present

## 2020-10-31 LAB — SARS CORONAVIRUS 2 (TAT 6-24 HRS): SARS Coronavirus 2: POSITIVE — AB

## 2020-10-31 NOTE — Progress Notes (Signed)
Pulmonary Critical Care Medicine [place]   PULMONARY CRITICAL CARE SERVICE  PROGRESS NOTE  Date of Service: 10/31/2020  Marie Chavez  [id number]  DOB: 29-Dec-1955   DOA: 09/02/2020  Referring Physician: Merton Border, MD  HPI: Marie Chavez is a 65 y.o. female seen for follow up of Acute on Chronic Respiratory Failure.  Patient is currently on T collar has been on 28% FiO2 apparently tested positive for COVID  Medications: Reviewed on Rounds  Physical Exam:  Vitals: Temperature 96.1 pulse 96 respiratory rate 16 blood pressure is 155/77 saturations 96%  Ventilator Settings on T collar FiO2 of 28%  . General: Comfortable at this time . Eyes: Grossly normal lids, irises & conjunctiva . ENT: grossly tongue is normal . Neck: no obvious mass . Cardiovascular: S1 S2 normal no gallop . Respiratory: No rhonchi no rales noted . Abdomen: soft . Skin: no rash seen on limited exam . Musculoskeletal: not rigid . Psychiatric:unable to assess . Neurologic: no seizure no involuntary movements         Lab Data:   Basic Metabolic Panel: Recent Labs  Lab 10/29/20 0625 10/30/20 0305  NA 142  --   K 3.3* 3.3*  CL 95*  --   CO2 31  --   GLUCOSE 252*  --   BUN 56*  --   CREATININE 0.52  --   CALCIUM 9.6  --     ABG: No results for input(s): PHART, PCO2ART, PO2ART, HCO3, O2SAT in the last 168 hours.  Liver Function Tests: No results for input(s): AST, ALT, ALKPHOS, BILITOT, PROT, ALBUMIN in the last 168 hours. No results for input(s): LIPASE, AMYLASE in the last 168 hours. No results for input(s): AMMONIA in the last 168 hours.  CBC: Recent Labs  Lab 10/29/20 0625  WBC 7.3  HGB 9.6*  HCT 32.4*  MCV 98.8  PLT 170    Cardiac Enzymes: No results for input(s): CKTOTAL, CKMB, CKMBINDEX, TROPONINI in the last 168 hours.  BNP (last 3 results) Recent Labs    09/09/20 0623  BNP 137.4*    ProBNP (last 3 results) No results for input(s):  PROBNP in the last 8760 hours.  Radiological Exams: No results found.  Assessment/Plan Active Problems:   Acute on chronic respiratory failure with hypoxia ([place])   COVID-19 virus infection   Pneumonia due to COVID-19 virus   History of pulmonary embolism   Severe sepsis ([place])   1. Acute on chronic respiratory failure with hypoxia we will continue with the T collar for now patient's back in isolation because of COVID positivity 2. COVID-19 virus infection has been in recovery but tested positive thanks again 3. Pneumonia due to COVID-19 treated resolved 4. History of pulmonary embolism treated 5. Severe sepsis resolved   I have personally seen and evaluated the patient, evaluated laboratory and imaging results, formulated the assessment and plan and placed orders. The Patient requires high complexity decision making with multiple systems involvement.  Rounds were done with the Respiratory Therapy Director and Staff therapists and discussed with nursing staff also.  [name], MD [facility] Pulmonary Critical Care Medicine Sleep Medicine

## 2020-11-01 DIAGNOSIS — U071 COVID-19: Secondary | ICD-10-CM | POA: Diagnosis not present

## 2020-11-01 DIAGNOSIS — J9621 Acute and chronic respiratory failure with hypoxia: Secondary | ICD-10-CM | POA: Diagnosis not present

## 2020-11-01 DIAGNOSIS — A419 Sepsis, unspecified organism: Secondary | ICD-10-CM | POA: Diagnosis not present

## 2020-11-01 DIAGNOSIS — Z86711 Personal history of pulmonary embolism: Secondary | ICD-10-CM | POA: Diagnosis not present

## 2020-11-01 LAB — POTASSIUM: Potassium: 4.4 mmol/L (ref 3.5–5.1)

## 2020-11-01 NOTE — Progress Notes (Signed)
Pulmonary Critical Care Medicine [place]   PULMONARY CRITICAL CARE SERVICE  PROGRESS NOTE  Date of Service: 11/01/2020  VELA RENDER  [id number]  DOB: 15-Nov-1955   DOA: 09/02/2020  Referring Physician: Merton Border, MD  HPI: JACKLYNE BAIK is a 65 y.o. female seen for follow up of Acute on Chronic Respiratory Failure.  Patient at this time is on T collar has been on 28% FiO2  Medications: Reviewed on Rounds  Physical Exam:  Vitals: Temperature is 96.6 pulse 77 respiratory rate 17 blood pressure is 115/80 saturations 95%  Ventilator Settings on T collar with an FiO2 of 28%  . General: Comfortable at this time . Eyes: Grossly normal lids, irises & conjunctiva . ENT: grossly tongue is normal . Neck: no obvious mass . Cardiovascular: S1 S2 normal no gallop . Respiratory: No rhonchi very coarse breath sounds . Abdomen: soft . Skin: no rash seen on limited exam . Musculoskeletal: not rigid . Psychiatric:unable to assess . Neurologic: no seizure no involuntary movements         Lab Data:   Basic Metabolic Panel: Recent Labs  Lab 10/29/20 0625 10/30/20 0305  NA 142  --   K 3.3* 3.3*  CL 95*  --   CO2 31  --   GLUCOSE 252*  --   BUN 56*  --   CREATININE 0.52  --   CALCIUM 9.6  --     ABG: No results for input(s): PHART, PCO2ART, PO2ART, HCO3, O2SAT in the last 168 hours.  Liver Function Tests: No results for input(s): AST, ALT, ALKPHOS, BILITOT, PROT, ALBUMIN in the last 168 hours. No results for input(s): LIPASE, AMYLASE in the last 168 hours. No results for input(s): AMMONIA in the last 168 hours.  CBC: Recent Labs  Lab 10/29/20 0625  WBC 7.3  HGB 9.6*  HCT 32.4*  MCV 98.8  PLT 170    Cardiac Enzymes: No results for input(s): CKTOTAL, CKMB, CKMBINDEX, TROPONINI in the last 168 hours.  BNP (last 3 results) Recent Labs    09/09/20 0623  BNP 137.4*    ProBNP (last 3 results) No results for input(s): PROBNP in the  last 8760 hours.  Radiological Exams: No results found.  Assessment/Plan Active Problems:   Acute on chronic respiratory failure with hypoxia ([place])   COVID-19 virus infection   Pneumonia due to COVID-19 virus   History of pulmonary embolism   Severe sepsis ([place])   1. Acute on chronic respiratory failure with hypoxia we will continue with T collar trials patient currently is on 28% FiO2. 2. COVID-19 virus infection recovery 3. Pneumonia due to COVID-19 treated 4. History of pulmonary embolism treated improving 5. Severe sepsis resolved   I have personally seen and evaluated the patient, evaluated laboratory and imaging results, formulated the assessment and plan and placed orders. The Patient requires high complexity decision making with multiple systems involvement.  Rounds were done with the Respiratory Therapy Director and Staff therapists and discussed with nursing staff also.  [name], MD [facility] Pulmonary Critical Care Medicine Sleep Medicine

## 2020-11-02 DIAGNOSIS — Z86711 Personal history of pulmonary embolism: Secondary | ICD-10-CM | POA: Diagnosis not present

## 2020-11-02 DIAGNOSIS — J9621 Acute and chronic respiratory failure with hypoxia: Secondary | ICD-10-CM | POA: Diagnosis not present

## 2020-11-02 DIAGNOSIS — U071 COVID-19: Secondary | ICD-10-CM | POA: Diagnosis not present

## 2020-11-02 DIAGNOSIS — A419 Sepsis, unspecified organism: Secondary | ICD-10-CM | POA: Diagnosis not present

## 2020-11-02 LAB — BASIC METABOLIC PANEL
Anion gap: 13 (ref 5–15)
BUN: 49 mg/dL — ABNORMAL HIGH (ref 8–23)
CO2: 32 mmol/L (ref 22–32)
Calcium: 9.1 mg/dL (ref 8.9–10.3)
Chloride: 98 mmol/L (ref 98–111)
Creatinine, Ser: 0.47 mg/dL (ref 0.44–1.00)
GFR, Estimated: >60 mL/min (ref >60)
Glucose, Bld: 222 mg/dL — ABNORMAL HIGH (ref 70–99)
Potassium: 3.2 mmol/L — ABNORMAL LOW (ref 3.5–5.1)
Sodium: 143 mmol/L (ref 135–145)

## 2020-11-02 NOTE — Progress Notes (Signed)
Pulmonary Critical Care Medicine [place]   PULMONARY CRITICAL CARE SERVICE  PROGRESS NOTE  Date of Service: 11/02/2020  Marie Chavez  [id number]  DOB: Sep 14, 1956   DOA: 09/02/2020  Referring Physician: Merton Border, MD  HPI: Marie Chavez is a 65 y.o. female seen for follow up of Acute on Chronic Respiratory Failure.  Patient at this time is on T collar has been on 28% FiO2 good PMV usage  Medications: Reviewed on Rounds  Physical Exam:  Vitals: Temperature is 96.1 pulse 86 respiratory rate is 18 blood pressure is 115/66 saturations 99%  Ventilator Settings on T collar FiO2 28%  . General: Comfortable at this time . Eyes: Grossly normal lids, irises & conjunctiva . ENT: grossly tongue is normal . Neck: no obvious mass . Cardiovascular: S1 S2 normal no gallop . Respiratory: No rhonchi very coarse breath . Abdomen: soft . Skin: no rash seen on limited exam . Musculoskeletal: not rigid . Psychiatric:unable to assess . Neurologic: no seizure no involuntary movements         Lab Data:   Basic Metabolic Panel: Recent Labs  Lab 10/29/20 0625 10/30/20 0305 11/01/20 1403 11/02/20 0334  NA 142  --   --  143  K 3.3* 3.3* 4.4 3.2*  CL 95*  --   --  98  CO2 31  --   --  32  GLUCOSE 252*  --   --  222*  BUN 56*  --   --  49*  CREATININE 0.52  --   --  0.47  CALCIUM 9.6  --   --  9.1    ABG: No results for input(s): PHART, PCO2ART, PO2ART, HCO3, O2SAT in the last 168 hours.  Liver Function Tests: No results for input(s): AST, ALT, ALKPHOS, BILITOT, PROT, ALBUMIN in the last 168 hours. No results for input(s): LIPASE, AMYLASE in the last 168 hours. No results for input(s): AMMONIA in the last 168 hours.  CBC: Recent Labs  Lab 10/29/20 0625  WBC 7.3  HGB 9.6*  HCT 32.4*  MCV 98.8  PLT 170    Cardiac Enzymes: No results for input(s): CKTOTAL, CKMB, CKMBINDEX, TROPONINI in the last 168 hours.  BNP (last 3 results) Recent Labs     09/09/20 0623  BNP 137.4*    ProBNP (last 3 results) No results for input(s): PROBNP in the last 8760 hours.  Radiological Exams: No results found.  Assessment/Plan Active Problems:   Acute on chronic respiratory failure with hypoxia ([place])   COVID-19 virus infection   Pneumonia due to COVID-19 virus   History of pulmonary embolism   Severe sepsis ([place])   1. Acute on chronic respiratory failure hypoxia we will continue with T collar titrate oxygen continue pulmonary toilet 2. COVID-19 virus infection recovery we will continue to follow 3. Pneumonia due to COVID-19 treated we will continue with supportive care 4. History of pulmonary embolism at baseline 5. Severe sepsis resolved   I have personally seen and evaluated the patient, evaluated laboratory and imaging results, formulated the assessment and plan and placed orders. The Patient requires high complexity decision making with multiple systems involvement.  Rounds were done with the Respiratory Therapy Director and Staff therapists and discussed with nursing staff also.  [name], MD [facility] Pulmonary Critical Care Medicine Sleep Medicine

## 2020-11-03 ENCOUNTER — Other Ambulatory Visit (HOSPITAL_COMMUNITY): Payer: Medicare Other

## 2020-11-03 DIAGNOSIS — J9621 Acute and chronic respiratory failure with hypoxia: Secondary | ICD-10-CM | POA: Diagnosis not present

## 2020-11-03 DIAGNOSIS — U071 COVID-19: Secondary | ICD-10-CM | POA: Diagnosis not present

## 2020-11-03 DIAGNOSIS — A419 Sepsis, unspecified organism: Secondary | ICD-10-CM | POA: Diagnosis not present

## 2020-11-03 DIAGNOSIS — Z86711 Personal history of pulmonary embolism: Secondary | ICD-10-CM | POA: Diagnosis not present

## 2020-11-03 LAB — NOVEL CORONAVIRUS, NAA (HOSP ORDER, SEND-OUT TO REF LAB; TAT 18-24 HRS): SARS-CoV-2, NAA: NOT DETECTED

## 2020-11-03 LAB — POTASSIUM: Potassium: 2.8 mmol/L — ABNORMAL LOW (ref 3.5–5.1)

## 2020-11-03 NOTE — Progress Notes (Signed)
Pulmonary Critical Care Medicine [place]   PULMONARY CRITICAL CARE SERVICE  PROGRESS NOTE  Date of Service: 11/03/2020  Marie Chavez  [id number]:[id number]  DOB: December 08, 1955   DOA: 09/02/2020  Referring Physician: Merton Border, MD  HPI: Marie Chavez is a 65 y.o. female seen for follow up of Acute on Chronic Respiratory Failure.  Patient currently is on T collar has been on 28% FiO2 good saturations noted  Medications: Reviewed on Rounds  Physical Exam:  Vitals: Temperature is 96.0 pulse 88 respiratory rate 14 blood pressure is 146/70 saturations 98%  Ventilator Settings on T collar with an FiO2 of 28%  . General: Comfortable at this time . Eyes: Grossly normal lids, irises & conjunctiva . ENT: grossly tongue is normal . Neck: no obvious mass . Cardiovascular: S1 S2 normal no gallop . Respiratory: No rhonchi very coarse breath sounds . Abdomen: soft . Skin: no rash seen on limited exam . Musculoskeletal: not rigid . Psychiatric:unable to assess . Neurologic: no seizure no involuntary movements         Lab Data:   Basic Metabolic Panel: Recent Labs  Lab 10/29/20 0625 10/30/20 0305 11/01/20 1403 11/02/20 0334  NA 142  --   --  143  K 3.3* 3.3* 4.4 3.2*  CL 95*  --   --  98  CO2 31  --   --  32  GLUCOSE 252*  --   --  222*  BUN 56*  --   --  49*  CREATININE 0.52  --   --  0.47  CALCIUM 9.6  --   --  9.1    ABG: No results for input(s): PHART, PCO2ART, PO2ART, HCO3, O2SAT in the last 168 hours.  Liver Function Tests: No results for input(s): AST, ALT, ALKPHOS, BILITOT, PROT, ALBUMIN in the last 168 hours. No results for input(s): LIPASE, AMYLASE in the last 168 hours. No results for input(s): AMMONIA in the last 168 hours.  CBC: Recent Labs  Lab 10/29/20 0625  WBC 7.3  HGB 9.6*  HCT 32.4*  MCV 98.8  PLT 170    Cardiac Enzymes: No results for input(s): CKTOTAL, CKMB, CKMBINDEX, TROPONINI in the last 168 hours.  BNP (last 3  results) Recent Labs    09/09/20 0623  BNP 137.4*    ProBNP (last 3 results) No results for input(s): PROBNP in the last 8760 hours.  Radiological Exams: No results found.  Assessment/Plan Active Problems:   Acute on chronic respiratory failure with hypoxia ([place])   COVID-19 virus infection   Pneumonia due to COVID-19 virus   History of pulmonary embolism   Severe sepsis ([place])   1. Acute on chronic respiratory failure hypoxia continue with T collar trials titrate oxygen continue pulmonary toilet. 2. COVID-19 virus infection recovery phase 3. Pneumonia due to COVID-19 treated 4. Pulmonary embolism at baseline 5. Severe sepsis resolved   I have personally seen and evaluated the patient, evaluated laboratory and imaging results, formulated the assessment and plan and placed orders. The Patient requires high complexity decision making with multiple systems involvement.  Rounds were done with the Respiratory Therapy Director and Staff therapists and discussed with nursing staff also.  [name], MD [facility] Pulmonary Critical Care Medicine Sleep Medicine

## 2020-11-04 DIAGNOSIS — Z86711 Personal history of pulmonary embolism: Secondary | ICD-10-CM | POA: Diagnosis not present

## 2020-11-04 DIAGNOSIS — J9621 Acute and chronic respiratory failure with hypoxia: Secondary | ICD-10-CM | POA: Diagnosis not present

## 2020-11-04 DIAGNOSIS — U071 COVID-19: Secondary | ICD-10-CM | POA: Diagnosis not present

## 2020-11-04 DIAGNOSIS — A419 Sepsis, unspecified organism: Secondary | ICD-10-CM | POA: Diagnosis not present

## 2020-11-04 LAB — POTASSIUM: Potassium: 3.4 mmol/L — ABNORMAL LOW (ref 3.5–5.1)

## 2020-11-04 NOTE — Progress Notes (Signed)
Pulmonary Critical Care Medicine [place]   PULMONARY CRITICAL CARE SERVICE  PROGRESS NOTE  Date of Service: 11/04/2020  Marie Chavez  [id number]  DOB: Oct 26, 1955   DOA: 09/02/2020  Referring Physician: Merton Border, MD  HPI: Marie Chavez is a 65 y.o. female seen for follow up of Acute on Chronic Respiratory Failure.  Patient currently is on T collar has been on 28% FiO2 secretions are fair to moderate  Medications: Reviewed on Rounds  Physical Exam:  Vitals: Temperature 96.6 pulse 89 respiratory rate 24 blood pressure is 127/86 saturations 94%  Ventilator Settings on T collar with an FiO2 of 28%  . General: Comfortable at this time . Eyes: Grossly normal lids, irises & conjunctiva . ENT: grossly tongue is normal . Neck: no obvious mass . Cardiovascular: S1 S2 normal no gallop . Respiratory: Scattered rhonchi expansion is equal . Abdomen: soft . Skin: no rash seen on limited exam . Musculoskeletal: not rigid . Psychiatric:unable to assess . Neurologic: no seizure no involuntary movements         Lab Data:   Basic Metabolic Panel: Recent Labs  Lab 10/29/20 0625 10/30/20 0305 11/01/20 1403 11/02/20 0334 11/03/20 0938 11/04/20 0648  NA 142  --   --  143  --   --   K 3.3* 3.3* 4.4 3.2* 2.8* 3.4*  CL 95*  --   --  98  --   --   CO2 31  --   --  32  --   --   GLUCOSE 252*  --   --  222*  --   --   BUN 56*  --   --  49*  --   --   CREATININE 0.52  --   --  0.47  --   --   CALCIUM 9.6  --   --  9.1  --   --     ABG: No results for input(s): PHART, PCO2ART, PO2ART, HCO3, O2SAT in the last 168 hours.  Liver Function Tests: No results for input(s): AST, ALT, ALKPHOS, BILITOT, PROT, ALBUMIN in the last 168 hours. No results for input(s): LIPASE, AMYLASE in the last 168 hours. No results for input(s): AMMONIA in the last 168 hours.  CBC: Recent Labs  Lab 10/29/20 0625  WBC 7.3  HGB 9.6*  HCT 32.4*  MCV 98.8  PLT 170     Cardiac Enzymes: No results for input(s): CKTOTAL, CKMB, CKMBINDEX, TROPONINI in the last 168 hours.  BNP (last 3 results) Recent Labs    09/09/20 0623  BNP 137.4*    ProBNP (last 3 results) No results for input(s): PROBNP in the last 8760 hours.  Radiological Exams: No results found.  Assessment/Plan Active Problems:   Acute on chronic respiratory failure with hypoxia ([place])   COVID-19 virus infection   Pneumonia due to COVID-19 virus   History of pulmonary embolism   Severe sepsis ([place])   1. Acute on chronic respiratory failure with hypoxia we will continue with T-piece she is at her baseline 2. COVID-19 virus infection resolved 3. Pneumonia due to COVID-19 improved 4. Pulmonary embolism treated 5. Severe sepsis treated and resolved   I have personally seen and evaluated the patient, evaluated laboratory and imaging results, formulated the assessment and plan and placed orders. The Patient requires high complexity decision making with multiple systems involvement.  Rounds were done with the Respiratory Therapy Director and Staff therapists and discussed with nursing staff also.  Tagan Bartram A  Humphrey Rolls, MD [facility] Pulmonary Critical Care Medicine Sleep Medicine

## 2020-11-05 DIAGNOSIS — Z86711 Personal history of pulmonary embolism: Secondary | ICD-10-CM | POA: Diagnosis not present

## 2020-11-05 DIAGNOSIS — J9621 Acute and chronic respiratory failure with hypoxia: Secondary | ICD-10-CM | POA: Diagnosis not present

## 2020-11-05 DIAGNOSIS — U071 COVID-19: Secondary | ICD-10-CM | POA: Diagnosis not present

## 2020-11-05 DIAGNOSIS — A419 Sepsis, unspecified organism: Secondary | ICD-10-CM | POA: Diagnosis not present

## 2020-11-05 LAB — NOVEL CORONAVIRUS, NAA (HOSP ORDER, SEND-OUT TO REF LAB; TAT 18-24 HRS): SARS-CoV-2, NAA: DETECTED — AB

## 2020-11-05 LAB — POTASSIUM: Potassium: 3.3 mmol/L — ABNORMAL LOW (ref 3.5–5.1)

## 2020-11-05 NOTE — Progress Notes (Signed)
Pulmonary Critical Care Medicine [place]   PULMONARY CRITICAL CARE SERVICE  PROGRESS NOTE  Date of Service: 11/05/2020  Marie Chavez  [id number]  DOB: [birth year]   DOA: 09/02/2020  Referring Physician: Merton Border, MD  HPI: Marie Chavez is a 65 y.o. female seen for follow up of Acute on Chronic Respiratory Failure.  Patient currently is on T collar has been on 20% FiO2 using the PMV  Medications: Reviewed on Rounds  Physical Exam:  Vitals: Temperature is 97.6 pulse 84 respiratory rate is 18 blood pressure 114/65 saturations 100%  Ventilator Settings on T collar with an FiO2 of 28%  . General: Comfortable at this time . Eyes: Grossly normal lids, irises & conjunctiva . ENT: grossly tongue is normal . Neck: no obvious mass . Cardiovascular: S1 S2 normal no gallop . Respiratory: No rhonchi very coarse breath sounds . Abdomen: soft . Skin: no rash seen on limited exam . Musculoskeletal: not rigid . Psychiatric:unable to assess . Neurologic: no seizure no involuntary movements         Lab Data:   Basic Metabolic Panel: Recent Labs  Lab 11/01/20 1403 11/02/20 0334 11/03/20 0938 11/04/20 0648 11/05/20 0419  NA  --  143  --   --   --   K 4.4 3.2* 2.8* 3.4* 3.3*  CL  --  98  --   --   --   CO2  --  32  --   --   --   GLUCOSE  --  222*  --   --   --   BUN  --  49*  --   --   --   CREATININE  --  0.47  --   --   --   CALCIUM  --  9.1  --   --   --     ABG: No results for input(s): PHART, PCO2ART, PO2ART, HCO3, O2SAT in the last 168 hours.  Liver Function Tests: No results for input(s): AST, ALT, ALKPHOS, BILITOT, PROT, ALBUMIN in the last 168 hours. No results for input(s): LIPASE, AMYLASE in the last 168 hours. No results for input(s): AMMONIA in the last 168 hours.  CBC: No results for input(s): WBC, NEUTROABS, HGB, HCT, MCV, PLT in the last 168 hours.  Cardiac Enzymes: No results for input(s): CKTOTAL, CKMB, CKMBINDEX,  TROPONINI in the last 168 hours.  BNP (last 3 results) Recent Labs    09/09/20 0623  BNP 137.4*    ProBNP (last 3 results) No results for input(s): PROBNP in the last 8760 hours.  Radiological Exams: No results found.  Assessment/Plan Active Problems:   Acute on chronic respiratory failure with hypoxia ([place])   COVID-19 virus infection   Pneumonia due to COVID-19 virus   History of pulmonary embolism   Severe sepsis ([place])   1. Acute on chronic respiratory failure with hypoxia we will continue with T collar trials currently on 28% FiO2 with PMV in place 2. COVID-19 virus infection recovery we will continue to follow 3. Pneumonia due to COVID-19 treated improving 4. History of pulmonary embolism at baseline 5. Severe sepsis resolved   I have personally seen and evaluated the patient, evaluated laboratory and imaging results, formulated the assessment and plan and placed orders. The Patient requires high complexity decision making with multiple systems involvement.  Rounds were done with the Respiratory Therapy Director and Staff therapists and discussed with nursing staff also.  [name], MD [facility] Pulmonary Critical Care  Medicine Sleep Medicine

## 2020-11-06 DIAGNOSIS — U071 COVID-19: Secondary | ICD-10-CM | POA: Diagnosis not present

## 2020-11-06 DIAGNOSIS — A419 Sepsis, unspecified organism: Secondary | ICD-10-CM | POA: Diagnosis not present

## 2020-11-06 DIAGNOSIS — Z86711 Personal history of pulmonary embolism: Secondary | ICD-10-CM | POA: Diagnosis not present

## 2020-11-06 DIAGNOSIS — J9621 Acute and chronic respiratory failure with hypoxia: Secondary | ICD-10-CM | POA: Diagnosis not present

## 2020-11-06 LAB — POTASSIUM: Potassium: 3.1 mmol/L — ABNORMAL LOW (ref 3.5–5.1)

## 2020-11-06 NOTE — Progress Notes (Signed)
Pulmonary Critical Care Medicine [place]   PULMONARY CRITICAL CARE SERVICE  PROGRESS NOTE  Date of Service: 11/06/2020  Marie Chavez  UKG:[id number]  DOB: [birth year]   DOA: 09/02/2020  Referring Physician: Merton Border, MD  HPI: Marie Chavez is a 65 y.o. female seen for follow up of Acute on Chronic Respiratory Failure.  She remains on T collar at baseline right now has been on 28% FiO2  Medications: Reviewed on Rounds  Physical Exam:  Vitals: Temperature is 98.0 pulse 90 respiratory 27 blood pressure is 157/76 saturations 100%  Ventilator Settings currently on T collar on FiO2 of 28%   General: Comfortable at this time  Eyes: Grossly normal lids, irises & conjunctiva  ENT: grossly tongue is normal  Neck: no obvious mass  Cardiovascular: S1 S2 normal no gallop  Respiratory: No rhonchi very coarse breath sound  Abdomen: soft  Skin: no rash seen on limited exam  Musculoskeletal: not rigid  Psychiatric:unable to assess  Neurologic: no seizure no involuntary movements         Lab Data:   Basic Metabolic Panel: Recent Labs  Lab 11/02/20 0334 11/03/20 0938 11/04/20 0648 11/05/20 0419 11/06/20 0415  NA 143  --   --   --   --   K 3.2* 2.8* 3.4* 3.3* 3.1*  CL 98  --   --   --   --   CO2 32  --   --   --   --   GLUCOSE 222*  --   --   --   --   BUN 49*  --   --   --   --   CREATININE 0.47  --   --   --   --   CALCIUM 9.1  --   --   --   --     ABG: No results for input(s): PHART, PCO2ART, PO2ART, HCO3, O2SAT in the last 168 hours.  Liver Function Tests: No results for input(s): AST, ALT, ALKPHOS, BILITOT, PROT, ALBUMIN in the last 168 hours. No results for input(s): LIPASE, AMYLASE in the last 168 hours. No results for input(s): AMMONIA in the last 168 hours.  CBC: No results for input(s): WBC, NEUTROABS, HGB, HCT, MCV, PLT in the last 168 hours.  Cardiac Enzymes: No results for input(s): CKTOTAL, CKMB, CKMBINDEX,  TROPONINI in the last 168 hours.  BNP (last 3 results) Recent Labs    09/09/20 0623  BNP 137.4*    ProBNP (last 3 results) No results for input(s): PROBNP in the last 8760 hours.  Radiological Exams: No results found.  Assessment/Plan Active Problems:   Acute on chronic respiratory failure with hypoxia ([place])   COVID-19 virus infection   Pneumonia due to COVID-19 virus   History of pulmonary embolism   Severe sepsis ([place])   1. Acute on chronic respiratory failure hypoxia plan is to continue with the T collar trials.  We will continue secretion management supportive care. 2. COVID-19 virus infection recovery we will continue to follow 3. Pneumonia due to COVID-19 slow improvement 4. History of pulmonary embolism treated 5. Severe sepsis this has resolved   I have personally seen and evaluated the patient, evaluated laboratory and imaging results, formulated the assessment and plan and placed orders. The Patient requires high complexity decision making with multiple systems involvement.  Rounds were done with the Respiratory Therapy Director and Staff therapists and discussed with nursing staff also.  [name], MD [facility] Pulmonary Critical  Care Medicine Sleep Medicine

## 2020-11-07 DIAGNOSIS — U071 COVID-19: Secondary | ICD-10-CM | POA: Diagnosis not present

## 2020-11-07 DIAGNOSIS — A419 Sepsis, unspecified organism: Secondary | ICD-10-CM | POA: Diagnosis not present

## 2020-11-07 DIAGNOSIS — J9621 Acute and chronic respiratory failure with hypoxia: Secondary | ICD-10-CM | POA: Diagnosis not present

## 2020-11-07 DIAGNOSIS — Z86711 Personal history of pulmonary embolism: Secondary | ICD-10-CM | POA: Diagnosis not present

## 2020-11-07 LAB — BASIC METABOLIC PANEL
Anion gap: 14 (ref 5–15)
BUN: 49 mg/dL — ABNORMAL HIGH (ref 8–23)
CO2: 29 mmol/L (ref 22–32)
Calcium: 9.7 mg/dL (ref 8.9–10.3)
Chloride: 102 mmol/L (ref 98–111)
Creatinine, Ser: 0.5 mg/dL (ref 0.44–1.00)
GFR, Estimated: >60 mL/min (ref >60)
Glucose, Bld: 133 mg/dL — ABNORMAL HIGH (ref 70–99)
Potassium: 3.7 mmol/L (ref 3.5–5.1)
Sodium: 145 mmol/L (ref 135–145)

## 2020-11-07 LAB — BLOOD GAS, ARTERIAL
Acid-Base Excess: 6.3 mmol/L — ABNORMAL HIGH (ref 0.0–2.0)
Bicarbonate: 30.3 mmol/L — ABNORMAL HIGH (ref 20.0–28.0)
FIO2: 28
O2 Saturation: 99.6 %
Patient temperature: 36.2
pCO2 arterial: 42.6 mmHg (ref 32.0–48.0)
pH, Arterial: 7.462 — ABNORMAL HIGH (ref 7.350–7.450)
pO2, Arterial: 151 mmHg — ABNORMAL HIGH (ref 83.0–108.0)

## 2020-11-07 LAB — URINALYSIS, MICROSCOPIC (REFLEX)

## 2020-11-07 LAB — URINALYSIS, ROUTINE W REFLEX MICROSCOPIC
Bilirubin Urine: NEGATIVE
Glucose, UA: NEGATIVE mg/dL
Ketones, ur: NEGATIVE mg/dL
Nitrite: NEGATIVE
Protein, ur: NEGATIVE mg/dL
Specific Gravity, Urine: <1.005 — ABNORMAL LOW (ref 1.005–1.030)
pH: >9 — ABNORMAL HIGH (ref 5.0–8.0)

## 2020-11-07 LAB — NOVEL CORONAVIRUS, NAA (HOSP ORDER, SEND-OUT TO REF LAB; TAT 18-24 HRS): SARS-CoV-2, NAA: NOT DETECTED

## 2020-11-07 NOTE — Progress Notes (Signed)
Pulmonary Critical Care Medicine [place]   PULMONARY CRITICAL CARE SERVICE  PROGRESS NOTE  Date of Service: 11/07/2020  ATHEA HALEY  QBH:[id number]  DOB: [birth year]   DOA: 09/02/2020  Referring Physician: Merton Border, MD  HPI: Marie Chavez is a 65 y.o. female seen for follow up of Acute on Chronic Respiratory Failure.  Patient right now is on T collar appears to be comfortable right now without distress at this time  Medications: Reviewed on Rounds  Physical Exam:  Vitals: Temperature is 97.2 pulse 92 respiratory 28 blood pressure is 128/64 saturations 100%  Ventilator Settings on T collar  . General: Comfortable at this time . Eyes: Grossly normal lids, irises & conjunctiva . ENT: grossly tongue is normal . Neck: no obvious mass . Cardiovascular: S1 S2 normal no gallop . Respiratory: No rhonchi no rales are noted at this time . Abdomen: soft . Skin: no rash seen on limited exam . Musculoskeletal: not rigid . Psychiatric:unable to assess . Neurologic: no seizure no involuntary movements         Lab Data:   Basic Metabolic Panel: Recent Labs  Lab 11/02/20 0334 11/03/20 0938 11/04/20 0648 11/05/20 0419 11/06/20 0415 11/07/20 0629  NA 143  --   --   --   --  145  K 3.2* 2.8* 3.4* 3.3* 3.1* 3.7  CL 98  --   --   --   --  102  CO2 32  --   --   --   --  29  GLUCOSE 222*  --   --   --   --  133*  BUN 49*  --   --   --   --  49*  CREATININE 0.47  --   --   --   --  0.50  CALCIUM 9.1  --   --   --   --  9.7    ABG: No results for input(s): PHART, PCO2ART, PO2ART, HCO3, O2SAT in the last 168 hours.  Liver Function Tests: No results for input(s): AST, ALT, ALKPHOS, BILITOT, PROT, ALBUMIN in the last 168 hours. No results for input(s): LIPASE, AMYLASE in the last 168 hours. No results for input(s): AMMONIA in the last 168 hours.  CBC: No results for input(s): WBC, NEUTROABS, HGB, HCT, MCV, PLT in the last 168 hours.  Cardiac  Enzymes: No results for input(s): CKTOTAL, CKMB, CKMBINDEX, TROPONINI in the last 168 hours.  BNP (last 3 results) Recent Labs    09/09/20 0623  BNP 137.4*    ProBNP (last 3 results) No results for input(s): PROBNP in the last 8760 hours.  Radiological Exams: No results found.  Assessment/Plan Active Problems:   Acute on chronic respiratory failure with hypoxia ([place])   COVID-19 virus infection   Pneumonia due to COVID-19 virus   History of pulmonary embolism   Severe sepsis ([place])   1. Acute on chronic respiratory failure hypoxia we will continue with the T-piece she is at baseline awaiting discharge 2. COVID-19 virus infection recovery 3. Pneumonia due to 19 we will continue with supportive care 4. History of pulmonary embolism treated 5. Severe sepsis resolved   I have personally seen and evaluated the patient, evaluated laboratory and imaging results, formulated the assessment and plan and placed orders. The Patient requires high complexity decision making with multiple systems involvement.  Rounds were done with the Respiratory Therapy Director and Staff therapists and discussed with nursing staff also.  [name],  MD [facility] Pulmonary Critical Care Medicine Sleep Medicine

## 2020-11-08 DIAGNOSIS — U071 COVID-19: Secondary | ICD-10-CM | POA: Diagnosis not present

## 2020-11-08 DIAGNOSIS — Z86711 Personal history of pulmonary embolism: Secondary | ICD-10-CM | POA: Diagnosis not present

## 2020-11-08 DIAGNOSIS — J9621 Acute and chronic respiratory failure with hypoxia: Secondary | ICD-10-CM | POA: Diagnosis not present

## 2020-11-08 DIAGNOSIS — A419 Sepsis, unspecified organism: Secondary | ICD-10-CM | POA: Diagnosis not present

## 2020-11-08 NOTE — Progress Notes (Signed)
Pulmonary Critical Care Medicine [place]   PULMONARY CRITICAL CARE SERVICE  PROGRESS NOTE  Date of Service: 11/08/2020  Marie Chavez  QMV:[id number]  DOB: 1955-12-14   DOA: 09/02/2020  Referring Physician: Merton Border, MD  HPI: Marie Chavez is a 65 y.o. female seen for follow up of Acute on Chronic Respiratory Failure.  Patient currently is on T collar she is at her baseline  Medications: Reviewed on Rounds  Physical Exam:  Vitals: Temperature is 97.8 pulse 77 respiratory rate 26 blood pressure is 114/76 saturations 100  Ventilator Settings off the vent on T collar FiO2 28%  . General: Comfortable at this time . Eyes: Grossly normal lids, irises & conjunctiva . ENT: grossly tongue is normal . Neck: no obvious mass . Cardiovascular: S1 S2 normal no gallop . Respiratory: Scattered rhonchi expansion is equal . Abdomen: soft . Skin: no rash seen on limited exam . Musculoskeletal: not rigid . Psychiatric:unable to assess . Neurologic: no seizure no involuntary movements         Lab Data:   Basic Metabolic Panel: Recent Labs  Lab 11/02/20 0334 11/03/20 0938 11/04/20 0648 11/05/20 0419 11/06/20 0415 11/07/20 0629  NA 143  --   --   --   --  145  K 3.2* 2.8* 3.4* 3.3* 3.1* 3.7  CL 98  --   --   --   --  102  CO2 32  --   --   --   --  29  GLUCOSE 222*  --   --   --   --  133*  BUN 49*  --   --   --   --  49*  CREATININE 0.47  --   --   --   --  0.50  CALCIUM 9.1  --   --   --   --  9.7    ABG: Recent Labs  Lab 11/07/20 1518  PHART 7.462*  PCO2ART 42.6  PO2ART 151*  HCO3 30.3*  O2SAT 99.6    Liver Function Tests: No results for input(s): AST, ALT, ALKPHOS, BILITOT, PROT, ALBUMIN in the last 168 hours. No results for input(s): LIPASE, AMYLASE in the last 168 hours. No results for input(s): AMMONIA in the last 168 hours.  CBC: No results for input(s): WBC, NEUTROABS, HGB, HCT, MCV, PLT in the last 168 hours.  Cardiac  Enzymes: No results for input(s): CKTOTAL, CKMB, CKMBINDEX, TROPONINI in the last 168 hours.  BNP (last 3 results) Recent Labs    09/09/20 0623  BNP 137.4*    ProBNP (last 3 results) No results for input(s): PROBNP in the last 8760 hours.  Radiological Exams: No results found.  Assessment/Plan Active Problems:   Acute on chronic respiratory failure with hypoxia ([place])   COVID-19 virus infection   Pneumonia due to COVID-19 virus   History of pulmonary embolism   Severe sepsis ([place])   1. Acute on chronic respiratory failure hypoxia we will continue with T-piece patient is at baseline 28% FiO2 2. COVID-19 virus infection treated in resolution 3. Pneumonia due to COVID-19 at baseline 4. History of pulmonary embolism supportive care 5. Severe sepsis resolved   I have personally seen and evaluated the patient, evaluated laboratory and imaging results, formulated the assessment and plan and placed orders. The Patient requires high complexity decision making with multiple systems involvement.  Rounds were done with the Respiratory Therapy Director and Staff therapists and discussed with nursing staff also.  Marie Chavez  Richardson Dopp, MD [facility] Pulmonary Critical Care Medicine Sleep Medicine

## 2020-11-09 LAB — CBC
HCT: 32.7 % — ABNORMAL LOW (ref 36.0–46.0)
Hemoglobin: 9.4 g/dL — ABNORMAL LOW (ref 12.0–15.0)
MCH: 29.5 pg (ref 26.0–34.0)
MCHC: 28.7 g/dL — ABNORMAL LOW (ref 30.0–36.0)
MCV: 102.5 fL — ABNORMAL HIGH (ref 80.0–100.0)
Platelets: 176 10*3/uL (ref 150–400)
RBC: 3.19 MIL/uL — ABNORMAL LOW (ref 3.87–5.11)
RDW: 17.5 % — ABNORMAL HIGH (ref 11.5–15.5)
WBC: 9 10*3/uL (ref 4.0–10.5)
nRBC: 1.9 % — ABNORMAL HIGH (ref 0.0–0.2)

## 2020-11-09 LAB — BASIC METABOLIC PANEL
Anion gap: 14 (ref 5–15)
BUN: 65 mg/dL — ABNORMAL HIGH (ref 8–23)
CO2: 31 mmol/L (ref 22–32)
Calcium: 9.9 mg/dL (ref 8.9–10.3)
Chloride: 104 mmol/L (ref 98–111)
Creatinine, Ser: 0.51 mg/dL (ref 0.44–1.00)
GFR, Estimated: >60 mL/min (ref >60)
Glucose, Bld: 135 mg/dL — ABNORMAL HIGH (ref 70–99)
Potassium: 4.1 mmol/L (ref 3.5–5.1)
Sodium: 149 mmol/L — ABNORMAL HIGH (ref 135–145)

## 2020-11-10 DIAGNOSIS — A419 Sepsis, unspecified organism: Secondary | ICD-10-CM | POA: Diagnosis not present

## 2020-11-10 DIAGNOSIS — Z86711 Personal history of pulmonary embolism: Secondary | ICD-10-CM | POA: Diagnosis not present

## 2020-11-10 DIAGNOSIS — J9621 Acute and chronic respiratory failure with hypoxia: Secondary | ICD-10-CM | POA: Diagnosis not present

## 2020-11-10 DIAGNOSIS — U071 COVID-19: Secondary | ICD-10-CM | POA: Diagnosis not present

## 2020-11-10 LAB — URINE CULTURE: Culture: >=100000 — AB

## 2020-11-10 NOTE — Progress Notes (Signed)
Pulmonary Critical Care Medicine [place]   PULMONARY CRITICAL CARE SERVICE  PROGRESS NOTE  Date of Service: 11/10/2020  Marie Chavez  NAT:[id number]  DOB: 1955-10-27   DOA: 09/02/2020  Referring Physician: Merton Border, MD  HPI: Marie Chavez is a 65 y.o. female seen for follow up of Acute on Chronic Respiratory Failure.  She is at baseline right now on T collar has been on 20% FiO2 good saturations are noted  Medications: Reviewed on Rounds  Physical Exam:  Vitals: Temperature 97.4 pulse 84 respiratory rate is 15 blood pressure is 126/79 saturations 100%  Ventilator Settings on T collar with an FiO2 28 percent  . General: Comfortable at this time . Eyes: Grossly normal lids, irises & conjunctiva . ENT: grossly tongue is normal . Neck: no obvious mass . Cardiovascular: S1 S2 normal no gallop . Respiratory: Scattered rhonchi expansion is equal . Abdomen: soft . Skin: no rash seen on limited exam . Musculoskeletal: not rigid . Psychiatric:unable to assess . Neurologic: no seizure no involuntary movements         Lab Data:   Basic Metabolic Panel: Recent Labs  Lab 11/04/20 0648 11/05/20 0419 11/06/20 0415 11/07/20 0629 11/09/20 0440  NA  --   --   --  145 149*  K 3.4* 3.3* 3.1* 3.7 4.1  CL  --   --   --  102 104  CO2  --   --   --  29 31  GLUCOSE  --   --   --  133* 135*  BUN  --   --   --  49* 65*  CREATININE  --   --   --  0.50 0.51  CALCIUM  --   --   --  9.7 9.9    ABG: Recent Labs  Lab 11/07/20 1518  PHART 7.462*  PCO2ART 42.6  PO2ART 151*  HCO3 30.3*  O2SAT 99.6    Liver Function Tests: No results for input(s): AST, ALT, ALKPHOS, BILITOT, PROT, ALBUMIN in the last 168 hours. No results for input(s): LIPASE, AMYLASE in the last 168 hours. No results for input(s): AMMONIA in the last 168 hours.  CBC: Recent Labs  Lab 11/09/20 0440  WBC 9.0  HGB 9.4*  HCT 32.7*  MCV 102.5*  PLT 176    Cardiac Enzymes: No  results for input(s): CKTOTAL, CKMB, CKMBINDEX, TROPONINI in the last 168 hours.  BNP (last 3 results) Recent Labs    09/09/20 0623  BNP 137.4*    ProBNP (last 3 results) No results for input(s): PROBNP in the last 8760 hours.  Radiological Exams: No results found.  Assessment/Plan Active Problems:   Acute on chronic respiratory failure with hypoxia ([place])   COVID-19 virus infection   Pneumonia due to COVID-19 virus   History of pulmonary embolism   Severe sepsis ([place])   1. Acute on chronic respiratory failure hypoxia we will continue with the T collar patient is at her baseline 2. COVID-19 virus infection recovery 3. Pneumonia due to COVID-19 treated slow improvement 4. History of pulmonary embolism no change 5. Severe sepsis treated   I have personally seen and evaluated the patient, evaluated laboratory and imaging results, formulated the assessment and plan and placed orders. The Patient requires high complexity decision making with multiple systems involvement.  Rounds were done with the Respiratory Therapy Director and Staff therapists and discussed with nursing staff also.  [name], MD [facility] Pulmonary Critical Care Medicine Sleep  Medicine

## 2020-11-11 DIAGNOSIS — U071 COVID-19: Secondary | ICD-10-CM | POA: Diagnosis not present

## 2020-11-11 DIAGNOSIS — Z86711 Personal history of pulmonary embolism: Secondary | ICD-10-CM | POA: Diagnosis not present

## 2020-11-11 DIAGNOSIS — A419 Sepsis, unspecified organism: Secondary | ICD-10-CM | POA: Diagnosis not present

## 2020-11-11 DIAGNOSIS — J9621 Acute and chronic respiratory failure with hypoxia: Secondary | ICD-10-CM | POA: Diagnosis not present

## 2020-11-11 LAB — BASIC METABOLIC PANEL
Anion gap: 13 (ref 5–15)
BUN: 57 mg/dL — ABNORMAL HIGH (ref 8–23)
CO2: 32 mmol/L (ref 22–32)
Calcium: 9.7 mg/dL (ref 8.9–10.3)
Chloride: 101 mmol/L (ref 98–111)
Creatinine, Ser: 0.54 mg/dL (ref 0.44–1.00)
GFR, Estimated: >60 mL/min (ref >60)
Glucose, Bld: 145 mg/dL — ABNORMAL HIGH (ref 70–99)
Potassium: 3.7 mmol/L (ref 3.5–5.1)
Sodium: 146 mmol/L — ABNORMAL HIGH (ref 135–145)

## 2020-11-11 NOTE — Progress Notes (Signed)
Pulmonary Critical Care Medicine [place]   PULMONARY CRITICAL CARE SERVICE  PROGRESS NOTE  Date of Service: 11/11/2020  Marie Chavez  [id number]  DOB: [birth year]   DOA: 09/02/2020  Referring Physician: Merton Border, MD  HPI: Marie Chavez is a 65 y.o. female seen for follow up of Acute on Chronic Respiratory Failure.  Patient is on T collar currently on 28% FiO2  Medications: Reviewed on Rounds  Physical Exam:  Vitals: Temperature is 97.1 pulse 83 respiratory 15 blood pressure is 113/75 saturations 99%  Ventilator Settings on T collar with an FiO2 of 28%  . General: Comfortable at this time . Eyes: Grossly normal lids, irises & conjunctiva . ENT: grossly tongue is normal . Neck: no obvious mass . Cardiovascular: S1 S2 normal no gallop . Respiratory: Scattered rhonchi expansion is equal . Abdomen: soft . Skin: no rash seen on limited exam . Musculoskeletal: not rigid . Psychiatric:unable to assess . Neurologic: no seizure no involuntary movements         Lab Data:   Basic Metabolic Panel: Recent Labs  Lab 11/05/20 0419 11/06/20 0415 11/07/20 0629 11/09/20 0440 11/11/20 0445  NA  --   --  145 149* 146*  K 3.3* 3.1* 3.7 4.1 3.7  CL  --   --  102 104 101  CO2  --   --  29 31 32  GLUCOSE  --   --  133* 135* 145*  BUN  --   --  49* 65* 57*  CREATININE  --   --  0.50 0.51 0.54  CALCIUM  --   --  9.7 9.9 9.7    ABG: Recent Labs  Lab 11/07/20 1518  PHART 7.462*  PCO2ART 42.6  PO2ART 151*  HCO3 30.3*  O2SAT 99.6    Liver Function Tests: No results for input(s): AST, ALT, ALKPHOS, BILITOT, PROT, ALBUMIN in the last 168 hours. No results for input(s): LIPASE, AMYLASE in the last 168 hours. No results for input(s): AMMONIA in the last 168 hours.  CBC: Recent Labs  Lab 11/09/20 0440  WBC 9.0  HGB 9.4*  HCT 32.7*  MCV 102.5*  PLT 176    Cardiac Enzymes: No results for input(s): CKTOTAL, CKMB, CKMBINDEX, TROPONINI in  the last 168 hours.  BNP (last 3 results) Recent Labs    09/09/20 0623  BNP 137.4*    ProBNP (last 3 results) No results for input(s): PROBNP in the last 8760 hours.  Radiological Exams: No results found.  Assessment/Plan Active Problems:   Acute on chronic respiratory failure with hypoxia ([place])   COVID-19 virus infection   Pneumonia due to COVID-19 virus   History of pulmonary embolism   Severe sepsis ([place])   1. Acute on chronic respiratory failure with hypoxia patient currently is on T collar has been on 28% FiO2 good saturations are noted. 2. COVID-19 virus infection recovery 3. Pneumonia due to COVID-19 treated slow improvement 4. History of pulmonary embolism has been on anticoagulation 5. Severe sepsis treated resolved   I have personally seen and evaluated the patient, evaluated laboratory and imaging results, formulated the assessment and plan and placed orders. The Patient requires high complexity decision making with multiple systems involvement.  Rounds were done with the Respiratory Therapy Director and Staff therapists and discussed with nursing staff also.  [name], MD [facility] Pulmonary Critical Care Medicine Sleep Medicine

## 2020-11-12 DIAGNOSIS — A419 Sepsis, unspecified organism: Secondary | ICD-10-CM | POA: Diagnosis not present

## 2020-11-12 DIAGNOSIS — U071 COVID-19: Secondary | ICD-10-CM | POA: Diagnosis not present

## 2020-11-12 DIAGNOSIS — J9621 Acute and chronic respiratory failure with hypoxia: Secondary | ICD-10-CM | POA: Diagnosis not present

## 2020-11-12 DIAGNOSIS — Z86711 Personal history of pulmonary embolism: Secondary | ICD-10-CM | POA: Diagnosis not present

## 2020-11-12 NOTE — Progress Notes (Signed)
Pulmonary Critical Care Medicine [place]   PULMONARY CRITICAL CARE SERVICE  PROGRESS NOTE  Date of Service: 11/12/2020  Marie Chavez  BWI:[id number]  DOB: [birth year]   DOA: 09/02/2020  Referring Physician: Merton Border, MD  HPI: Marie Chavez is a 66 y.o. female seen for follow up of Acute on Chronic Respiratory Failure.  Patient is on T collar currently 28% FiO2 remains unchanged  Medications: Reviewed on Rounds  Physical Exam:  Vitals: Temperature 97.3 pulse 100 respiratory rate 20 blood pressure is 129/81 saturations 98%  Ventilator Settings off the ventilator on T collar  . General: Comfortable at this time . Eyes: Grossly normal lids, irises & conjunctiva . ENT: grossly tongue is normal . Neck: no obvious mass . Cardiovascular: S1 S2 normal no gallop . Respiratory: No rhonchi very coarse breath sounds . Abdomen: soft . Skin: no rash seen on limited exam . Musculoskeletal: not rigid . Psychiatric:unable to assess . Neurologic: no seizure no involuntary movements         Lab Data:   Basic Metabolic Panel: Recent Labs  Lab 11/06/20 0415 11/07/20 0629 11/09/20 0440 11/11/20 0445  NA  --  145 149* 146*  K 3.1* 3.7 4.1 3.7  CL  --  102 104 101  CO2  --  29 31 32  GLUCOSE  --  133* 135* 145*  BUN  --  49* 65* 57*  CREATININE  --  0.50 0.51 0.54  CALCIUM  --  9.7 9.9 9.7    ABG: Recent Labs  Lab 11/07/20 1518  PHART 7.462*  PCO2ART 42.6  PO2ART 151*  HCO3 30.3*  O2SAT 99.6    Liver Function Tests: No results for input(s): AST, ALT, ALKPHOS, BILITOT, PROT, ALBUMIN in the last 168 hours. No results for input(s): LIPASE, AMYLASE in the last 168 hours. No results for input(s): AMMONIA in the last 168 hours.  CBC: Recent Labs  Lab 11/09/20 0440  WBC 9.0  HGB 9.4*  HCT 32.7*  MCV 102.5*  PLT 176    Cardiac Enzymes: No results for input(s): CKTOTAL, CKMB, CKMBINDEX, TROPONINI in the last 168 hours.  BNP (last 3  results) Recent Labs    09/09/20 0623  BNP 137.4*    ProBNP (last 3 results) No results for input(s): PROBNP in the last 8760 hours.  Radiological Exams: No results found.  Assessment/Plan Active Problems:   Acute on chronic respiratory failure with hypoxia ([place])   COVID-19 virus infection   Pneumonia due to COVID-19 virus   History of pulmonary embolism   Severe sepsis ([place])   1. Acute on chronic respiratory failure with hypoxia we will continue with T collar trials 2. COVID-19 virus infection in recovery 3. Pneumonia due to COVID-19 treated 4. History of pulmonary embolism patient is at baseline 5. Severe sepsis treated resolved   I have personally seen and evaluated the patient, evaluated laboratory and imaging results, formulated the assessment and plan and placed orders. The Patient requires high complexity decision making with multiple systems involvement.  Rounds were done with the Respiratory Therapy Director and Staff therapists and discussed with nursing staff also.  [name], MD [facility] Pulmonary Critical Care Medicine Sleep Medicine

## 2020-11-13 DIAGNOSIS — A419 Sepsis, unspecified organism: Secondary | ICD-10-CM | POA: Diagnosis not present

## 2020-11-13 DIAGNOSIS — J9621 Acute and chronic respiratory failure with hypoxia: Secondary | ICD-10-CM | POA: Diagnosis not present

## 2020-11-13 DIAGNOSIS — U071 COVID-19: Secondary | ICD-10-CM | POA: Diagnosis not present

## 2020-11-13 DIAGNOSIS — Z86711 Personal history of pulmonary embolism: Secondary | ICD-10-CM | POA: Diagnosis not present

## 2020-11-13 LAB — NOVEL CORONAVIRUS, NAA (HOSP ORDER, SEND-OUT TO REF LAB; TAT 18-24 HRS): SARS-CoV-2, NAA: DETECTED — AB

## 2020-11-13 NOTE — Progress Notes (Signed)
Pulmonary Critical Care Medicine [place]   PULMONARY CRITICAL CARE SERVICE  PROGRESS NOTE  Date of Service: 11/13/2020  Marie Chavez  FOY:[id number]  DOB: 1956/10/14   DOA: 09/02/2020  Referring Physician: Merton Border, MD  HPI: Marie Chavez is a 65 y.o. female seen for follow up of Acute on Chronic Respiratory Failure.  Patient currently is on T collar has been on 28% FiO2 using PMV  Medications: Reviewed on Rounds  Physical Exam:  Vitals: Temperature is 97.8 pulse 60 respiratory 18 blood pressure is 116/83 saturations 97%  Ventilator Settings on T collar with an FiO2 of 28%  . General: Comfortable at this time . Eyes: Grossly normal lids, irises & conjunctiva . ENT: grossly tongue is normal . Neck: no obvious mass . Cardiovascular: S1 S2 normal no gallop . Respiratory: No rhonchi very coarse percent . Abdomen: soft . Skin: no rash seen on limited exam . Musculoskeletal: not rigid . Psychiatric:unable to assess . Neurologic: no seizure no involuntary movements         Lab Data:   Basic Metabolic Panel: Recent Labs  Lab 11/07/20 0629 11/09/20 0440 11/11/20 0445  NA 145 149* 146*  K 3.7 4.1 3.7  CL 102 104 101  CO2 29 31 32  GLUCOSE 133* 135* 145*  BUN 49* 65* 57*  CREATININE 0.50 0.51 0.54  CALCIUM 9.7 9.9 9.7    ABG: Recent Labs  Lab 11/07/20 1518  PHART 7.462*  PCO2ART 42.6  PO2ART 151*  HCO3 30.3*  O2SAT 99.6    Liver Function Tests: No results for input(s): AST, ALT, ALKPHOS, BILITOT, PROT, ALBUMIN in the last 168 hours. No results for input(s): LIPASE, AMYLASE in the last 168 hours. No results for input(s): AMMONIA in the last 168 hours.  CBC: Recent Labs  Lab 11/09/20 0440  WBC 9.0  HGB 9.4*  HCT 32.7*  MCV 102.5*  PLT 176    Cardiac Enzymes: No results for input(s): CKTOTAL, CKMB, CKMBINDEX, TROPONINI in the last 168 hours.  BNP (last 3 results) Recent Labs    09/09/20 0623  BNP 137.4*     ProBNP (last 3 results) No results for input(s): PROBNP in the last 8760 hours.  Radiological Exams: No results found.  Assessment/Plan Active Problems:   Acute on chronic respiratory failure with hypoxia ([place])   COVID-19 virus infection   Pneumonia due to COVID-19 virus   History of pulmonary embolism   Severe sepsis ([place])   1. Acute on chronic respiratory failure hypoxia we will continue with T-piece using PMV secretions are still copious 2. COVID-19 virus infection recovery phase 3. Pneumonia due to COVID-19 treated we will continue to monitor 4. History of pulmonary embolism at baseline 5. Severe sepsis treated with antibiotics resolved   I have personally seen and evaluated the patient, evaluated laboratory and imaging results, formulated the assessment and plan and placed orders. The Patient requires high complexity decision making with multiple systems involvement.  Rounds were done with the Respiratory Therapy Director and Staff therapists and discussed with nursing staff also.  [name], MD [facility] Pulmonary Critical Care Medicine Sleep Medicine

## 2020-11-13 NOTE — Progress Notes (Signed)
PROGRESS NOTE    Marie Chavez  UMP:[id number] DOB: 1955-11-29 DOA: 09/02/2020   Brief Narrative:  Marie Chavez is an 65 y.o. female with medical history of asthma who initially presented to [facility] on 07/28/2020 with 4-day history of worsening shortness of breath.  Patient apparently tested positive for COVID-19 infection on 07/25/2020.  She continued to have cough with worsening shortness of breath nausea, diaphoresis.  She apparently is on 3 L oxygen at baseline per records from the outside facility.  On EMS arrival patient had 72% oxygen saturation and was placed on nonrebreather with 60 L with some improvement.  Regarding her COVID-19 infection she received monoclonal antibodies - Casirivimab & Imdevimab.  On 07/30/2019 when she was placed on BiPAP.  However, continued to have worsening respiratory failure therefore on 07/31/2019 when she was intubated.  On 08/08/2020 patient had head CT which showed concerns for acute infarct.  Neurology was consulted.  MRI was negative.  On 08/16/2019 when she had tracheostomy. On 08/26/2019 patient was noted to have fever and leukocytosis therefore started on antibiotics.  There was concern for septic arthritis in the right knee.  Ortho was consulted.  She had fluid aspiration which was negative.  Due to her complex medical problems she was transferred to [facility] and admitted here on 09/02/2020. Respiratory cultures from 09/06/2020 showed Pseudomonas aeruginosa for which she was treated with ceftazidime.  She has repeat respiratory cultures from 09/26/2020 which again showed moderate Pseudomonas aeruginosa.  She was treated with IV vancomycin, ceftazidime, Flagyl. 10/16/2020 repeat respiratory cultures still showing Pseudomonas.  Chest x-ray-per report confluent bilateral pulmonary opacity.  For this she was treated with ciprofloxacin, Flagyl, tobramycin nebulizers.  She again started having fever.  Her urine culture showed  Klebsiella ESBL for which she has been started on meropenem.  Assessment & Plan:  Active Problems:   Acute on chronic respiratory failure with hypoxia   Pneumonia with Pseudomonas UTI with ESBL Klebsiella   COVID-19 virus infection Systemic inflammatory response syndrome COPD Protein calorie malnutrition/dysphagia Diabetes mellitus History of seizures Hypertension/hypothyroidism Thrombocytopenia    Acute on chronic respiratory failure with hypoxemia: She had respiratory cultures that showed Pseudomonas.  She received treatment with multiple rounds of antibiotics.  More recently with ciprofloxacin, Flagyl and tobramycin nebulizers.  Now started on meropenem for UTI with ESBL Klebsiella.  If her respiratory status worsens consider repeating chest imaging preferably chest CT to better evaluate. She is high risk for ARDS secondary to recent COVID-19 infection. Pulmonary following.  Pneumonia: She initially had COVID-19 infection and subsequently had secondary bacterial pneumonia with respiratory cultures that showed Pseudomonas.  As mentioned above, she received treatment with multiple rounds of antibiotics.  Respiratory at this appears to be stable at this time.  If she worsens consider repeating chest imaging preferably chest CT to better evaluate.    COVID-19 infection: Patient already received treatment for COVID-19 infection.  She is still testing positive. At this time continue supportive management since she was already treated for this.  UTI: Patient had urine cultures that showed ESBL Klebsiella.  Currently on treatment with meropenem.  Tentative end date for the meropenem 11/20/2020 pending improvement.  Systemic inflammatory response syndrome: Likely secondary to UTI.  Previously had pneumonia.  Improving.  On antibiotics as mentioned above.  COPD: Continue medication and management per the primary team.  Protein calorie malnutrition/dysphagia: Management per the primary team.   Due to her dysphagia she is high risk for aspiration  and aspiration pneumonia.  History of seizures: Antibiotics at times tend to lower the seizure threshold.  However, at this time she needs antibiotics because of UTI.  Continue to monitor closely. Continue medications and management per primary team.  Hypertension/hypothyroidism: Continue medication and management per primary team.  Thrombocytopenia: Patient noted to have low platelet count.  Currently improving.  Due to her complex medical problems she is high risk for worsening and decompensation.  Plan of care discussed with the primary team, pharmacy.   Subjective: She denies having any new complaints at this time.  Objective: Vitals: Temperature 97.3, pulse 89, respiratory rate 28, blood pressure 128/74, pulse oximetry 98% Examination: Constitutional: ill appearing, awake Head: Atraumatic, normocephalic Eyes: PERLA, EOMI  ENMT: external ears and nose appear normal, normal hearing, Lips appears normal, moist oral mucosa  Neck: trach in place   CVS: S1-S2  Respiratory:  Rhonchi, no wheezing Abdomen: soft nontender, nondistended, positive bowel sounds Musculoskeletal: Edema Neuro: Debility with generalized weakness Psych: stable mood and affect Skin:  No new rashes    Data Reviewed: I have personally reviewed following labs and imaging studies  CBC: Recent Labs  Lab 11/09/20 0440  WBC 9.0  HGB 9.4*  HCT 32.7*  MCV 102.5*  PLT 295    Basic Metabolic Panel: Recent Labs  Lab 11/07/20 0629 11/09/20 0440 11/11/20 0445  NA 145 149* 146*  K 3.7 4.1 3.7  CL 102 104 101  CO2 29 31 32  GLUCOSE 133* 135* 145*  BUN 49* 65* 57*  CREATININE 0.50 0.51 0.54  CALCIUM 9.7 9.9 9.7    GFR: CrCl cannot be calculated (Unknown ideal weight.).  Liver Function Tests: No results for input(s): AST, ALT, ALKPHOS, BILITOT, PROT, ALBUMIN in the last 168 hours.  CBG: No results for input(s): GLUCAP in the last 168  hours.   Recent Results (from the past 240 hour(s))  Novel Coronavirus, NAA (Hosp order, Send-out to Ref Lab; TAT 18-24 hrs     Status: None   Collection Time: 11/05/20  9:15 AM   Specimen: Nasopharyngeal Swab; Respiratory  Result Value Ref Range Status   SARS-CoV-2, NAA NOT DETECTED NOT DETECTED Final    Comment: (NOTE) Testing was performed using the cobas(R) SARS-CoV-2 test. This nucleic acid amplification test was developed and its performance characteristics determined by [employer]. Nucleic acid amplification tests include RT-PCR and TMA. This test has not been FDA cleared or approved. This test has been authorized by FDA under an Emergency Use Authorization (EUA). This test is only authorized for the duration of time the declaration that circumstances exist justifying the authorization of the emergency use of in vitro diagnostic tests for detection of SARS-CoV-2 virus and/or diagnosis of COVID-19 infection under section 564(b)(1) of the Act, 21 U.S.C. 284XLK-4(M) (1), unless the authorization is terminated or revoked sooner. When diagnostic testing is negative, the possibility of a false negative result should be considered in the context of a patient's recent exposures and the presence of clinical signs and symptoms consistent with COVID-19. An individual withou t symptoms of COVID- 19 and who is not shedding SARS-CoV-2 virus would expect to have a negative (not detected) result in this assay. Performed At: [facility] [place], [place] [id number] Rush Farmer MD UY:[id number]    Coronavirus Source NASOPHARYNGEAL  Final    Comment: Performed at [facility], [place] [address] [place], [place] [address]  Culture, Urine     Status: Abnormal   Collection Time: 11/07/20  2:59 PM   Specimen: Urine, Random  Result Value Ref Range Status   Specimen Description URINE, RANDOM  Final   Special Requests   Final    NONE Performed at [facility], [address], [place], [place] [address]    Culture (A)  Final    >=100,000 COLONIES/mL KLEBSIELLA PNEUMONIAE Confirmed Extended Spectrum Beta-Lactamase Producer (ESBL).  In bloodstream infections from ESBL organisms, carbapenems are preferred over piperacillin/tazobactam. They are shown to have a lower risk of mortality.    Report Status 11/10/2020 FINAL  Final   Organism ID, Bacteria KLEBSIELLA PNEUMONIAE (A)  Final      Susceptibility   Klebsiella pneumoniae - MIC*    AMPICILLIN >=32 RESISTANT Resistant     CEFAZOLIN >=64 RESISTANT Resistant     CEFEPIME 1 SENSITIVE Sensitive     CEFTRIAXONE 8 RESISTANT Resistant     CIPROFLOXACIN >=4 RESISTANT Resistant     GENTAMICIN 8 INTERMEDIATE Intermediate     IMIPENEM <=0.25 SENSITIVE Sensitive     NITROFURANTOIN 128 RESISTANT Resistant     TRIMETH/SULFA <=20 SENSITIVE Sensitive     AMPICILLIN/SULBACTAM >=32 RESISTANT Resistant     PIP/TAZO 32 INTERMEDIATE Intermediate     * >=100,000 COLONIES/mL KLEBSIELLA PNEUMONIAE  Novel Coronavirus, NAA (Hosp order, Send-out to Ref Lab; TAT 18-24 hrs     Status: Abnormal   Collection Time: 11/10/20  4:10 PM   Specimen: Nasopharyngeal Swab; Respiratory  Result Value Ref Range Status   SARS-CoV-2, NAA DETECTED (A) NOT DETECTED Final    Comment: RESULT CALLED TO, READ BACK BY AND VERIFIED WITH: Ave Filter RN [id number] 11/13/20 A BROWNING (NOTE)                  Client Requested Flag Patients who have a positive COVID-19 test result may now have treatment options. Treatment options are available for patients with mild to moderate symptoms and for hospitalized patients. Visit our website at [email or web address] for resources and information. This nucleic acid amplification test was developed and its performance characteristics determined by [employer]. Nucleic acid amplification tests include RT-PCR and TMA. This test has not been FDA cleared or approved. This test has  been authorized by FDA under an Emergency Use Authorization (EUA). This test is only authorized for the duration of time the declaration that circumstances exist justifying the authorization of the emergency use of in vitro diagnostic tests for detection of SARS-CoV-2 virus and/or diagnosis of COVID-19 infection under section 564(b)(1)  of the Act, 21 U.S.C. 357SVX-7(L) (1), unless the authorization is terminated or revoked sooner. When diagnostic testing is negative, the possibility of a false negative result should be considered in the context of a patient's recent exposures and the presence of clinical signs and symptoms consistent with COVID-19. An individual without symptoms of COVID- 19 and who is not shedding SARS-CoV-2 virus would expect to have a negative (not detected) result in this assay. Performed At: [facility] [place], [place] [id number] Rush Farmer MD RA:[id number]    Coronavirus Source NASOPHARYNGEAL  Final    Comment: Performed at [facility], [place] [address], [place], [place] [address]         Radiology Studies: No results found.  Scheduled Meds: Please see MAR    Yaakov Guthrie, MD  11/13/2020, 5:55 PM

## 2020-11-14 DIAGNOSIS — U071 COVID-19: Secondary | ICD-10-CM | POA: Diagnosis not present

## 2020-11-14 DIAGNOSIS — Z86711 Personal history of pulmonary embolism: Secondary | ICD-10-CM | POA: Diagnosis not present

## 2020-11-14 DIAGNOSIS — A419 Sepsis, unspecified organism: Secondary | ICD-10-CM | POA: Diagnosis not present

## 2020-11-14 DIAGNOSIS — J9621 Acute and chronic respiratory failure with hypoxia: Secondary | ICD-10-CM | POA: Diagnosis not present

## 2020-11-14 NOTE — Progress Notes (Signed)
Pulmonary Critical Care Medicine [place]   PULMONARY CRITICAL CARE SERVICE  PROGRESS NOTE  Date of Service: 11/14/2020  Marie Chavez  QBH:[id number]  DOB: [birth year]   DOA: 09/02/2020  Referring Physician: Merton Border, MD  HPI: Marie Chavez is a 65 y.o. female seen for follow up of Acute on Chronic Respiratory Failure.  Patient currently is on 28% FiO2 on T-piece has been using the PMV she is at her baseline  Medications: Reviewed on Rounds  Physical Exam:  Vitals: Temperature is 97.1 pulse 75 respiratory rate is 30 blood pressure is 144/77 saturations 95%  Ventilator Settings on T collar with FiO2 of 28%  . General: Comfortable at this time . Eyes: Grossly normal lids, irises & conjunctiva . ENT: grossly tongue is normal . Neck: no obvious mass . Cardiovascular: S1 S2 normal no gallop . Respiratory: No rhonchi coarse breath sounds . Abdomen: soft . Skin: no rash seen on limited exam . Musculoskeletal: not rigid . Psychiatric:unable to assess . Neurologic: no seizure no involuntary movements         Lab Data:   Basic Metabolic Panel: Recent Labs  Lab 11/09/20 0440 11/11/20 0445  NA 149* 146*  K 4.1 3.7  CL 104 101  CO2 31 32  GLUCOSE 135* 145*  BUN 65* 57*  CREATININE 0.51 0.54  CALCIUM 9.9 9.7    ABG: Recent Labs  Lab 11/07/20 1518  PHART 7.462*  PCO2ART 42.6  PO2ART 151*  HCO3 30.3*  O2SAT 99.6    Liver Function Tests: No results for input(s): AST, ALT, ALKPHOS, BILITOT, PROT, ALBUMIN in the last 168 hours. No results for input(s): LIPASE, AMYLASE in the last 168 hours. No results for input(s): AMMONIA in the last 168 hours.  CBC: Recent Labs  Lab 11/09/20 0440  WBC 9.0  HGB 9.4*  HCT 32.7*  MCV 102.5*  PLT 176    Cardiac Enzymes: No results for input(s): CKTOTAL, CKMB, CKMBINDEX, TROPONINI in the last 168 hours.  BNP (last 3 results) Recent Labs    09/09/20 0623  BNP 137.4*    ProBNP (last 3  results) No results for input(s): PROBNP in the last 8760 hours.  Radiological Exams: No results found.  Assessment/Plan Active Problems:   Acute on chronic respiratory failure with hypoxia ([place])   COVID-19 virus infection   Pneumonia due to COVID-19 virus   History of pulmonary embolism   Severe sepsis ([place])   1. Acute on chronic respiratory failure with hypoxia continue with T-piece titrate oxygen continue pulmonary toilet. 2. COVID-19 virus infection at baseline 3. Pneumonia due to COVID-19 treated improving 4. History of pulmonary embolism at baseline 5. Severe sepsis treated resolved   I have personally seen and evaluated the patient, evaluated laboratory and imaging results, formulated the assessment and plan and placed orders. The Patient requires high complexity decision making with multiple systems involvement.  Rounds were done with the Respiratory Therapy Director and Staff therapists and discussed with nursing staff also.  [name], MD [facility] Pulmonary Critical Care Medicine Sleep Medicine

## 2020-11-15 DIAGNOSIS — U071 COVID-19: Secondary | ICD-10-CM | POA: Diagnosis not present

## 2020-11-15 DIAGNOSIS — A419 Sepsis, unspecified organism: Secondary | ICD-10-CM | POA: Diagnosis not present

## 2020-11-15 DIAGNOSIS — J9621 Acute and chronic respiratory failure with hypoxia: Secondary | ICD-10-CM | POA: Diagnosis not present

## 2020-11-15 DIAGNOSIS — Z86711 Personal history of pulmonary embolism: Secondary | ICD-10-CM | POA: Diagnosis not present

## 2020-11-15 NOTE — Progress Notes (Signed)
Pulmonary Critical Care Medicine [place]   PULMONARY CRITICAL CARE SERVICE  PROGRESS NOTE  Date of Service: 11/15/2020  Marie Chavez  [id number]  DOB: 09-25-1956   DOA: 09/02/2020  Referring Physician: Merton Border, MD  HPI: Marie Chavez is a 66 y.o. female seen for follow up of Acute on Chronic Respiratory Failure.  Patient is on T collar currently on 28% FiO2 with good saturations.  Medications: Reviewed on Rounds  Physical Exam:  Vitals: Temperature is 97.1 pulse 80 respiratory 23 blood pressure is 128/77 saturations 99  Ventilator Settings on T collar FiO2 28%  . General: Comfortable at this time . Eyes: Grossly normal lids, irises & conjunctiva . ENT: grossly tongue is normal . Neck: no obvious mass . Cardiovascular: S1 S2 normal no gallop . Respiratory: No rhonchi very coarse percent . Abdomen: soft . Skin: no rash seen on limited exam . Musculoskeletal: not rigid . Psychiatric:unable to assess . Neurologic: no seizure no involuntary movements         Lab Data:   Basic Metabolic Panel: Recent Labs  Lab 11/09/20 0440 11/11/20 0445  NA 149* 146*  K 4.1 3.7  CL 104 101  CO2 31 32  GLUCOSE 135* 145*  BUN 65* 57*  CREATININE 0.51 0.54  CALCIUM 9.9 9.7    ABG: No results for input(s): PHART, PCO2ART, PO2ART, HCO3, O2SAT in the last 168 hours.  Liver Function Tests: No results for input(s): AST, ALT, ALKPHOS, BILITOT, PROT, ALBUMIN in the last 168 hours. No results for input(s): LIPASE, AMYLASE in the last 168 hours. No results for input(s): AMMONIA in the last 168 hours.  CBC: Recent Labs  Lab 11/09/20 0440  WBC 9.0  HGB 9.4*  HCT 32.7*  MCV 102.5*  PLT 176    Cardiac Enzymes: No results for input(s): CKTOTAL, CKMB, CKMBINDEX, TROPONINI in the last 168 hours.  BNP (last 3 results) Recent Labs    09/09/20 0623  BNP 137.4*    ProBNP (last 3 results) No results for input(s): PROBNP in the last 8760  hours.  Radiological Exams: No results found.  Assessment/Plan Active Problems:   Acute on chronic respiratory failure with hypoxia ([place])   COVID-19 virus infection   Pneumonia due to COVID-19 virus   History of pulmonary embolism   Severe sepsis ([place])   1. Acute on chronic respiratory failure with hypoxia at this time patient is doing fine on T-piece she is at her baseline 2. COVID-19 virus infection treated resolved 3. Pneumonia due to COVID-19 resolved 4. History of pulmonary embolism has been treated 5. Severe sepsis treated resolved   I have personally seen and evaluated the patient, evaluated laboratory and imaging results, formulated the assessment and plan and placed orders. The Patient requires high complexity decision making with multiple systems involvement.  Rounds were done with the Respiratory Therapy Director and Staff therapists and discussed with nursing staff also.  [name], MD [facility] Pulmonary Critical Care Medicine Sleep Medicine

## 2020-11-16 DIAGNOSIS — Z86711 Personal history of pulmonary embolism: Secondary | ICD-10-CM | POA: Diagnosis not present

## 2020-11-16 DIAGNOSIS — J9621 Acute and chronic respiratory failure with hypoxia: Secondary | ICD-10-CM | POA: Diagnosis not present

## 2020-11-16 DIAGNOSIS — A419 Sepsis, unspecified organism: Secondary | ICD-10-CM | POA: Diagnosis not present

## 2020-11-16 DIAGNOSIS — U071 COVID-19: Secondary | ICD-10-CM | POA: Diagnosis not present

## 2020-11-16 LAB — BASIC METABOLIC PANEL
Anion gap: 14 (ref 5–15)
BUN: 60 mg/dL — ABNORMAL HIGH (ref 8–23)
CO2: 33 mmol/L — ABNORMAL HIGH (ref 22–32)
Calcium: 9.7 mg/dL (ref 8.9–10.3)
Chloride: 114 mmol/L — ABNORMAL HIGH (ref 98–111)
Creatinine, Ser: 0.64 mg/dL (ref 0.44–1.00)
GFR, Estimated: >60 mL/min (ref >60)
Glucose, Bld: 165 mg/dL — ABNORMAL HIGH (ref 70–99)
Potassium: 4.4 mmol/L (ref 3.5–5.1)
Sodium: 161 mmol/L (ref 135–145)

## 2020-11-16 LAB — CBC
HCT: 41.4 % (ref 36.0–46.0)
Hemoglobin: 11.5 g/dL — ABNORMAL LOW (ref 12.0–15.0)
MCH: 29.9 pg (ref 26.0–34.0)
MCHC: 27.8 g/dL — ABNORMAL LOW (ref 30.0–36.0)
MCV: 107.5 fL — ABNORMAL HIGH (ref 80.0–100.0)
Platelets: 179 10*3/uL (ref 150–400)
RBC: 3.85 MIL/uL — ABNORMAL LOW (ref 3.87–5.11)
RDW: 18.6 % — ABNORMAL HIGH (ref 11.5–15.5)
WBC: 9.4 10*3/uL (ref 4.0–10.5)
nRBC: 2.5 % — ABNORMAL HIGH (ref 0.0–0.2)

## 2020-11-16 LAB — SODIUM: Sodium: 157 mmol/L — ABNORMAL HIGH (ref 135–145)

## 2020-11-16 NOTE — Progress Notes (Signed)
Pulmonary Critical Care Medicine [place]   PULMONARY CRITICAL CARE SERVICE  PROGRESS NOTE  Date of Service: 11/16/2020  Marie Chavez  [id number]:[id number]  DOB: 1956/05/21   DOA: 09/02/2020  Referring Physician: Merton Border, MD  HPI: Marie Chavez is a 65 y.o. female seen for follow up of Acute on Chronic Respiratory Failure.  Patient currently is on T collar on 28% FiO2  Medications: Reviewed on Rounds  Physical Exam:  Vitals: Temperature is 96.9 pulse 77 respiratory rate 16 blood pressure is 123/81 saturations 98%  Ventilator Settings on T collar with PMV  . General: Comfortable at this time . Eyes: Grossly normal lids, irises & conjunctiva . ENT: grossly tongue is normal . Neck: no obvious mass . Cardiovascular: S1 S2 normal no gallop . Respiratory: No rhonchi no rales are noted at this time . Abdomen: soft . Skin: no rash seen on limited exam . Musculoskeletal: not rigid . Psychiatric:unable to assess . Neurologic: no seizure no involuntary movements         Lab Data:   Basic Metabolic Panel: Recent Labs  Lab 11/11/20 0445 11/16/20 0426  NA 146* 161*  K 3.7 4.4  CL 101 114*  CO2 32 33*  GLUCOSE 145* 165*  BUN 57* 60*  CREATININE 0.54 0.64  CALCIUM 9.7 9.7    ABG: No results for input(s): PHART, PCO2ART, PO2ART, HCO3, O2SAT in the last 168 hours.  Liver Function Tests: No results for input(s): AST, ALT, ALKPHOS, BILITOT, PROT, ALBUMIN in the last 168 hours. No results for input(s): LIPASE, AMYLASE in the last 168 hours. No results for input(s): AMMONIA in the last 168 hours.  CBC: Recent Labs  Lab 11/16/20 0426  WBC 9.4  HGB 11.5*  HCT 41.4  MCV 107.5*  PLT 179    Cardiac Enzymes: No results for input(s): CKTOTAL, CKMB, CKMBINDEX, TROPONINI in the last 168 hours.  BNP (last 3 results) Recent Labs    09/09/20 0623  BNP 137.4*    ProBNP (last 3 results) No results for input(s): PROBNP in the last 8760  hours.  Radiological Exams: No results found.  Assessment/Plan Active Problems:   Acute on chronic respiratory failure with hypoxia ([place])   COVID-19 virus infection   Pneumonia due to COVID-19 virus   History of pulmonary embolism   Severe sepsis ([place])   1. Acute on chronic respiratory failure hypoxia continue with T-piece patient is on 28% FiO2 with the PMV 2. COVID-19 virus infection in recovery we will continue to follow 3. Pneumonia due to COVID-19 treated 4. History of pulmonary embolism at baseline 5. Severe sepsis treated in resolution   I have personally seen and evaluated the patient, evaluated laboratory and imaging results, formulated the assessment and plan and placed orders. The Patient requires high complexity decision making with multiple systems involvement.  Rounds were done with the Respiratory Therapy Director and Staff therapists and discussed with nursing staff also.  [name], MD [facility] Pulmonary Critical Care Medicine Sleep Medicine

## 2020-11-17 DIAGNOSIS — Z86711 Personal history of pulmonary embolism: Secondary | ICD-10-CM | POA: Diagnosis not present

## 2020-11-17 DIAGNOSIS — U071 COVID-19: Secondary | ICD-10-CM | POA: Diagnosis not present

## 2020-11-17 DIAGNOSIS — J9621 Acute and chronic respiratory failure with hypoxia: Secondary | ICD-10-CM | POA: Diagnosis not present

## 2020-11-17 DIAGNOSIS — A419 Sepsis, unspecified organism: Secondary | ICD-10-CM | POA: Diagnosis not present

## 2020-11-17 LAB — SODIUM: Sodium: 153 mmol/L — ABNORMAL HIGH (ref 135–145)

## 2020-11-17 LAB — POTASSIUM: Potassium: 3.6 mmol/L (ref 3.5–5.1)

## 2020-11-17 NOTE — Progress Notes (Signed)
Pulmonary Critical Care Medicine [place]   PULMONARY CRITICAL CARE SERVICE  PROGRESS NOTE  Date of Service: 11/17/2020  Marie Chavez  SKA:[id number]  DOB: 1956/10/09   DOA: 09/02/2020  Referring Physician: Merton Border, MD  HPI: Marie Chavez is a 65 y.o. female seen for follow up of Acute on Chronic Respiratory Failure.  Patient is currently on T collar has been on 20% FiO2 she is at her baseline using the PMV  Medications: Reviewed on Rounds  Physical Exam:  Vitals: Temperature 97.7 pulse 81 respiratory rate 16 blood pressure is 163/83 saturations 99%  Ventilator Settings on T collar with an FiO2 of 28%  . General: Comfortable at this time . Eyes: Grossly normal lids, irises & conjunctiva . ENT: grossly tongue is normal . Neck: no obvious mass . Cardiovascular: S1 S2 normal no gallop . Respiratory: No rhonchi coarse breath sounds are noted . Abdomen: soft . Skin: no rash seen on limited exam . Musculoskeletal: not rigid . Psychiatric:unable to assess . Neurologic: no seizure no involuntary movements         Lab Data:   Basic Metabolic Panel: Recent Labs  Lab 11/11/20 0445 11/16/20 0426 11/16/20 1924  NA 146* 161* 157*  K 3.7 4.4  --   CL 101 114*  --   CO2 32 33*  --   GLUCOSE 145* 165*  --   BUN 57* 60*  --   CREATININE 0.54 0.64  --   CALCIUM 9.7 9.7  --     ABG: No results for input(s): PHART, PCO2ART, PO2ART, HCO3, O2SAT in the last 168 hours.  Liver Function Tests: No results for input(s): AST, ALT, ALKPHOS, BILITOT, PROT, ALBUMIN in the last 168 hours. No results for input(s): LIPASE, AMYLASE in the last 168 hours. No results for input(s): AMMONIA in the last 168 hours.  CBC: Recent Labs  Lab 11/16/20 0426  WBC 9.4  HGB 11.5*  HCT 41.4  MCV 107.5*  PLT 179    Cardiac Enzymes: No results for input(s): CKTOTAL, CKMB, CKMBINDEX, TROPONINI in the last 168 hours.  BNP (last 3 results) Recent Labs     09/09/20 0623  BNP 137.4*    ProBNP (last 3 results) No results for input(s): PROBNP in the last 8760 hours.  Radiological Exams: No results found.  Assessment/Plan Active Problems:   Acute on chronic respiratory failure with hypoxia ([place])   COVID-19 virus infection   Pneumonia due to COVID-19 virus   History of pulmonary embolism   Severe sepsis ([place])   1. Acute on chronic respiratory failure hypoxia we will continue with T collar titrate oxygen continue pulmonary toilet. 2. COVID-19 virus infection recovery phase we will continue to follow 3. History of pulmonary embolism no change 4. Severe sepsis treated resolved   I have personally seen and evaluated the patient, evaluated laboratory and imaging results, formulated the assessment and plan and placed orders. The Patient requires high complexity decision making with multiple systems involvement.  Rounds were done with the Respiratory Therapy Director and Staff therapists and discussed with nursing staff also.  [name], MD [facility] Pulmonary Critical Care Medicine Sleep Medicine

## 2020-11-18 DIAGNOSIS — Z86711 Personal history of pulmonary embolism: Secondary | ICD-10-CM | POA: Diagnosis not present

## 2020-11-18 DIAGNOSIS — A419 Sepsis, unspecified organism: Secondary | ICD-10-CM | POA: Diagnosis not present

## 2020-11-18 DIAGNOSIS — U071 COVID-19: Secondary | ICD-10-CM | POA: Diagnosis not present

## 2020-11-18 DIAGNOSIS — J9621 Acute and chronic respiratory failure with hypoxia: Secondary | ICD-10-CM | POA: Diagnosis not present

## 2020-11-18 LAB — CBC
HCT: 30.8 % — ABNORMAL LOW (ref 36.0–46.0)
Hemoglobin: 9.2 g/dL — ABNORMAL LOW (ref 12.0–15.0)
MCH: 30.3 pg (ref 26.0–34.0)
MCHC: 29.9 g/dL — ABNORMAL LOW (ref 30.0–36.0)
MCV: 101.3 fL — ABNORMAL HIGH (ref 80.0–100.0)
Platelets: 167 10*3/uL (ref 150–400)
RBC: 3.04 MIL/uL — ABNORMAL LOW (ref 3.87–5.11)
RDW: 19.4 % — ABNORMAL HIGH (ref 11.5–15.5)
WBC: 10.7 10*3/uL — ABNORMAL HIGH (ref 4.0–10.5)
nRBC: 1.9 % — ABNORMAL HIGH (ref 0.0–0.2)

## 2020-11-18 NOTE — Progress Notes (Signed)
Pulmonary Critical Care Medicine [place]   PULMONARY CRITICAL CARE SERVICE  PROGRESS NOTE  Date of Service: 11/18/2020  [name]  WRU:[id number]  DOB: May 29, 1956   DOA: 09/02/2020  Referring Physician: Merton Border, MD  HPI: Marie Chavez is a 65 y.o. female seen for follow up of Acute on Chronic Respiratory Failure.  Patient currently is off the ventilator on T collar she is at her baseline  Medications: Reviewed on Rounds  Physical Exam:  Vitals: Temperature is 97.0 pulse 75 respiratory rate is 14 blood pressure is 116/67 saturations 96%  Ventilator Settings on T collar FiO2 28%  . General: Comfortable at this time . Eyes: Grossly normal lids, irises & conjunctiva . ENT: grossly tongue is normal . Neck: no obvious mass . Cardiovascular: S1 S2 normal no gallop . Respiratory: No rhonchi coarse breath sounds . Abdomen: soft . Skin: no rash seen on limited exam . Musculoskeletal: not rigid . Psychiatric:unable to assess . Neurologic: no seizure no involuntary movements         Lab Data:   Basic Metabolic Panel: Recent Labs  Lab 11/16/20 0426 11/16/20 1924 11/17/20 1021  NA 161* 157* 153*  K 4.4  --  3.6  CL 114*  --   --   CO2 33*  --   --   GLUCOSE 165*  --   --   BUN 60*  --   --   CREATININE 0.64  --   --   CALCIUM 9.7  --   --     ABG: No results for input(s): PHART, PCO2ART, PO2ART, HCO3, O2SAT in the last 168 hours.  Liver Function Tests: No results for input(s): AST, ALT, ALKPHOS, BILITOT, PROT, ALBUMIN in the last 168 hours. No results for input(s): LIPASE, AMYLASE in the last 168 hours. No results for input(s): AMMONIA in the last 168 hours.  CBC: Recent Labs  Lab 11/16/20 0426 11/18/20 0509  WBC 9.4 10.7*  HGB 11.5* 9.2*  HCT 41.4 30.8*  MCV 107.5* 101.3*  PLT 179 167    Cardiac Enzymes: No results for input(s): CKTOTAL, CKMB, CKMBINDEX, TROPONINI in the last 168 hours.  BNP (last 3 results) Recent  Labs    09/09/20 0623  BNP 137.4*    ProBNP (last 3 results) No results for input(s): PROBNP in the last 8760 hours.  Radiological Exams: No results found.  Assessment/Plan Active Problems:   Acute on chronic respiratory failure with hypoxia ([place])   COVID-19 virus infection   Pneumonia due to COVID-19 virus   History of pulmonary embolism   Severe sepsis ([place])   1. Acute on chronic respiratory failure hypoxia we will continue with the T-piece patient is at her baseline 2. COVID-19 virus infection at baseline we will continue to follow along. 3. Pneumonia due to COVID-19 treated we will continue with supportive care 4. Pulmonary embolism treated 5. Severe sepsis resolved   I have personally seen and evaluated the patient, evaluated laboratory and imaging results, formulated the assessment and plan and placed orders. The Patient requires high complexity decision making with multiple systems involvement.  Rounds were done with the Respiratory Therapy Director and Staff therapists and discussed with nursing staff also.  [name], MD [facility] Pulmonary Critical Care Medicine Sleep Medicine

## 2020-11-19 LAB — BASIC METABOLIC PANEL WITH GFR
Anion gap: 10 (ref 5–15)
BUN: 42 mg/dL — ABNORMAL HIGH (ref 8–23)
CO2: 33 mmol/L — ABNORMAL HIGH (ref 22–32)
Calcium: 9.3 mg/dL (ref 8.9–10.3)
Chloride: 104 mmol/L (ref 98–111)
Creatinine, Ser: 0.48 mg/dL (ref 0.44–1.00)
GFR, Estimated: >60 mL/min
Glucose, Bld: 123 mg/dL — ABNORMAL HIGH (ref 70–99)
Potassium: 3.4 mmol/L — ABNORMAL LOW (ref 3.5–5.1)
Sodium: 147 mmol/L — ABNORMAL HIGH (ref 135–145)

## 2020-11-20 DIAGNOSIS — U071 COVID-19: Secondary | ICD-10-CM | POA: Diagnosis not present

## 2020-11-20 DIAGNOSIS — A419 Sepsis, unspecified organism: Secondary | ICD-10-CM | POA: Diagnosis not present

## 2020-11-20 DIAGNOSIS — Z86711 Personal history of pulmonary embolism: Secondary | ICD-10-CM | POA: Diagnosis not present

## 2020-11-20 DIAGNOSIS — J9621 Acute and chronic respiratory failure with hypoxia: Secondary | ICD-10-CM | POA: Diagnosis not present

## 2020-11-20 LAB — POTASSIUM: Potassium: 3.5 mmol/L (ref 3.5–5.1)

## 2020-11-20 NOTE — Progress Notes (Signed)
Pulmonary Critical Care Medicine [place]   PULMONARY CRITICAL CARE SERVICE  PROGRESS NOTE  Date of Service: 11/20/2020  Marie Chavez  DQQ:[id number]  DOB: 06-09-1956   DOA: 09/02/2020  Referring Physician: Merton Border, MD  HPI: Marie Chavez is a 65 y.o. female seen for follow up of Acute on Chronic Respiratory Failure.  Patient currently is on T collar she is at her baseline and 28% FiO2  Medications: Reviewed on Rounds  Physical Exam:  Vitals: Temperature is 98.2 pulse 75 respiratory rate is 20 blood pressure is 121/71 saturations 100%  Ventilator Settings on T-piece FiO2 28%  . General: Comfortable at this time . Eyes: Grossly normal lids, irises & conjunctiva . ENT: grossly tongue is normal . Neck: no obvious mass . Cardiovascular: S1 S2 normal no gallop . Respiratory: Scattered rhonchi expansion is equal . Abdomen: soft . Skin: no rash seen on limited exam . Musculoskeletal: not rigid . Psychiatric:unable to assess . Neurologic: no seizure no involuntary movements         Lab Data:   Basic Metabolic Panel: Recent Labs  Lab 11/16/20 0426 11/16/20 1924 11/17/20 1021 11/19/20 0421  NA 161* 157* 153* 147*  K 4.4  --  3.6 3.4*  CL 114*  --   --  104  CO2 33*  --   --  33*  GLUCOSE 165*  --   --  123*  BUN 60*  --   --  42*  CREATININE 0.64  --   --  0.48  CALCIUM 9.7  --   --  9.3    ABG: No results for input(s): PHART, PCO2ART, PO2ART, HCO3, O2SAT in the last 168 hours.  Liver Function Tests: No results for input(s): AST, ALT, ALKPHOS, BILITOT, PROT, ALBUMIN in the last 168 hours. No results for input(s): LIPASE, AMYLASE in the last 168 hours. No results for input(s): AMMONIA in the last 168 hours.  CBC: Recent Labs  Lab 11/16/20 0426 11/18/20 0509  WBC 9.4 10.7*  HGB 11.5* 9.2*  HCT 41.4 30.8*  MCV 107.5* 101.3*  PLT 179 167    Cardiac Enzymes: No results for input(s): CKTOTAL, CKMB, CKMBINDEX, TROPONINI in the  last 168 hours.  BNP (last 3 results) Recent Labs    09/09/20 0623  BNP 137.4*    ProBNP (last 3 results) No results for input(s): PROBNP in the last 8760 hours.  Radiological Exams: No results found.  Assessment/Plan Active Problems:   Acute on chronic respiratory failure with hypoxia ([place])   COVID-19 virus infection   Pneumonia due to COVID-19 virus   History of pulmonary embolism   Severe sepsis ([place])   1. Acute on chronic respiratory failure hypoxia we will continue with T collar as ordered continue secretion management supportive care 2. COVID-19 virus infection in resolution 3. Pneumonia due to COVID-19 treated 4. History of pulmonary embolism been on anticoagulation 5. Severe sepsis treated continue with supportive care   I have personally seen and evaluated the patient, evaluated laboratory and imaging results, formulated the assessment and plan and placed orders. The Patient requires high complexity decision making with multiple systems involvement.  Rounds were done with the Respiratory Therapy Director and Staff therapists and discussed with nursing staff also.  [name], MD [facility] Pulmonary Critical Care Medicine Sleep Medicine

## 2020-11-21 LAB — BASIC METABOLIC PANEL
Anion gap: 16 — ABNORMAL HIGH (ref 5–15)
BUN: 43 mg/dL — ABNORMAL HIGH (ref 8–23)
CO2: 27 mmol/L (ref 22–32)
Calcium: 9.5 mg/dL (ref 8.9–10.3)
Chloride: 105 mmol/L (ref 98–111)
Creatinine, Ser: 0.53 mg/dL (ref 0.44–1.00)
GFR, Estimated: >60 mL/min (ref >60)
Glucose, Bld: 110 mg/dL — ABNORMAL HIGH (ref 70–99)
Potassium: 5.4 mmol/L — ABNORMAL HIGH (ref 3.5–5.1)
Sodium: 148 mmol/L — ABNORMAL HIGH (ref 135–145)

## 2020-11-25 DEATH — deceased
